# Patient Record
Sex: Male | Born: 1950 | Race: White | Hispanic: No | Marital: Married | Smoking: Former smoker
Health system: Southern US, Community
[De-identification: ages and names within clinical notes are randomized; demographics above are authoritative.]

## PROBLEM LIST (undated history)

## (undated) DIAGNOSIS — I10 Essential (primary) hypertension: Secondary | ICD-10-CM

## (undated) DIAGNOSIS — G610 Guillain-Barre syndrome: Secondary | ICD-10-CM

## (undated) DIAGNOSIS — Z8719 Personal history of other diseases of the digestive system: Secondary | ICD-10-CM

## (undated) DIAGNOSIS — K219 Gastro-esophageal reflux disease without esophagitis: Secondary | ICD-10-CM

## (undated) DIAGNOSIS — I499 Cardiac arrhythmia, unspecified: Secondary | ICD-10-CM

## (undated) DIAGNOSIS — M1990 Unspecified osteoarthritis, unspecified site: Secondary | ICD-10-CM

## (undated) DIAGNOSIS — J302 Other seasonal allergic rhinitis: Secondary | ICD-10-CM

## (undated) HISTORY — PX: APPENDECTOMY: SHX54

## (undated) HISTORY — PX: CERVICAL FUSION: SHX112

---

## 2013-04-07 ENCOUNTER — Ambulatory Visit: Payer: 59 | Attending: *Deleted | Admitting: Physical Therapy

## 2013-04-07 DIAGNOSIS — R5381 Other malaise: Secondary | ICD-10-CM | POA: Insufficient documentation

## 2013-04-07 DIAGNOSIS — R293 Abnormal posture: Secondary | ICD-10-CM | POA: Insufficient documentation

## 2013-04-07 DIAGNOSIS — IMO0001 Reserved for inherently not codable concepts without codable children: Secondary | ICD-10-CM | POA: Insufficient documentation

## 2013-04-07 DIAGNOSIS — Z981 Arthrodesis status: Secondary | ICD-10-CM | POA: Insufficient documentation

## 2013-04-10 ENCOUNTER — Encounter: Payer: 59 | Admitting: Physical Therapy

## 2013-04-17 ENCOUNTER — Other Ambulatory Visit: Payer: Self-pay | Admitting: Neurosurgery

## 2013-04-17 ENCOUNTER — Encounter (HOSPITAL_COMMUNITY): Payer: Self-pay | Admitting: Pharmacy Technician

## 2013-04-21 ENCOUNTER — Encounter (HOSPITAL_COMMUNITY)
Admission: RE | Admit: 2013-04-21 | Discharge: 2013-04-21 | Disposition: A | Discharge status: Home / Self Care | Payer: 59 | Source: Ambulatory Visit | Attending: Neurosurgery | Admitting: Neurosurgery

## 2013-04-21 ENCOUNTER — Encounter (HOSPITAL_COMMUNITY): Payer: Self-pay

## 2013-04-21 HISTORY — DX: Other seasonal allergic rhinitis: J30.2

## 2013-04-21 HISTORY — DX: Guillain-Barre syndrome: G61.0

## 2013-04-21 HISTORY — DX: Gastro-esophageal reflux disease without esophagitis: K21.9

## 2013-04-21 HISTORY — DX: Unspecified osteoarthritis, unspecified site: M19.90

## 2013-04-21 LAB — SURGICAL PCR SCREEN: Staphylococcus aureus: POSITIVE — AB

## 2013-04-21 LAB — CBC
HCT: 43.2 % (ref 39.0–52.0)
Hemoglobin: 15.2 g/dL (ref 13.0–17.0)
MCHC: 35.2 g/dL (ref 30.0–36.0)
MCV: 81.2 fL (ref 78.0–100.0)

## 2013-04-21 LAB — BASIC METABOLIC PANEL
BUN: 16 mg/dL (ref 6–23)
Creatinine, Ser: 1.16 mg/dL (ref 0.50–1.35)
GFR calc non Af Amer: 67 mL/min — ABNORMAL LOW (ref >90)
Glucose, Bld: 113 mg/dL — ABNORMAL HIGH (ref 70–99)
Potassium: 3.7 mEq/L (ref 3.5–5.1)

## 2013-04-21 NOTE — Progress Notes (Signed)
04/21/13 1122  OBSTRUCTIVE SLEEP APNEA  Score 4 or greater  Results sent to PCP   This patient has screened at an elevated risk for obstructive sleep apnea using the STOP Bang tool during a pre-surgical visit. A score of 4 or greater is an elevated risk for sleep apnea.

## 2013-04-21 NOTE — Pre-Procedure Instructions (Signed)
Homer Miller  04/21/2013   Your procedure is scheduled on:  Wednesday Apr 23, 2013.  Report to [facility] 3rd Floor at 11:00 AM.  Call this number if you have problems the morning of surgery: [phone]   Remember:   Do not eat food or drink liquids after midnight.   Take these medicines the morning of surgery with A SIP OF WATER: Hydrocodone (Vicodin) if needed for pain, Omeprazole (Prilosec), Loratadine (Claritin)   Do not wear jewelry  Do not wear lotions or colognes.  Men may shave face and neck.  Do not bring valuables to the hospital.  Contacts, dentures or bridgework may not be worn into surgery.  Leave suitcase in the car. After surgery it may be brought to your room.  For patients admitted to the hospital, checkout time is 11:00 AM the day of discharge.   Patients discharged the day of surgery will not be allowed to drive home.  Name and phone number of your driver: Family/Friend  Special Instructions: Shower using CHG 2 nights before surgery and the night before surgery.  If you shower the day of surgery use CHG.  Use special wash - you have one bottle of CHG for all showers.  You should use approximately 1/3 of the bottle for each shower.   Please read over the following fact sheets that you were given: Pain Booklet, Coughing and Deep Breathing, MRSA Information and Surgical Site Infection Prevention

## 2013-04-21 NOTE — Progress Notes (Signed)
Nurse called [facility] and spoke with Erie Noe and informed her that orders were under signed and held, and that the patient had a penicillin allergy and had a rash the last time it was taken. Erie Noe said that antibiotic needed to be changed to 1g Vancomycin and she would give the message to Dr. Jeral Fruit.

## 2013-04-23 ENCOUNTER — Inpatient Hospital Stay (HOSPITAL_COMMUNITY)
Admission: RE | Admit: 2013-04-23 | Discharge: 2013-04-26 | DRG: 460 | Disposition: A | Discharge status: Home / Self Care | Payer: 59 | Source: Ambulatory Visit | Attending: Neurosurgery | Admitting: Neurosurgery

## 2013-04-23 ENCOUNTER — Encounter (HOSPITAL_COMMUNITY): Payer: Self-pay | Admitting: Certified Registered"

## 2013-04-23 ENCOUNTER — Encounter (HOSPITAL_COMMUNITY)
Admission: RE | Disposition: A | Discharge status: Home / Self Care | Payer: Self-pay | Source: Ambulatory Visit | Attending: Neurosurgery

## 2013-04-23 ENCOUNTER — Encounter (HOSPITAL_COMMUNITY): Payer: Self-pay | Admitting: *Deleted

## 2013-04-23 ENCOUNTER — Ambulatory Visit (HOSPITAL_COMMUNITY): Payer: 59

## 2013-04-23 ENCOUNTER — Ambulatory Visit (HOSPITAL_COMMUNITY): Payer: 59 | Admitting: Certified Registered"

## 2013-04-23 DIAGNOSIS — G610 Guillain-Barre syndrome: Secondary | ICD-10-CM | POA: Diagnosis present

## 2013-04-23 DIAGNOSIS — M47812 Spondylosis without myelopathy or radiculopathy, cervical region: Principal | ICD-10-CM | POA: Diagnosis present

## 2013-04-23 DIAGNOSIS — K219 Gastro-esophageal reflux disease without esophagitis: Secondary | ICD-10-CM | POA: Diagnosis present

## 2013-04-23 DIAGNOSIS — Z87891 Personal history of nicotine dependence: Secondary | ICD-10-CM

## 2013-04-23 DIAGNOSIS — G950 Syringomyelia and syringobulbia: Secondary | ICD-10-CM | POA: Diagnosis present

## 2013-04-23 HISTORY — PX: ANTERIOR CERVICAL DECOMP/DISCECTOMY FUSION: SHX1161

## 2013-04-23 SURGERY — ANTERIOR CERVICAL DECOMPRESSION/DISCECTOMY FUSION 3 LEVELS
Anesthesia: General | Site: Neck | Wound class: Clean

## 2013-04-23 MED ORDER — HYDROMORPHONE HCL PF 1 MG/ML IJ SOLN
0.2500 mg | INTRAMUSCULAR | Status: DC | PRN
  Administered 2013-04-23: 0.5 mg via INTRAVENOUS

## 2013-04-23 MED ORDER — ACETAMINOPHEN 650 MG RE SUPP: 650.0000 mg | RECTAL | Status: DC | PRN

## 2013-04-23 MED ORDER — MIDAZOLAM HCL 5 MG/5ML IJ SOLN
INTRAMUSCULAR | Status: DC | PRN
  Administered 2013-04-23: 2 mg via INTRAVENOUS

## 2013-04-23 MED ORDER — VANCOMYCIN HCL IN DEXTROSE 1-5 GM/200ML-% IV SOLN
INTRAVENOUS | Status: AC
  Administered 2013-04-23: 1000 mg via INTRAVENOUS
  Filled 2013-04-23: qty 200

## 2013-04-23 MED ORDER — HYDROMORPHONE HCL PF 1 MG/ML IJ SOLN
INTRAMUSCULAR | Status: AC
  Administered 2013-04-23: 0.5 mg via INTRAVENOUS
  Filled 2013-04-23: qty 1

## 2013-04-23 MED ORDER — ROCURONIUM BROMIDE 100 MG/10ML IV SOLN
INTRAVENOUS | Status: DC | PRN
  Administered 2013-04-23: 50 mg via INTRAVENOUS

## 2013-04-23 MED ORDER — THROMBIN 5000 UNITS EX SOLR
OROMUCOSAL | Status: DC | PRN
  Administered 2013-04-23 (×2): via TOPICAL

## 2013-04-23 MED ORDER — PHENYLEPHRINE HCL 10 MG/ML IJ SOLN
INTRAMUSCULAR | Status: DC | PRN
  Administered 2013-04-23: 40 ug via INTRAVENOUS
  Administered 2013-04-23: 80 ug via INTRAVENOUS

## 2013-04-23 MED ORDER — SUFENTANIL CITRATE 50 MCG/ML IV SOLN
INTRAVENOUS | Status: DC | PRN
  Administered 2013-04-23: 10 ug via INTRAVENOUS
  Administered 2013-04-23: 20 ug via INTRAVENOUS
  Administered 2013-04-23: 10 ug via INTRAVENOUS

## 2013-04-23 MED ORDER — PHENOL 1.4 % MT LIQD: 1.0000 | OROMUCOSAL | Status: DC | PRN

## 2013-04-23 MED ORDER — DEXAMETHASONE 4 MG PO TABS
4.0000 mg | ORAL_TABLET | Freq: Four times a day (QID) | ORAL | Status: DC
  Administered 2013-04-24 – 2013-04-26 (×7): 4 mg via ORAL
  Filled 2013-04-23 (×15): qty 1

## 2013-04-23 MED ORDER — ZOLPIDEM TARTRATE 5 MG PO TABS: 5.0000 mg | ORAL_TABLET | Freq: Every evening | ORAL | Status: DC | PRN

## 2013-04-23 MED ORDER — OXYCODONE HCL 5 MG/5ML PO SOLN: 5.0000 mg | Freq: Once | ORAL | Status: DC | PRN

## 2013-04-23 MED ORDER — SODIUM CHLORIDE 0.9 % IJ SOLN
3.0000 mL | Freq: Two times a day (BID) | INTRAMUSCULAR | Status: DC
  Administered 2013-04-23 – 2013-04-26 (×4): 3 mL via INTRAVENOUS

## 2013-04-23 MED ORDER — LACTATED RINGERS IV SOLN
INTRAVENOUS | Status: DC | PRN
  Administered 2013-04-23 (×2): via INTRAVENOUS

## 2013-04-23 MED ORDER — NAPHAZOLINE-PHENIRAMINE 0.025-0.3 % OP SOLN
1.0000 [drp] | Freq: Every day | OPHTHALMIC | Status: DC | PRN
  Filled 2013-04-23: qty 15

## 2013-04-23 MED ORDER — OXYCODONE-ACETAMINOPHEN 5-325 MG PO TABS
1.0000 | ORAL_TABLET | ORAL | Status: DC | PRN
  Administered 2013-04-24 – 2013-04-26 (×11): 2 via ORAL
  Filled 2013-04-23 (×12): qty 2

## 2013-04-23 MED ORDER — VANCOMYCIN HCL IN DEXTROSE 1-5 GM/200ML-% IV SOLN
1000.0000 mg | Freq: Two times a day (BID) | INTRAVENOUS | Status: DC
  Administered 2013-04-24 – 2013-04-26 (×5): 1000 mg via INTRAVENOUS
  Filled 2013-04-23 (×7): qty 200

## 2013-04-23 MED ORDER — SODIUM CHLORIDE 0.9 % IV SOLN
INTRAVENOUS | Status: DC
  Administered 2013-04-23: 22:00:00 via INTRAVENOUS

## 2013-04-23 MED ORDER — ALUM & MAG HYDROXIDE-SIMETH 200-200-20 MG/5ML PO SUSP: 30.0000 mL | Freq: Four times a day (QID) | ORAL | Status: DC | PRN

## 2013-04-23 MED ORDER — SODIUM CHLORIDE 0.9 % IJ SOLN: 3.0000 mL | INTRAMUSCULAR | Status: DC | PRN

## 2013-04-23 MED ORDER — VECURONIUM BROMIDE 10 MG IV SOLR
INTRAVENOUS | Status: DC | PRN
  Administered 2013-04-23 (×2): 1 mg via INTRAVENOUS
  Administered 2013-04-23: 2 mg via INTRAVENOUS

## 2013-04-23 MED ORDER — ONDANSETRON HCL 4 MG/2ML IJ SOLN
INTRAMUSCULAR | Status: DC | PRN
  Administered 2013-04-23: 4 mg via INTRAVENOUS

## 2013-04-23 MED ORDER — MENTHOL 3 MG MT LOZG
1.0000 | LOZENGE | OROMUCOSAL | Status: DC | PRN
  Administered 2013-04-24: 3 mg via ORAL
  Filled 2013-04-23: qty 9

## 2013-04-23 MED ORDER — LIDOCAINE HCL (CARDIAC) 20 MG/ML IV SOLN
INTRAVENOUS | Status: DC | PRN
  Administered 2013-04-23: 50 mg via INTRAVENOUS

## 2013-04-23 MED ORDER — LORATADINE 10 MG PO TABS
10.0000 mg | ORAL_TABLET | Freq: Every day | ORAL | Status: DC
  Administered 2013-04-26: 10 mg via ORAL
  Filled 2013-04-23 (×4): qty 1

## 2013-04-23 MED ORDER — SODIUM CHLORIDE 0.9 % IV SOLN: 250.0000 mL | INTRAVENOUS | Status: DC

## 2013-04-23 MED ORDER — THROMBIN 20000 UNITS EX SOLR
CUTANEOUS | Status: DC | PRN
  Administered 2013-04-23: 14:00:00 via TOPICAL

## 2013-04-23 MED ORDER — CEFAZOLIN SODIUM 1-5 GM-% IV SOLN
1.0000 g | Freq: Three times a day (TID) | INTRAVENOUS | Status: AC
  Administered 2013-04-23 – 2013-04-24 (×2): 1 g via INTRAVENOUS
  Filled 2013-04-23 (×2): qty 50

## 2013-04-23 MED ORDER — DIAZEPAM 5 MG PO TABS: 5.0000 mg | ORAL_TABLET | Freq: Four times a day (QID) | ORAL | Status: DC | PRN

## 2013-04-23 MED ORDER — ONDANSETRON HCL 4 MG/2ML IJ SOLN: 4.0000 mg | INTRAMUSCULAR | Status: DC | PRN

## 2013-04-23 MED ORDER — DEXAMETHASONE SODIUM PHOSPHATE 4 MG/ML IJ SOLN
4.0000 mg | Freq: Four times a day (QID) | INTRAMUSCULAR | Status: DC
  Administered 2013-04-23 – 2013-04-25 (×5): 4 mg via INTRAVENOUS
  Filled 2013-04-23 (×12): qty 1

## 2013-04-23 MED ORDER — MORPHINE SULFATE 2 MG/ML IJ SOLN
1.0000 mg | INTRAMUSCULAR | Status: DC | PRN
  Administered 2013-04-23: 4 mg via INTRAVENOUS
  Administered 2013-04-24: 2 mg via INTRAVENOUS
  Administered 2013-04-24: 4 mg via INTRAVENOUS
  Filled 2013-04-23: qty 2
  Filled 2013-04-23: qty 1
  Filled 2013-04-23: qty 2

## 2013-04-23 MED ORDER — 0.9 % SODIUM CHLORIDE (POUR BTL) OPTIME
TOPICAL | Status: DC | PRN
  Administered 2013-04-23: 1000 mL

## 2013-04-23 MED ORDER — NEOSTIGMINE METHYLSULFATE 1 MG/ML IJ SOLN
INTRAMUSCULAR | Status: DC | PRN
  Administered 2013-04-23: 3 mg via INTRAVENOUS

## 2013-04-23 MED ORDER — PROPOFOL 10 MG/ML IV BOLUS
INTRAVENOUS | Status: DC | PRN
  Administered 2013-04-23: 60 mg via INTRAVENOUS
  Administered 2013-04-23: 140 mg via INTRAVENOUS

## 2013-04-23 MED ORDER — ACETAMINOPHEN 325 MG PO TABS: 650.0000 mg | ORAL_TABLET | ORAL | Status: DC | PRN

## 2013-04-23 MED ORDER — GLYCOPYRROLATE 0.2 MG/ML IJ SOLN
INTRAMUSCULAR | Status: DC | PRN
  Administered 2013-04-23: 0.4 mg via INTRAVENOUS

## 2013-04-23 MED ORDER — OXYCODONE HCL 5 MG PO TABS: 5.0000 mg | ORAL_TABLET | Freq: Once | ORAL | Status: DC | PRN

## 2013-04-23 SURGICAL SUPPLY — 70 items
BANDAGE GAUZE ELAST BULKY 4 IN (GAUZE/BANDAGES/DRESSINGS) ×4 IMPLANT
BENZOIN TINCTURE PRP APPL 2/3 (GAUZE/BANDAGES/DRESSINGS) ×2 IMPLANT
BLADE ULTRA TIP 2M (BLADE) ×2 IMPLANT
BUR BARREL STRAIGHT FLUTE 4.0 (BURR) ×2 IMPLANT
BUR MATCHSTICK NEURO 3.0 LAGG (BURR) ×2 IMPLANT
CANISTER SUCTION 2500CC (MISCELLANEOUS) ×2 IMPLANT
CLOTH BEACON ORANGE TIMEOUT ST (SAFETY) ×2 IMPLANT
CONT SPEC 4OZ CLIKSEAL STRL BL (MISCELLANEOUS) ×2 IMPLANT
COVER MAYO STAND STRL (DRAPES) ×2 IMPLANT
DRAPE C-ARM 42X72 X-RAY (DRAPES) ×4 IMPLANT
DRAPE LAPAROTOMY 100X72 PEDS (DRAPES) ×2 IMPLANT
DRAPE MICROSCOPE LEICA (MISCELLANEOUS) ×2 IMPLANT
DRAPE POUCH INSTRU U-SHP 10X18 (DRAPES) ×2 IMPLANT
DRAPE PROXIMA HALF (DRAPES) ×2 IMPLANT
DURAPREP 6ML APPLICATOR 50/CS (WOUND CARE) ×2 IMPLANT
ELECT REM PT RETURN 9FT ADLT (ELECTROSURGICAL) ×2
ELECTRODE REM PT RTRN 9FT ADLT (ELECTROSURGICAL) ×1 IMPLANT
EVACUATOR 3/16  PVC DRAIN (DRAIN) ×1
EVACUATOR 3/16 PVC DRAIN (DRAIN) ×1 IMPLANT
GAUZE SPONGE 4X4 16PLY XRAY LF (GAUZE/BANDAGES/DRESSINGS) IMPLANT
GLOVE BIO SURGEON STRL SZ 6.5 (GLOVE) IMPLANT
GLOVE BIO SURGEON STRL SZ7 (GLOVE) IMPLANT
GLOVE BIO SURGEON STRL SZ7.5 (GLOVE) IMPLANT
GLOVE BIO SURGEON STRL SZ8 (GLOVE) IMPLANT
GLOVE BIO SURGEON STRL SZ8.5 (GLOVE) IMPLANT
GLOVE BIOGEL M 8.0 STRL (GLOVE) ×4 IMPLANT
GLOVE BIOGEL PI IND STRL 7.0 (GLOVE) ×1 IMPLANT
GLOVE BIOGEL PI INDICATOR 7.0 (GLOVE) ×1
GLOVE ECLIPSE 6.5 STRL STRAW (GLOVE) IMPLANT
GLOVE ECLIPSE 7.0 STRL STRAW (GLOVE) IMPLANT
GLOVE ECLIPSE 7.5 STRL STRAW (GLOVE) IMPLANT
GLOVE ECLIPSE 8.0 STRL XLNG CF (GLOVE) IMPLANT
GLOVE ECLIPSE 8.5 STRL (GLOVE) IMPLANT
GLOVE EXAM NITRILE LRG STRL (GLOVE) IMPLANT
GLOVE EXAM NITRILE MD LF STRL (GLOVE) IMPLANT
GLOVE EXAM NITRILE XL STR (GLOVE) IMPLANT
GLOVE EXAM NITRILE XS STR PU (GLOVE) IMPLANT
GLOVE INDICATOR 6.5 STRL GRN (GLOVE) IMPLANT
GLOVE INDICATOR 7.0 STRL GRN (GLOVE) IMPLANT
GLOVE INDICATOR 7.5 STRL GRN (GLOVE) IMPLANT
GLOVE INDICATOR 8.0 STRL GRN (GLOVE) IMPLANT
GLOVE INDICATOR 8.5 STRL (GLOVE) IMPLANT
GLOVE OPTIFIT SS 8.0 STRL (GLOVE) IMPLANT
GLOVE SURG SS PI 6.5 STRL IVOR (GLOVE) IMPLANT
GLOVE SURG SS PI 7.0 STRL IVOR (GLOVE) ×6 IMPLANT
GOWN BRE IMP SLV AUR LG STRL (GOWN DISPOSABLE) IMPLANT
GOWN BRE IMP SLV AUR XL STRL (GOWN DISPOSABLE) ×6 IMPLANT
GOWN STRL REIN 2XL LVL4 (GOWN DISPOSABLE) IMPLANT
HEAD HALTER (SOFTGOODS) ×2 IMPLANT
HEMOSTAT POWDER KIT SURGIFOAM (HEMOSTASIS) ×4 IMPLANT
KIT BASIN OR (CUSTOM PROCEDURE TRAY) ×2 IMPLANT
KIT ROOM TURNOVER OR (KITS) ×2 IMPLANT
NEEDLE SPNL 22GX3.5 QUINCKE BK (NEEDLE) ×4 IMPLANT
NS IRRIG 1000ML POUR BTL (IV SOLUTION) ×2 IMPLANT
PACK LAMINECTOMY NEURO (CUSTOM PROCEDURE TRAY) ×2 IMPLANT
PATTIES SURGICAL .5 X1 (DISPOSABLE) ×2 IMPLANT
PLATE ANT CERV XTEND 3 LV 54 (Plate) ×2 IMPLANT
RUBBERBAND STERILE (MISCELLANEOUS) ×4 IMPLANT
SCREW XTD VAR 4.2 SELF TAP 12 (Screw) ×12 IMPLANT
SPACER ACF 7MM (Bone Implant) ×6 IMPLANT
SPONGE GAUZE 4X4 12PLY (GAUZE/BANDAGES/DRESSINGS) ×2 IMPLANT
SPONGE INTESTINAL PEANUT (DISPOSABLE) ×4 IMPLANT
SPONGE SURGIFOAM ABS GEL 100 (HEMOSTASIS) ×2 IMPLANT
STRIP CLOSURE SKIN 1/2X4 (GAUZE/BANDAGES/DRESSINGS) ×2 IMPLANT
SUT VIC AB 3-0 SH 8-18 (SUTURE) ×4 IMPLANT
SYR 20ML ECCENTRIC (SYRINGE) ×2 IMPLANT
TAPE CLOTH SURG 4X10 WHT LF (GAUZE/BANDAGES/DRESSINGS) ×2 IMPLANT
TOWEL OR 17X24 6PK STRL BLUE (TOWEL DISPOSABLE) ×2 IMPLANT
TOWEL OR 17X26 10 PK STRL BLUE (TOWEL DISPOSABLE) ×2 IMPLANT
WATER STERILE IRR 1000ML POUR (IV SOLUTION) ×2 IMPLANT

## 2013-04-23 NOTE — Anesthesia Postprocedure Evaluation (Signed)
  Anesthesia Post-op Note  Patient: Kennan Detter  Procedure(s) Performed: Procedure(s) with comments: ANTERIOR CERVICAL DECOMPRESSION/DISCECTOMY FUSION 3 LEVELS (N/A) - Cervical three-four, cervical four-five, cervical five-six Anterior cervical decompression/diskectomy/fusion  Patient Location: PACU  Anesthesia Type:General  Level of Consciousness: awake, alert , oriented and patient cooperative  Airway and Oxygen Therapy: Patient Spontanous Breathing and Patient connected to nasal cannula oxygen  Post-op Pain: mild  Post-op Assessment: Post-op Vital signs reviewed, Patient's Cardiovascular Status Stable, Respiratory Function Stable, Patent Airway, No signs of Nausea or vomiting and Pain level controlled  Post-op Vital Signs: Reviewed and stable  Complications: No apparent anesthesia complications

## 2013-04-23 NOTE — H&P (Signed)
Bradley Trujillo is an 62 y.o. male.   Chief Complaint: tingling sensation in both arms while at work HPI: patient with a history of sudden onset of burning sensation in both upper extremities while he was working while looking up. Now he notices than everytime he hyperextends his neck he develops tingling feeligs which gets better with flexion the cervical spine.   Past Medical History  Diagnosis Date  . Arthritis   . Seasonal allergies   . GERD (gastroesophageal reflux disease)   . Guillain-Barre syndrome     Past Surgical History  Procedure Laterality Date  . Cervical fusion      C7  . Appendectomy      History reviewed. No pertinent family history. Social History:  reports that he has quit smoking. He does not have any smokeless tobacco history on file. He reports that he drinks about 0.6 ounces of alcohol per week. He reports that he does not use illicit drugs.  Allergies:  Allergies  Allergen Reactions  . Penicillins Rash    Medications Prior to Admission  Medication Sig Dispense Refill  . celecoxib (CELEBREX) 200 MG capsule Take 200 mg by mouth daily.      . Cholecalciferol (VITAMIN D3) 2000 UNITS capsule Take 2,000 Units by mouth daily.      Marland Kitchen HYDROcodone-acetaminophen (VICODIN) 5-500 MG per tablet Take 0.5 tablets by mouth 2 (two) times daily as needed for pain. For pain      . loratadine (CLARITIN) 10 MG tablet Take 10 mg by mouth daily.      . Multiple Vitamin (MULTIVITAMIN WITH MINERALS) TABS Take 1 tablet by mouth daily.      . naphazoline-pheniramine (NAPHCON-A) 0.025-0.3 % ophthalmic solution Place 1 drop into both eyes daily as needed. For irritated eyes      . omeprazole (PRILOSEC) 20 MG capsule Take 20 mg by mouth daily.      Marland Kitchen oxymetazoline (DRISTAN SPRAY) 0.05 % nasal spray Place 1 spray into the nose 2 (two) times daily as needed for congestion. For nasal congestion        No results found for this or any previous visit (from the past 48 hour(s)). No results  found.  Review of Systems  Constitutional: Negative.   HENT: Negative.   Eyes: Negative.   Respiratory: Negative.   Cardiovascular: Negative.   Gastrointestinal: Negative.   Genitourinary: Negative.   Musculoskeletal: Negative.   Skin: Negative.   Neurological: Positive for focal weakness.  Endo/Heme/Allergies: Negative.   Psychiatric/Behavioral: Negative.     Blood pressure 139/97, pulse 89, temperature 97.9 F (36.6 C), temperature source Oral, resp. rate 20, SpO2 97.00%. Physical Exam hent, nl. Neck, he has an anterior scar from previous fusin at c6-7. Cv, nl. Lungs, clear. Abdomen, soft. Extremities, atrophy of both lower extremities secondary to Guilliam-Brre syndrome. Braces in both.NEURO  Weakness of both biceps and deltoids, atrophy of hypotenar muscle on the left. Dtr, nl. Mri shows gliosis at c4-5, stenosis from c3 to c5, fusion at 67  Assessment/Plan Decompression and fusion at c3 to 6. He is aware of therisks such [place] no improvement, worsening of the weakness, csf leak, hematoma, damage to the cord and esophagus. Also aware of the possibility of posterior decompression at a later day Jalal Rauch M 04/23/2013, 1:40 PM

## 2013-04-23 NOTE — Progress Notes (Signed)
Op note [phone]

## 2013-04-23 NOTE — Preoperative (Signed)
Beta Blockers   Reason not to administer Beta Blockers:Not Applicable 

## 2013-04-23 NOTE — Progress Notes (Signed)
Patient states he needs to have shoes with braces built in before he can ambulate due to foot drop. Shoes and braces are at bedside.

## 2013-04-23 NOTE — Progress Notes (Signed)
ANTIBIOTIC CONSULT NOTE - INITIAL  Pharmacy Consult for Vancomycin Indication: Surgical prophylaxis  Allergies  Allergen Reactions  . Penicillins Rash    Patient Measurements: Height: 5' 6.93" (170 cm) Weight: 178 lb 5.6 oz (80.9 kg) IBW/kg (Calculated) : 65.94   Vital Signs: Temp: 98.5 F (36.9 C) (05/07 2034) Temp src: Oral (05/07 2034) BP: 132/81 mmHg (05/07 2034) Pulse Rate: 104 (05/07 2034)   Recent Labs  04/21/13 1136  WBC 5.5  HGB 15.2  PLT 201  CREATININE 1.16   Estimated Creatinine Clearance: 68.9 ml/min (by C-G formula based on Cr of 1.16).   Medical History: Past Medical History  Diagnosis Date  . Arthritis   . Seasonal allergies   . GERD (gastroesophageal reflux disease)   . Guillain-Barre syndrome      Assessment: 62yo male being continued on IV vancomycin for post-op surgical prophylaxis s/p anterior cervical decompression/ discectomy fusion 3 levels earlier today. Received Vancomycin 1000mg  x 1 this afternoon. Wt 80.9 kg, CrCl ~60ml/min, WBC 5.5, Tmax 100'F.    Plan:  1) Vancomycin 1000mg  IV Q12 hours 2) F/u renal function, clinical status, LOT, Vancomycin trough at steady state as appropriate   Benjaman Pott, PharmD, BCPS 04/23/2013   8:57 PM

## 2013-04-23 NOTE — Anesthesia Procedure Notes (Addendum)
Procedure Name: Intubation Date/Time: 04/23/2013 2:44 PM Performed by: Charm Barges, Tationna Fullard R Pre-anesthesia Checklist: Patient identified, Emergency Drugs available, Suction available, Patient being monitored and Timeout performed Patient Re-evaluated:Patient Re-evaluated prior to inductionOxygen Delivery Method: Circle system utilized Preoxygenation: Pre-oxygenation with 100% oxygen Intubation Type: IV induction Ventilation: Mask ventilation without difficulty Grade View: Grade I Tube type: Oral Number of attempts: 1 Airway Equipment and Method: Rigid stylet and Video-laryngoscopy (Neck remained neutral thoughout masking and intubation.) Placement Confirmation: ETT inserted through vocal cords under direct vision,  positive ETCO2 and breath sounds checked- equal and bilateral Secured at: 22 cm Tube secured with: Tape Dental Injury: Teeth and Oropharynx as per pre-operative assessment

## 2013-04-23 NOTE — Transfer of Care (Signed)
Immediate Anesthesia Transfer of Care Note  Patient: Bradley Trujillo  Procedure(s) Performed: Procedure(s) with comments: ANTERIOR CERVICAL DECOMPRESSION/DISCECTOMY FUSION 3 LEVELS (N/A) - Cervical three-four, cervical four-five, cervical five-six Anterior cervical decompression/diskectomy/fusion  Patient Location: PACU  Anesthesia Type:General  Level of Consciousness: awake, alert  and oriented  Airway & Oxygen Therapy: Patient Spontanous Breathing and Patient connected to nasal cannula oxygen  Post-op Assessment: Report given to PACU RN, Post -op Vital signs reviewed and stable and Patient moving all extremities  Post vital signs: Reviewed and stable  Complications: No apparent anesthesia complications

## 2013-04-23 NOTE — Anesthesia Preprocedure Evaluation (Signed)
Anesthesia Evaluation  Patient identified by MRN, date of birth, ID band Patient awake    Reviewed: Allergy & Precautions, H&P , NPO status , Patient's Chart, lab work & pertinent test results  Airway Mallampati: II TM Distance: >3 FB Neck ROM: Full    Dental no notable dental hx. (+) Edentulous Upper, Edentulous Lower and Dental Advisory Given   Pulmonary neg pulmonary ROS,  breath sounds clear to auscultation  Pulmonary exam normal       Cardiovascular negative cardio ROS  Rhythm:Regular Rate:Normal     Neuro/Psych  Neuromuscular disease negative psych ROS   GI/Hepatic Neg liver ROS, GERD-  Medicated and Controlled,  Endo/Other  negative endocrine ROS  Renal/GU negative Renal ROS  negative genitourinary   Musculoskeletal   Abdominal   Peds  Hematology negative hematology ROS (+)   Anesthesia Other Findings   Reproductive/Obstetrics negative OB ROS                           Anesthesia Physical Anesthesia Plan  ASA: II  Anesthesia Plan: General   Post-op Pain Management:    Induction: Intravenous  Airway Management Planned: Oral ETT  Additional Equipment:   Intra-op Plan:   Post-operative Plan: Extubation in OR  Informed Consent: I have reviewed the patients History and Physical, chart, labs and discussed the procedure including the risks, benefits and alternatives for the proposed anesthesia with the patient or authorized representative who has indicated his/her understanding and acceptance.   Dental advisory given  Plan Discussed with: CRNA and Surgeon  Anesthesia Plan Comments:         Anesthesia Quick Evaluation

## 2013-04-24 DIAGNOSIS — M4712 Other spondylosis with myelopathy, cervical region: Secondary | ICD-10-CM

## 2013-04-24 NOTE — Progress Notes (Signed)
Nutrition Brief Note  Patient identified on the Malnutrition Screening Tool (MST) Report  Pt denies any recent weight changes. Reports good appetite PTA.   Body mass index is 27.99 kg/(m^2). Patient meets criteria for overweight based on current BMI.   Current diet order is Regular. Labs and medications reviewed.   No nutrition interventions warranted at this time. If nutrition issues arise, please consult RD.   Kendell Bane RD, LDN, CNSC [phone] Pager [phone] After Hours Pager

## 2013-04-24 NOTE — Progress Notes (Signed)
UR COMPLETED  

## 2013-04-24 NOTE — Clinical Social Work Note (Signed)
Clinical Social Work   CSW received consult for SNF. CSW reviewed chart and discussed pt with RN during progression. Awaiting PT/OT evals for discharge recommendations. PT/OT evals are needed for prior auth for SNF placement. CSW will assess for SNF, if appropriate. CSW will continue to follow.   Dede Query, MSW, LCSW [phone]

## 2013-04-24 NOTE — Progress Notes (Signed)
Physical Therapy Evaluation Patient Details Name: Bradley Trujillo MRN: [id number] DOB: 1951/05/09 Today's Date: 04/24/2013 Time:  -     PT Assessment / Plan / Recommendation Clinical Impression  Educated pt and spouse on proper use of C collar.  No orders in chart for frequency of  C collar usage.  Pt's spouse had several queistion about bed mobility in a standard flat bed.  Provide handouts and explaniation of technique to pt and spouse. Review cervical precautions with pt.  No further acute or follow-up PT needs.  Acute PT signing off.       PT Assessment  Patent does not need any further PT services    Follow Up Recommendations  No PT follow up    Does the patient have the potential to tolerate intense rehabilitation      Barriers to Discharge        Equipment Recommendations  None recommended by PT    Recommendations for Other Services     Frequency      Precautions / Restrictions Precautions Precautions: Cervical Precaution Comments: Educated pt and wife on cervical precautions.   Pertinent Vitals/Pain Pt reporting 8/10 pain in neck.  RN notified./        Mobility  Bed Mobility Bed Mobility: Supine to Sit;Sitting - Scoot to Edge of Bed Supine to Sit: Other (comment) Sitting - Scoot to Edge of Bed: 6: Modified independent (Device/Increase time) Details for Bed Mobility Assistance: instructed pt and spouse on proper techniques for bed mobility to maintain cervical precautions. Pt Declined practice at this time stating that he was planning to sleep in his recliner for a few weeks at home.  Transfers Transfers: Not assessed Sit to Stand: 6: Modified independent (Device/Increase time);From bed;With upper extremity assist Stand to Sit: 6: Modified independent (Device/Increase time);With upper extremity assist;To bed Ambulation/Gait Ambulation/Gait Assistance: Not tested (comment) Stairs: No Wheelchair Mobility Wheelchair Mobility: No    Exercises     PT Diagnosis:     PT Problem List:   PT Treatment Interventions:     PT Goals    Visit Information  Last PT Received On: 04/24/13 Assistance Needed: +1    Subjective Data      Prior Functioning  Home Living Lives With: Spouse Available Help at Discharge: Family;Available 24 hours/day Type of Home: House Home Access: Stairs to enter [employer] of Steps: 1 Home Layout: One level Bathroom Shower/Tub: Engineer, manufacturing systems: Standard Home Adaptive Equipment: Environmental consultant - rolling;Straight cane Additional Comments: can borrow shower chair if needed Prior Function Level of Independence: Independent Communication Communication: No difficulties Dominant Hand: Right    Cognition  Cognition Arousal/Alertness: Awake/alert Behavior During Therapy: WFL for tasks assessed/performed Overall Cognitive Status: Within Functional Limits for tasks assessed    Extremity/Trunk Assessment Right Upper Extremity Assessment RUE ROM/Strength/Tone: [facility] for tasks assessed;Unable to fully assess;Due to precautions (cervical precautions) RUE Sensation: Deficits RUE Sensation Deficits: tingling in finger tips RUE Coordination: WFL - gross/fine motor Left Upper Extremity Assessment LUE ROM/Strength/Tone: WFL for tasks assessed;Unable to fully assess;Due to precautions (cervical precautions) LUE Sensation: Deficits LUE Sensation Deficits: tingling in finger tips but states it has improved since sx LUE Coordination: WFL - gross/fine motor   Balance Balance Balance Assessed: No  End of Session    GP     Oluwaferanmi Wain 04/24/2013, 5:07 PM Homer Miller L. Courtenay Creger DPT [phone]

## 2013-04-24 NOTE — Evaluation (Signed)
Occupational Therapy Evaluation Patient Details Name: Bradley Trujillo MRN: [id number] DOB: September 01, 1951 Today's Date: 04/24/2013 Time: 1350-1430 OT Time Calculation (min): 40 min  OT Assessment / Plan / Recommendation Clinical Impression  Pt s/p ACDF.  Hx of Guillain-Barre syndrome.  Pt requiring min assist with LB ADLs but wife present and states she will be home to assist with ADLs.  Pt performing functional mobility at overall supervision/mod I level.  Education completed. No further acute OT needs.    OT Assessment  Patient does not need any further OT services    Follow Up Recommendations  No OT follow up;Supervision/Assistance - 24 hour    Barriers to Discharge      Equipment Recommendations  None recommended by OT    Recommendations for Other Services    Frequency       Precautions / Restrictions Precautions Precautions: Cervical Precaution Comments: Educated pt and wife on cervical precautions.   Pertinent Vitals/Pain See vitals    ADL  Eating/Feeding: Performed;Modified independent Where Assessed - Eating/Feeding: Edge of bed;Chair Grooming: Performed;Wash/dry hands;Modified independent Where Assessed - Grooming: Unsupported standing Upper Body Bathing: Simulated;Set up Where Assessed - Upper Body Bathing: Unsupported sitting Lower Body Bathing: Simulated;Minimal assistance Where Assessed - Lower Body Bathing: Unsupported sit to stand Upper Body Dressing: Performed;Set up Where Assessed - Upper Body Dressing: Unsupported sitting Lower Body Dressing: Simulated;Minimal assistance Where Assessed - Lower Body Dressing: Unsupported sit to stand Toilet Transfer: Simulated;Supervision/safety Toilet Transfer Method: Sit to Barista:  (bed to ambulate in hall, then return to chair) Equipment Used: Rolling walker (cervical collar) Transfers/Ambulation Related to ADLs: supervision with RW ambulating in hallway.   ADL Comments: Pt states he wears high  top boots due to his foot drop when he is outside and in community but does not wear them to ambulate around house.  Pt did not wear boots during ambualtion in room/hall with RW and demo'd safe mobility. Educated pt and wife on cervical precautions.  Pt able to cross ankles over knees with some difficutly.  Wife demonstrating independence in assist pt with ADLs. Educated pt and wife on collar donning/doffing, care, and repositioning.  Pt able to manipilate plastic packaging to unwrap throat lozenge (fine motor coordination intact). Pt able to stand at bedside and use urinal at mod I level and no LOB.     OT Diagnosis:    OT Problem List:   OT Treatment Interventions:     OT Goals    Visit Information  Last OT Received On: 04/24/13 Assistance Needed: +1    Subjective Data      Prior Functioning     Home Living Lives With: Spouse Available Help at Discharge: Family;Available 24 hours/day Type of Home: House Home Access: Stairs to enter [employer] of Steps: 1 Home Layout: One level Bathroom Shower/Tub: Engineer, manufacturing systems: Standard Home Adaptive Equipment: Environmental consultant - rolling;Straight cane Additional Comments: can borrow shower chair if needed Prior Function Level of Independence: Independent Communication Communication: No difficulties Dominant Hand: Right         Vision/Perception Vision - History Baseline Vision: Wears glasses all the time   Cognition  Cognition Arousal/Alertness: Awake/alert Behavior During Therapy: WFL for tasks assessed/performed Overall Cognitive Status: Within Functional Limits for tasks assessed    Extremity/Trunk Assessment Right Upper Extremity Assessment RUE ROM/Strength/Tone: [facility] for tasks assessed;Unable to fully assess;Due to precautions (cervical precautions) RUE Sensation: Deficits RUE Sensation Deficits: tingling in finger tips RUE Coordination: WFL - gross/fine motor Left Upper  Extremity Assessment LUE  ROM/Strength/Tone: [facility] for tasks assessed;Unable to fully assess;Due to precautions (cervical precautions) LUE Sensation: Deficits LUE Sensation Deficits: tingling in finger tips but states it has improved since sx LUE Coordination: WFL - gross/fine motor     Mobility Bed Mobility Bed Mobility: Supine to Sit;Sitting - Scoot to Edge of Bed Supine to Sit: 6: Modified independent (Device/Increase time);HOB elevated (HOB ~70 degrees with pillows) Sitting - Scoot to Edge of Bed: 6: Modified independent (Device/Increase time) Details for Bed Mobility Assistance: Pt in bed with HOB elevated and pillows positioned behind back.  Pt rotating hips to come to EOB rather than rolling. Educated pt on log roll technique but pt declining to practice.  States he plans to sleep in chair because he is unable to tolerate lying flat. Transfers Transfers: Sit to Stand;Stand to Sit Sit to Stand: 6: Modified independent (Device/Increase time);From bed;With upper extremity assist Stand to Sit: 6: Modified independent (Device/Increase time);With upper extremity assist;To bed     Exercise     Balance     End of Session OT - End of Session Equipment Utilized During Treatment: Cervical collar (RW) Activity Tolerance: Patient tolerated treatment well Patient left: in chair;with call bell/phone within reach;with family/visitor present Nurse Communication: Mobility status;Patient requests pain meds  GO    04/24/2013 Bradley Trujillo OTR/L Pager [phone] Office [phone]  Bradley Trujillo 04/24/2013, 2:56 PM

## 2013-04-24 NOTE — Progress Notes (Signed)
Patient ID: Bradley Trujillo, male   DOB: 08-17-1951, 63 y.o.   MRN: [id number] Doing well, more grip in hands. Decrease of the burning sensation. Drain working well. Rehab to see him

## 2013-04-24 NOTE — Progress Notes (Signed)
I await therapy evaluations to assist in detereming rehab needs and insurance approvals. [phone]

## 2013-04-24 NOTE — Consult Note (Signed)
Physical Medicine and Rehabilitation Consult Reason for Consult: ACDF/myelopathy Referring Physician: Dr. Jeral Fruit   HPI: Bradley Trujillo is a 62 y.o. 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 Admitted 04/23/2013 with progressive tingling sensation in upper extremities that was exacerbated when he hyperextends his neck. X-ray and imaging revealed cervical myelopathy C3-4, C4-5 and C5-6. Underwent anterior cervical decompression discectomy fusion at 3 levels 04/23/2013 per Dr. Jeral Fruit. Fitted with cervical collar. Postoperative pain management. Maintained on Decadron protocol. Physical and occupational therapy evaluations pending. M.D. is requested physical medicine rehabilitation consult to consider inpatient rehabilitation services.   Review of Systems  HENT: Positive for neck pain.   Gastrointestinal:       Reflux  Musculoskeletal: Positive for myalgias and joint pain.  Neurological: Positive for tingling and weakness.  All other systems reviewed and are negative.   Past Medical History  Diagnosis Date  . Arthritis   . Seasonal allergies   . GERD (gastroesophageal reflux disease)   . Guillain-Barre syndrome    Past Surgical History  Procedure Laterality Date  . Cervical fusion      C7  . Appendectomy     History reviewed. No pertinent family history. Social History:  reports that he has quit smoking. He does not have any smokeless tobacco history on file. He reports that he drinks about 0.6 ounces of alcohol per week. He reports that he does not use illicit drugs. Allergies:  Allergies  Allergen Reactions  . Penicillins Rash   Medications Prior to Admission  Medication Sig Dispense Refill  . celecoxib (CELEBREX) 200 MG capsule Take 200 mg by mouth daily.      . Cholecalciferol (VITAMIN D3) 2000 UNITS capsule Take 2,000 Units by mouth daily.      Marland Kitchen HYDROcodone-acetaminophen (VICODIN) 5-500 MG per tablet Take 0.5 tablets by mouth 2 (two)  times daily as needed for pain. For pain      . loratadine (CLARITIN) 10 MG tablet Take 10 mg by mouth daily.      . Multiple Vitamin (MULTIVITAMIN WITH MINERALS) TABS Take 1 tablet by mouth daily.      . naphazoline-pheniramine (NAPHCON-A) 0.025-0.3 % ophthalmic solution Place 1 drop into both eyes daily as needed. For irritated eyes      . omeprazole (PRILOSEC) 20 MG capsule Take 20 mg by mouth daily.      Marland Kitchen oxymetazoline (DRISTAN SPRAY) 0.05 % nasal spray Place 1 spray into the nose 2 (two) times daily as needed for congestion. For nasal congestion        Home:    Functional History:   Functional Status:  Mobility:          ADL:    Cognition: Cognition Orientation Level: Oriented X4    Blood pressure 124/88, pulse 105, temperature 98.1 F (36.7 C), temperature source Oral, resp. rate 20, height 5' 6.93" (1.7 m), weight 80.9 kg (178 lb 5.6 oz), SpO2 97.00%. Physical Exam  Vitals reviewed. Constitutional: He is oriented to person, place, and time.  Eyes: EOM are normal.  Neck:  Cervical collar in place  Cardiovascular: Normal rate and regular rhythm.   Pulmonary/Chest: Effort normal and breath sounds normal. No respiratory distress.  Abdominal: Soft. Bowel sounds are normal. He exhibits no distension.  Neurological: He is alert and oriented to person, place, and time.  Followed full commands. Some difficulties with swallow. Mild sensory loss in the RUE especially along C5-6. Strength 4/5 right deltoid and bicep. Elsewhere in  the UE's he's grossly 5/5. LE is 4/5 prox. Right ADF trace, otherwise ankles are 0/5. Atrophy of lower legs noted.   Skin:  Cervical neck incision is dressed  Psychiatric: He has a normal mood and affect. His behavior is normal. Judgment and thought content normal.    No results found for this or any previous visit (from the past 24 hour(s)). Dg Cervical Spine 1 View  04/23/2013  *RADIOLOGY REPORT*  Clinical Data: Neck pain  DG C-ARM 1-60 MIN,DG  CERVICAL SPINE - 1 VIEW  Technique:  C-arm fluoroscopic images were obtained intraoperatively and submitted for postoperative interpretation. Please see the performing provider's procedural report for the fluoroscopy time utilized.  Comparison: Plain film earlier in the day.  Findings: C-arm films document plate and screw fixation from C3-C6. Screws are identified at C3, C4-5, and possibly at C6.  The lower aspect of the construct is poorly visualized due to shoulder artifact.  Postoperative plain films recommended.  IMPRESSION: Cervical fusion as described.   Original Report Authenticated By: Davonna Belling, M.D.    Dg Cervical Spine 1 View  04/23/2013  *RADIOLOGY REPORT*  Clinical Data: Cervical disc disease.  DG CERVICAL SPINE - 1 VIEW  Comparison: None.  Findings: Single lateral portable view of the cervical spine demonstrates instruments at the C3-4 level.  5 mm spondylolisthesis of C3 on C4. There is also a needle at the anterior aspect of the C5 vertebral body.  IMPRESSION: Instrument at C3-4 and a needle at C5.   Original Report Authenticated By: Francene Boyers, M.D.    Dg C-arm 1-60 Min  04/23/2013  *RADIOLOGY REPORT*  Clinical Data: Neck pain  DG C-ARM 1-60 MIN,DG CERVICAL SPINE - 1 VIEW  Technique:  C-arm fluoroscopic images were obtained intraoperatively and submitted for postoperative interpretation. Please see the performing provider's procedural report for the fluoroscopy time utilized.  Comparison: Plain film earlier in the day.  Findings: C-arm films document plate and screw fixation from C3-C6. Screws are identified at C3, C4-5, and possibly at C6.  The lower aspect of the construct is poorly visualized due to shoulder artifact.  Postoperative plain films recommended.  IMPRESSION: Cervical fusion as described.   Original Report Authenticated By: Davonna Belling, M.D.     Assessment/Plan: Diagnosis: cervical stenosis with myelopathy and radiculopathy. Hx of GBS with chronic foot drop.  1. Does the  need for close, 24 hr/day medical supervision in concert with the patient's rehab needs make it unreasonable for this patient to be served in a less intensive setting? Potentially 2. Co-Morbidities requiring supervision/potential complications: wound care, dysphagia 3. Due to bladder management, bowel management, safety, skin/wound care, disease management, medication administration, pain management and patient education, does the patient require 24 hr/day rehab nursing? Potentially 4. Does the patient require coordinated care of a physician, rehab nurse, PT (1-2 hrs/day, 5 days/week), OT (1-2 hrs/day, 5 days/week) and SLP (1 hrs/day, 5 days/week)?  to address physical and functional deficits in the context of the above medical diagnosis(es)? Yes Addressing deficits in the following areas: balance, endurance, locomotion, strength, transferring, bowel/bladder control, bathing, dressing, feeding, grooming, toileting, swallowing and psychosocial support 5. Can the patient actively participate in an intensive therapy program of at least 3 hrs of therapy per day at least 5 days per week? Yes 6. The potential for patient to make measurable gains while on inpatient rehab is excellent 7. Anticipated functional outcomes upon discharge from inpatient rehab are mod I with PT, mod I to supervision with OT, mod  I with SLP. 8. Estimated rehab length of stay to reach the above functional goals is: 7 days? 9. Does the patient have adequate social supports to accommodate these discharge functional goals? Yes 10. Anticipated D/C setting: Home 11. Anticipated post D/C treatments: Outpt therapy 12. Overall Rehab/Functional Prognosis: excellent  RECOMMENDATIONS: This patient's condition is appropriate for continued rehabilitative care in the following setting: CIR vs HH therapies Patient has agreed to participate in recommended program. Potentially Note that insurance prior authorization may be required for  reimbursement for recommended care.  Comment: Pt with premorbid gait deficits related to his GBS. Was wearing ASO's and high top boots to stabilize his legs. We'll watch to see how he does with therapies as he uses his arms quite a bit to stabilize himself in standing and with gait. Also, he's having dysphagia---probably needs a D3 diet. SLP eval may be appropriate to assess swallow as well. Rehab RN to follow up.   Ranelle Oyster, MD, [place] [facility]     04/24/2013

## 2013-04-24 NOTE — Op Note (Signed)
NAMEMITSUO, BUDNICK               ACCOUNT NO.:  [id number]  MEDICAL RECORD NO.:  [id number]  LOCATION:  4N23C                        FACILITY:  [facility]  PHYSICIAN:  Hilda Lias, M.D.   DATE OF BIRTH:  17-Nov-1951  DATE OF PROCEDURE:  04/23/2013 DATE OF DISCHARGE:                              OPERATIVE REPORT   PREOPERATIVE DIAGNOSIS:  Cervical stenosis, 3-4, 4-5, 5-6 with gliosis. Status post fusion of C6-7.  POSTOPERATIVE DIAGNOSIS:  Cervical stenosis, 3-4, 4-5, 5-6 with gliosis. Status post fusion of C6-7.  PROCEDURE:  Anterior 3-4, 4-5, 5-6 diskectomy, decompression of spinal cord, foraminotomy, interbody fusion with Allograft, plate from Z6-X0. Microscope.  SURGEON:  Hilda Lias, MD  ASSISTANT:  Linde Gillis.  CLINICAL HISTORY:  Mr. Hollerbach is a gentleman who 25 years ago underwent fusion at the level of C6-7.  He came to my office complaining of burning sensation in both hands, associated with weakness of the deltoid and biceps after he was looking upward.  MRI showed that indeed he has a severe stenosis at the level of cervical 3-4, 4-5, 5-6 with changes in the spinal cord at the level of 4-5.  Surgery was advised.  The risks were fully explained to him including the possibility that the surgery was to stop him from getting worse and probably, he will improve.  He also has no subtle hematoma, CSF leak, need for surgery __including a________ posterior approach.  PROCEDURE:  The patient was taken to the OR and we went ahead in the right side.  He had previous surgery on the right side, so we cleaned the right side of the neck with DuraPrep.  Drapes were applied. Transverse incision was made through the skin and subcutaneous tissue straight down to the cervical area.  There was some adhesion mostly at the level of the lower part of the cervical area and lysis was accomplished.  The patient has a large osteophyte.  The first x-ray showed that indeed we were right at the  level of 3-4.  Then, we removed the anterior osteophyte at the level of 3-4, 4-5, 5-6 with working our way at the level of 3-4.  The patient had quite a bit of narrow space. We had to drill all the way posterior to the posterior ligament.  Then, using a micro hook, we opened the posterior ligament.  The patient had quite a bit of adhesion including the posterior ligament which was calcified and the dura mater.  Decompression was made and we were able to decompress the both C4 nerve root, as well as the spinal cord.  At the level of 5-6, 6-7, the area was more difficult.  The patient has found large osteophyte and we had to go ahead and remove part of the body of C5 and C6 to be able to get into the disk space.  This was done using the drill, we were able to decompress the C5 nerve roots bilaterally as well as the C6.  Then, because of the kyphosis, I was not sure if I would be able to use a plate.  We then made a decision to use allograft of 7 mm height with autograft in the middle.  The dural sac at the L2-3 disk space and we repeated the cervical x-rays showed that the patient corrected somewhat with a little bit of a lordosis.  Because of that, I was able to use a plate using 2 screws at the level of 3, 2 screws at the level of 4, and 2 screws at the level of C7.  Lateral cervical spine x-rays showed good position of the plate.  Then, the area was irrigated.  Hemovac was left in the upper cervical area and the wound was closed with Vicryl and Steri-Strips.          ______________________________ Hilda Lias, M.D.     EB/MEDQ  D:  04/23/2013  T:  04/24/2013  Job:  [id number]

## 2013-04-25 ENCOUNTER — Encounter (HOSPITAL_COMMUNITY): Payer: Self-pay | Admitting: Neurosurgery

## 2013-04-25 MED ORDER — PANTOPRAZOLE SODIUM 40 MG PO TBEC
40.0000 mg | DELAYED_RELEASE_TABLET | Freq: Every day | ORAL | Status: DC
  Administered 2013-04-26: 40 mg via ORAL
  Filled 2013-04-25: qty 1

## 2013-04-25 NOTE — Progress Notes (Signed)
I met with patient at bedside. No follow up therapy is recommended at this time. [phone]

## 2013-04-25 NOTE — Clinical Social Work Note (Signed)
Clinical Social Work   CSW reviewed chart and discussed pt with RN during progression. PT/OT are not recommending any follow. CSW is signing off as no further needs identified. Please reconsult if a need arises prior to discharge.   Dede Query, MSW, LCSW [phone]

## 2013-04-25 NOTE — Progress Notes (Signed)
Patient ID: Bradley Trujillo, male   DOB: 1951-01-21, 62 y.o.   MRN: [id number] hemovac out. Wound healing. Ambulating, stronger. Sensory better. Continue pt. Probably can be discharge in am

## 2013-04-26 MED ORDER — OXYCODONE-ACETAMINOPHEN 5-325 MG PO TABS: 1.0000 | ORAL_TABLET | ORAL | Status: DC | PRN

## 2013-04-26 MED ORDER — DIAZEPAM 5 MG PO TABS: 5.0000 mg | ORAL_TABLET | Freq: Four times a day (QID) | ORAL | Status: DC | PRN

## 2013-04-26 NOTE — Discharge Summary (Signed)
Physician Discharge Summary  Patient ID: Bradley Trujillo MRN: [id number] DOB/AGE: [birth year] 62 y.o.  Admit date: 04/23/2013 Discharge date: 04/26/2013  Admission Diagnoses:Cervical stenosis, 3-4, 4-5, 5-6 with gliosis. Status post fusion of C6-7.   Discharge Diagnoses: Cervical stenosis, 3-4, 4-5, 5-6 with gliosis. Status post fusion of C6-7.  Active Problems:   * No active hospital problems. *   Discharged Condition: good  Hospital Course: Mr. Blankenship was admitted to the hospital and taken to the operating room for cervical decompression via an anterior approach at C3-5. Post op he is voiding, moving all extremities well, and ambulating. His wound is clean dry and without signs of infection. He will be discharged today.   Consults: None  Significant Diagnostic Studies: none  Treatments: surgery: Anterior 3-4, 4-5, 5-6 diskectomy, decompression of spinal cord, foraminotomy, interbody fusion with Allograft, plate from W0-J8. Microscope.   Discharge Exam: Blood pressure 120/78, pulse 70, temperature 98.3 F (36.8 C), temperature source Oral, resp. rate 18, height 5' 6.93" (1.7 m), weight 80.9 kg (178 lb 5.6 oz), SpO2 96.00%. General appearance: alert, cooperative, appears stated age and no distress Neurologic: Mental status: Alert, oriented, thought content appropriate Cranial nerves: normal Motor: moving all extremities well Coordination: normal  Disposition: Final discharge disposition not confirmed     Medication List    ASK your doctor about these medications       celecoxib 200 MG capsule  Commonly known as:  CELEBREX  Take 200 mg by mouth daily.     DRISTAN SPRAY 0.05 % nasal spray  Generic drug:  oxymetazoline  Place 1 spray into the nose 2 (two) times daily as needed for congestion. For nasal congestion     HYDROcodone-acetaminophen 5-500 MG per tablet  Commonly known as:  VICODIN  Take 0.5 tablets by mouth 2 (two) times daily as needed for pain. For pain      loratadine 10 MG tablet  Commonly known as:  CLARITIN  Take 10 mg by mouth daily.     multivitamin with minerals Tabs  Take 1 tablet by mouth daily.     naphazoline-pheniramine 0.025-0.3 % ophthalmic solution  Commonly known as:  NAPHCON-A  Place 1 drop into both eyes daily as needed. For irritated eyes     omeprazole 20 MG capsule  Commonly known as:  PRILOSEC  Take 20 mg by mouth daily.     Vitamin D3 2000 UNITS capsule  Take 2,000 Units by mouth daily.         Signed: Johny Pitstick L 04/26/2013, 9:34 AM

## 2013-07-17 ENCOUNTER — Ambulatory Visit: Payer: 59 | Attending: Neurosurgery | Admitting: Physical Therapy

## 2013-07-17 DIAGNOSIS — R293 Abnormal posture: Secondary | ICD-10-CM | POA: Insufficient documentation

## 2013-07-17 DIAGNOSIS — R5381 Other malaise: Secondary | ICD-10-CM | POA: Insufficient documentation

## 2013-07-17 DIAGNOSIS — IMO0001 Reserved for inherently not codable concepts without codable children: Secondary | ICD-10-CM | POA: Insufficient documentation

## 2013-07-22 ENCOUNTER — Ambulatory Visit: Payer: 59 | Attending: Neurosurgery | Admitting: Physical Therapy

## 2013-07-22 DIAGNOSIS — R293 Abnormal posture: Secondary | ICD-10-CM | POA: Insufficient documentation

## 2013-07-22 DIAGNOSIS — IMO0001 Reserved for inherently not codable concepts without codable children: Secondary | ICD-10-CM | POA: Insufficient documentation

## 2013-07-22 DIAGNOSIS — R5381 Other malaise: Secondary | ICD-10-CM | POA: Insufficient documentation

## 2013-07-24 ENCOUNTER — Ambulatory Visit: Payer: 59 | Admitting: Physical Therapy

## 2013-07-29 ENCOUNTER — Ambulatory Visit: Payer: 59 | Admitting: Physical Therapy

## 2013-07-31 ENCOUNTER — Ambulatory Visit: Payer: 59 | Admitting: Physical Therapy

## 2013-08-05 ENCOUNTER — Ambulatory Visit: Payer: 59 | Admitting: *Deleted

## 2013-08-08 ENCOUNTER — Ambulatory Visit: Payer: 59 | Admitting: Physical Therapy

## 2013-08-12 ENCOUNTER — Ambulatory Visit: Payer: 59

## 2013-08-14 ENCOUNTER — Ambulatory Visit: Payer: 59

## 2013-08-20 ENCOUNTER — Ambulatory Visit: Payer: 59 | Attending: Neurosurgery | Admitting: Physical Therapy

## 2013-08-20 DIAGNOSIS — R293 Abnormal posture: Secondary | ICD-10-CM | POA: Insufficient documentation

## 2013-08-20 DIAGNOSIS — IMO0001 Reserved for inherently not codable concepts without codable children: Secondary | ICD-10-CM | POA: Insufficient documentation

## 2013-08-20 DIAGNOSIS — R5381 Other malaise: Secondary | ICD-10-CM | POA: Insufficient documentation

## 2013-08-26 ENCOUNTER — Ambulatory Visit: Payer: 59 | Admitting: Physical Therapy

## 2013-08-28 ENCOUNTER — Ambulatory Visit: Payer: 59 | Admitting: Physical Therapy

## 2013-09-02 ENCOUNTER — Ambulatory Visit: Payer: 59 | Admitting: *Deleted

## 2013-09-04 ENCOUNTER — Ambulatory Visit: Payer: 59 | Admitting: *Deleted

## 2013-09-09 ENCOUNTER — Ambulatory Visit: Payer: 59 | Admitting: *Deleted

## 2013-09-11 ENCOUNTER — Ambulatory Visit: Payer: 59 | Admitting: *Deleted

## 2013-09-16 ENCOUNTER — Encounter: Payer: 59 | Admitting: *Deleted

## 2013-09-18 ENCOUNTER — Ambulatory Visit: Payer: 59 | Attending: Neurosurgery | Admitting: *Deleted

## 2013-09-18 DIAGNOSIS — R293 Abnormal posture: Secondary | ICD-10-CM | POA: Insufficient documentation

## 2013-09-18 DIAGNOSIS — R5381 Other malaise: Secondary | ICD-10-CM | POA: Insufficient documentation

## 2013-09-18 DIAGNOSIS — IMO0001 Reserved for inherently not codable concepts without codable children: Secondary | ICD-10-CM | POA: Insufficient documentation

## 2013-09-23 ENCOUNTER — Ambulatory Visit: Payer: 59 | Admitting: *Deleted

## 2013-09-25 ENCOUNTER — Ambulatory Visit: Payer: 59 | Admitting: *Deleted

## 2013-09-30 ENCOUNTER — Ambulatory Visit: Payer: 59

## 2013-10-02 ENCOUNTER — Ambulatory Visit: Payer: 59 | Admitting: *Deleted

## 2013-10-09 ENCOUNTER — Ambulatory Visit: Payer: 59 | Admitting: *Deleted

## 2013-10-14 ENCOUNTER — Ambulatory Visit: Payer: 59 | Admitting: *Deleted

## 2013-10-16 ENCOUNTER — Ambulatory Visit: Payer: 59 | Admitting: *Deleted

## 2013-10-21 ENCOUNTER — Ambulatory Visit: Payer: 59 | Attending: Neurosurgery | Admitting: *Deleted

## 2013-10-21 DIAGNOSIS — IMO0001 Reserved for inherently not codable concepts without codable children: Secondary | ICD-10-CM | POA: Insufficient documentation

## 2013-10-21 DIAGNOSIS — R5381 Other malaise: Secondary | ICD-10-CM | POA: Insufficient documentation

## 2013-10-21 DIAGNOSIS — R293 Abnormal posture: Secondary | ICD-10-CM | POA: Insufficient documentation

## 2013-10-23 ENCOUNTER — Encounter: Payer: 59 | Admitting: *Deleted

## 2013-11-04 ENCOUNTER — Ambulatory Visit: Payer: 59 | Admitting: *Deleted

## 2013-11-06 ENCOUNTER — Ambulatory Visit: Payer: 59 | Admitting: *Deleted

## 2013-11-18 ENCOUNTER — Ambulatory Visit: Payer: 59 | Attending: Neurosurgery | Admitting: *Deleted

## 2013-11-18 DIAGNOSIS — IMO0001 Reserved for inherently not codable concepts without codable children: Secondary | ICD-10-CM | POA: Insufficient documentation

## 2013-11-18 DIAGNOSIS — R5381 Other malaise: Secondary | ICD-10-CM | POA: Insufficient documentation

## 2013-11-18 DIAGNOSIS — R293 Abnormal posture: Secondary | ICD-10-CM | POA: Insufficient documentation

## 2013-11-20 ENCOUNTER — Ambulatory Visit: Payer: 59 | Admitting: *Deleted

## 2013-11-25 ENCOUNTER — Ambulatory Visit: Payer: 59 | Admitting: Physical Therapy

## 2013-12-02 ENCOUNTER — Ambulatory Visit: Payer: 59 | Admitting: *Deleted

## 2013-12-04 ENCOUNTER — Encounter: Payer: 59 | Admitting: *Deleted

## 2013-12-09 ENCOUNTER — Ambulatory Visit: Payer: 59 | Admitting: *Deleted

## 2013-12-16 ENCOUNTER — Ambulatory Visit: Payer: 59 | Admitting: *Deleted

## 2014-06-04 IMAGING — CR DG CERVICAL SPINE 1V
1 series · 1 of 1 positions shown · non-contrast
Comparison: None.

CLINICAL DATA: Cervical disc disease.

DG CERVICAL SPINE - 1 VIEW

[view not recorded]
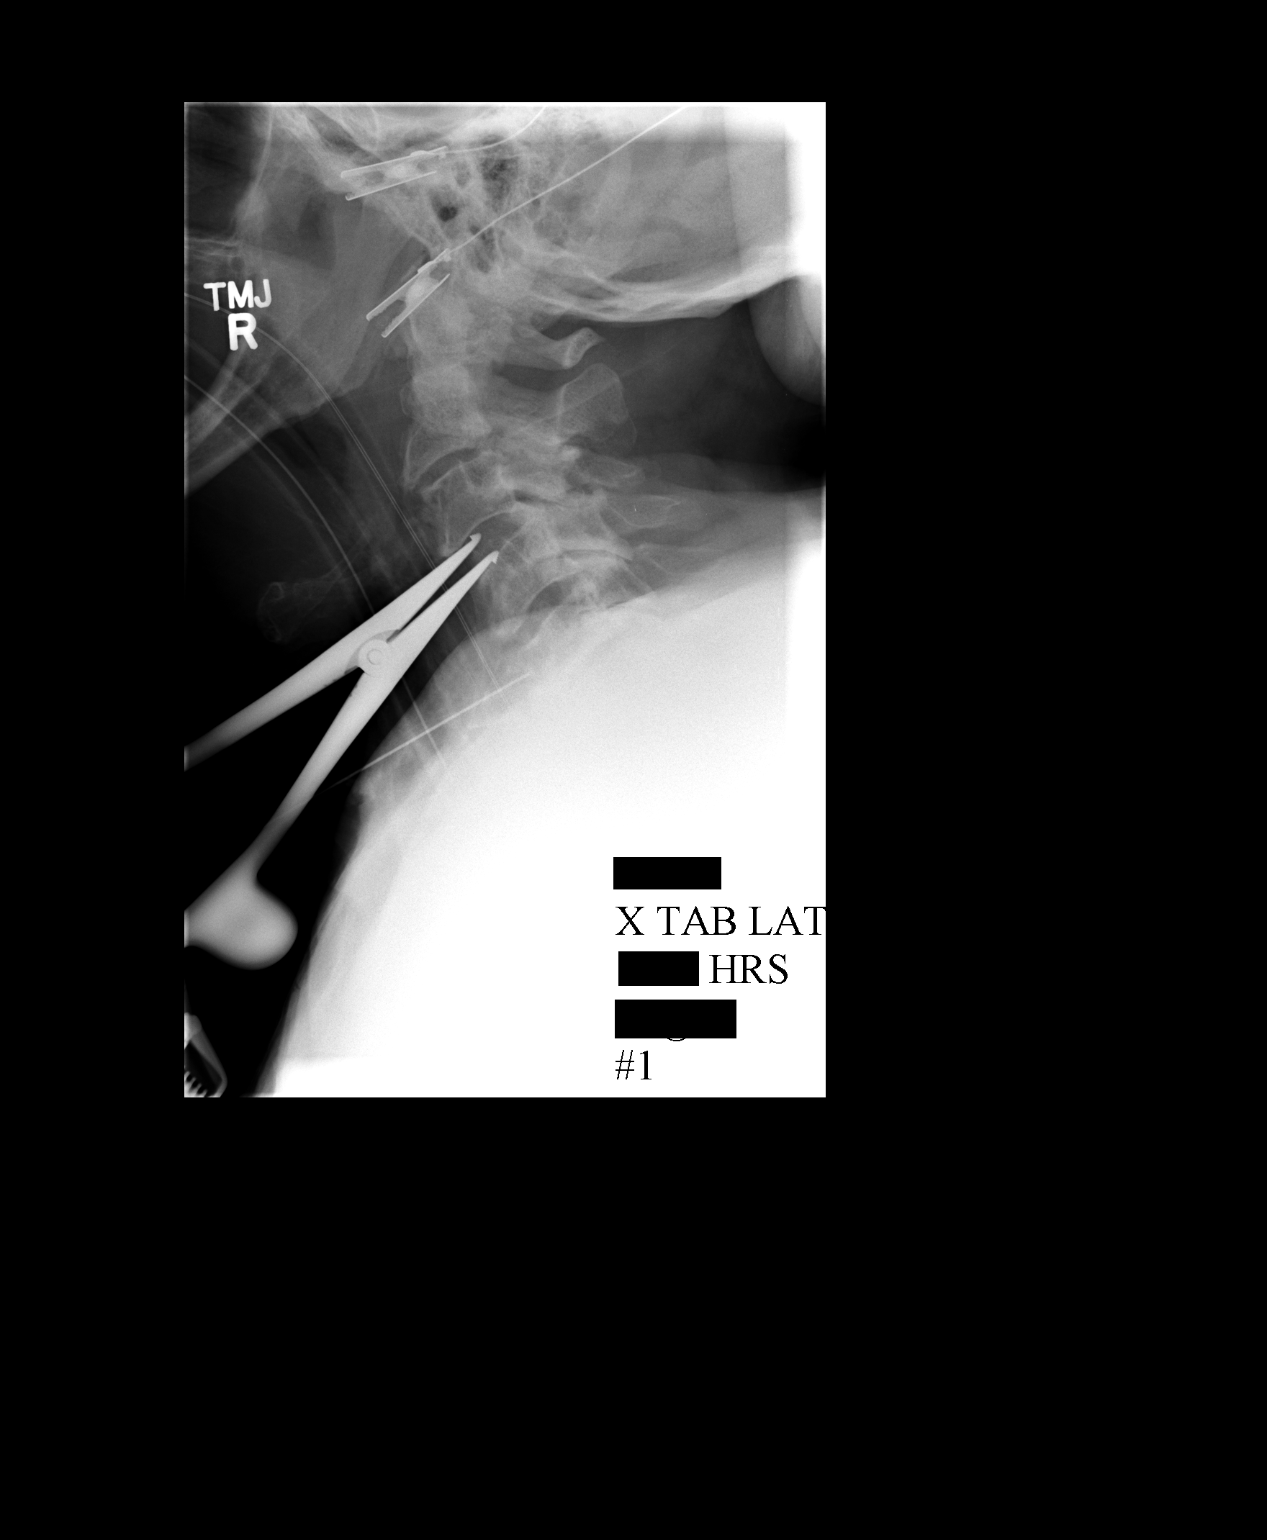

[1 of 1 positions shown; findings below may reference images not displayed]

FINDINGS: Single lateral portable view of the cervical spine
demonstrates instruments at the C3-4 level.  5 mm spondylolisthesis
of C3 on C4. There is also a needle at the anterior aspect of the
C5 vertebral body.
IMPRESSION: Instrument at C3-4 and a needle at C5.

## 2014-06-04 IMAGING — RF DG C-ARM 61-120 MIN
1 series · 2 of 2 positions shown · non-contrast
Comparison: Plain film earlier in the day.

CLINICAL DATA: Neck pain

DG C-ARM 1-60 MIN,DG CERVICAL SPINE - 1 VIEW
TECHNIQUE: C-arm fluoroscopic images were obtained
intraoperatively and submitted for postoperative interpretation.
Please see the performing provider's procedural report for the
fluoroscopy time utilized.

[Series 1: run · 2 of 2 slices shown]
[im 1/2]
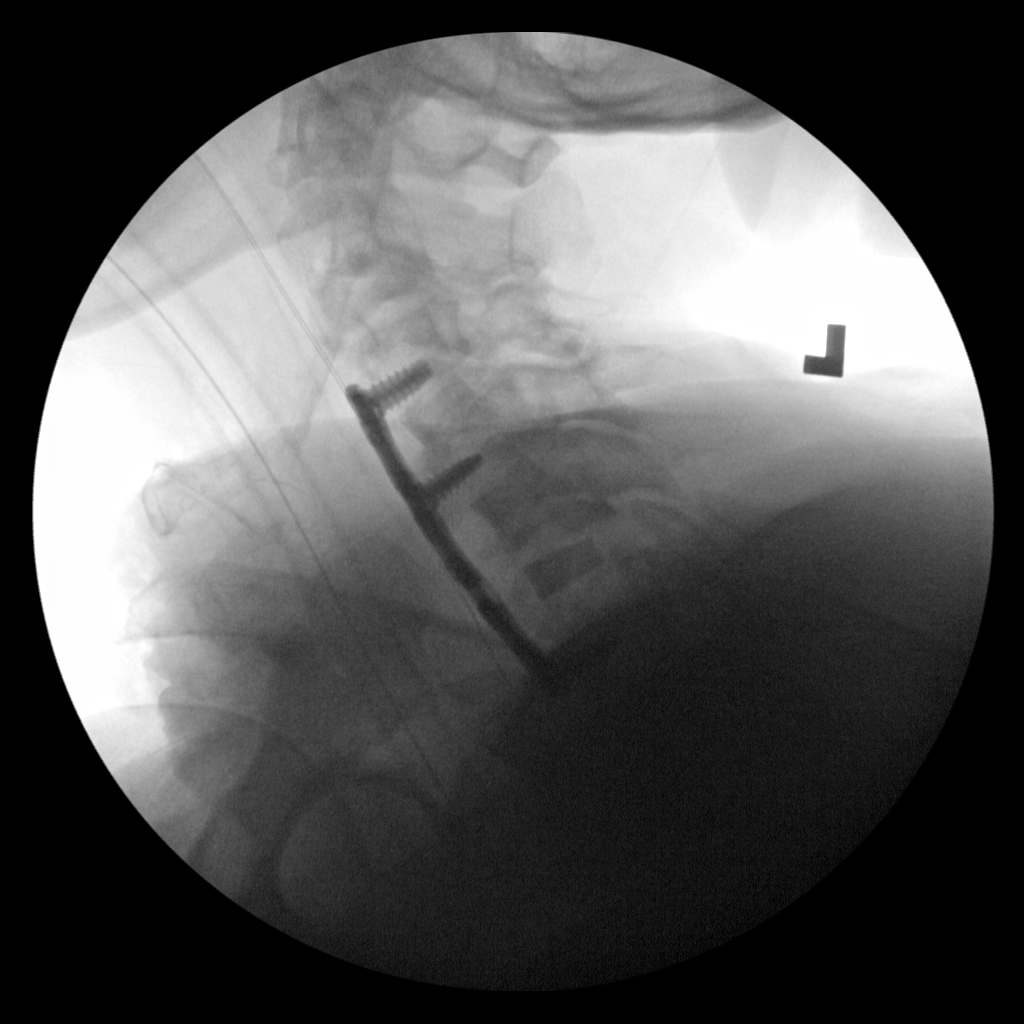
[im 2/2]
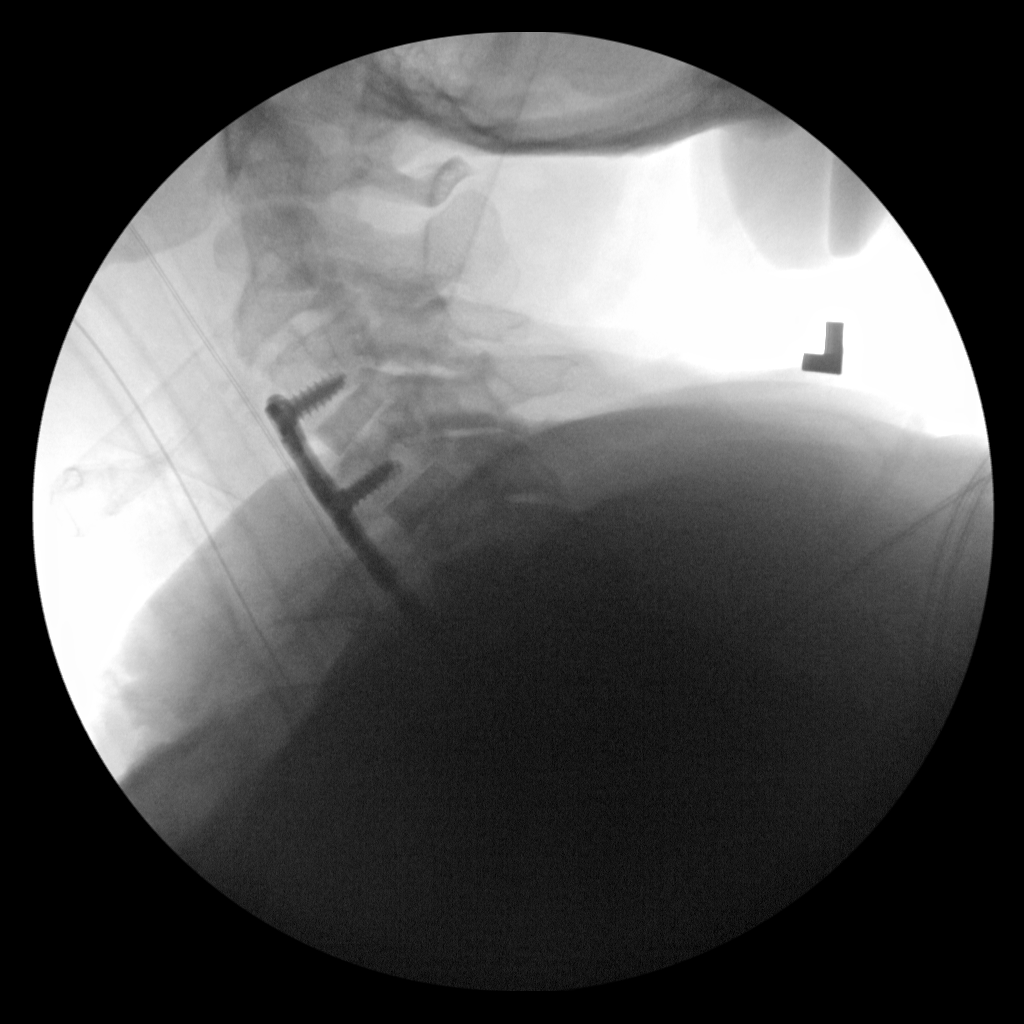

[2 of 2 positions shown; findings below may reference images not displayed]

FINDINGS: C-arm films document plate and screw fixation from C3-C6.
Screws are identified at C3, C4-5, and possibly at C6.  The lower
aspect of the construct is poorly visualized due to shoulder
artifact.  Postoperative plain films recommended.
IMPRESSION: Cervical fusion as described.

## 2014-11-03 ENCOUNTER — Ambulatory Visit: Payer: 59 | Attending: Neurosurgery | Admitting: Physical Therapy

## 2014-11-03 DIAGNOSIS — M545 Low back pain: Secondary | ICD-10-CM | POA: Diagnosis not present

## 2014-11-03 DIAGNOSIS — Z981 Arthrodesis status: Secondary | ICD-10-CM | POA: Diagnosis not present

## 2014-11-03 DIAGNOSIS — M4316 Spondylolisthesis, lumbar region: Secondary | ICD-10-CM | POA: Diagnosis not present

## 2014-11-03 DIAGNOSIS — Z5189 Encounter for other specified aftercare: Secondary | ICD-10-CM | POA: Insufficient documentation

## 2014-11-05 ENCOUNTER — Ambulatory Visit: Payer: 59 | Admitting: *Deleted

## 2014-11-05 DIAGNOSIS — Z5189 Encounter for other specified aftercare: Secondary | ICD-10-CM | POA: Diagnosis not present

## 2014-11-17 ENCOUNTER — Ambulatory Visit: Payer: 59 | Attending: Neurosurgery | Admitting: *Deleted

## 2014-11-17 DIAGNOSIS — Z981 Arthrodesis status: Secondary | ICD-10-CM | POA: Insufficient documentation

## 2014-11-17 DIAGNOSIS — M4316 Spondylolisthesis, lumbar region: Secondary | ICD-10-CM | POA: Diagnosis not present

## 2014-11-17 DIAGNOSIS — M545 Low back pain: Secondary | ICD-10-CM | POA: Diagnosis not present

## 2014-11-17 DIAGNOSIS — Z5189 Encounter for other specified aftercare: Secondary | ICD-10-CM | POA: Insufficient documentation

## 2014-11-19 ENCOUNTER — Encounter: Payer: 59 | Admitting: *Deleted

## 2014-11-26 ENCOUNTER — Encounter: Payer: 59 | Admitting: *Deleted

## 2014-12-03 ENCOUNTER — Ambulatory Visit: Payer: 59 | Admitting: *Deleted

## 2020-07-19 ENCOUNTER — Other Ambulatory Visit: Payer: Self-pay | Admitting: Neurosurgery

## 2020-07-19 DIAGNOSIS — M48062 Spinal stenosis, lumbar region with neurogenic claudication: Secondary | ICD-10-CM

## 2020-07-20 ENCOUNTER — Telehealth: Payer: Self-pay | Admitting: Nurse Practitioner

## 2020-07-20 NOTE — Telephone Encounter (Signed)
Phone call to patient to verify medication list and allergies for myelogram procedure. Pt aware he will not need to hold any medications for this procedure. Pre and post procedure instructions reviewed with pt. Pt verbalized understanding.  

## 2020-07-26 ENCOUNTER — Ambulatory Visit
Admission: RE | Admit: 2020-07-26 | Discharge: 2020-07-26 | Disposition: A | Discharge status: Home / Self Care | Payer: Medicare Other | Source: Ambulatory Visit | Attending: Neurosurgery | Admitting: Neurosurgery

## 2020-07-26 ENCOUNTER — Other Ambulatory Visit: Payer: Self-pay

## 2020-07-26 DIAGNOSIS — M48062 Spinal stenosis, lumbar region with neurogenic claudication: Secondary | ICD-10-CM

## 2020-07-26 MED ORDER — HYDROXYZINE HCL 50 MG/ML IM SOLN
25.0000 mg | Freq: Once | INTRAMUSCULAR | Status: AC
  Administered 2020-07-26: 25 mg via INTRAMUSCULAR

## 2020-07-26 MED ORDER — DIAZEPAM 5 MG PO TABS
10.0000 mg | ORAL_TABLET | Freq: Once | ORAL | Status: AC
  Administered 2020-07-26: 10 mg via ORAL

## 2020-07-26 MED ORDER — IOPAMIDOL (ISOVUE-M 200) INJECTION 41%: 15.0000 mL | Freq: Once | INTRAMUSCULAR | Status: DC

## 2020-07-26 MED ORDER — MEPERIDINE HCL 100 MG/ML IJ SOLN
75.0000 mg | Freq: Once | INTRAMUSCULAR | Status: AC
  Administered 2020-07-26: 75 mg via INTRAMUSCULAR

## 2020-07-26 NOTE — Discharge Instructions (Signed)

## 2020-08-06 ENCOUNTER — Other Ambulatory Visit: Payer: Self-pay | Admitting: Neurosurgery

## 2020-08-20 NOTE — Pre-Procedure Instructions (Signed)
[id number] [place], [place] [address] [place] [address] Phone: [phone] Fax: [phone]      Your procedure is scheduled on Thursday September 9th.  Report to [facility] Main Entrance "A" at 5:30 A.M., and check in at the Admitting office.  Call this number if you have problems the morning of surgery:  [phone]  Call [phone] if you have any questions prior to your surgery date Monday-Friday 8am-4pm    Remember:  Do not eat or drink anything after midnight the night before your surgery   Take these medicines the morning of surgery with A SIP OF WATER   omeprazole (PRILOSEC) 20 MG capsule     IF NEEDED:  Glycerin-Hypromellose-PEG 400 (VISINE DRY EYE) 0.2-0.2-1 % SOLN  HYDROcodone-acetaminophen (NORCO/VICODIN) 5-325 MG tablet    Oxymetazoline HCl (SINEX LONG-ACTING NA)As of today, STOP taking any Aspirin   (unless otherwise instructed by your surgeon) Aleve, Naproxen, Ibuprofen, Motrin, Advil, Goody's, BC's, all herbal medications, fish oil, and all vitamins.                      Do not wear jewelry            Do not wear lotions, powders, colognes, or deodorant.            Do not shave 48 hours prior to surgery.  Men may shave face and neck.            Do not bring valuables to the hospital.            [facility] is not responsible for any belongings or valuables.  Do NOT Smoke (Tobacco/Vaping) or drink Alcohol 24 hours prior to your procedure If you use a CPAP at night, you may bring all equipment for your overnight stay.   Contacts, glasses, dentures or bridgework may not be worn into surgery.      For patients admitted to the hospital, discharge time will be determined by your treatment team.   Patients discharged the day of surgery will not be allowed to drive home, and someone needs to stay with them for 24 hours.    Special instructions:   [place]- Preparing For  Surgery  Before surgery, you can play an important role. Because skin is not sterile, your skin needs to be as free of germs as possible. You can reduce the number of germs on your skin by washing with CHG (chlorahexidine gluconate) Soap before surgery.  CHG is an antiseptic cleaner which kills germs and bonds with the skin to continue killing germs even after washing.    Oral Hygiene is also important to reduce your risk of infection.  Remember - BRUSH YOUR TEETH THE MORNING OF SURGERY WITH YOUR REGULAR TOOTHPASTE  Please do not use if you have an allergy to CHG or antibacterial soaps. If your skin becomes reddened/irritated stop using the CHG.  Do not shave (including legs and underarms) for at least 48 hours prior to first CHG shower. It is OK to shave your face.  Please follow these instructions carefully.   1. Shower the NIGHT BEFORE SURGERY and the MORNING OF SURGERY with CHG Soap.   2. If you chose to wash your hair, wash your hair first as usual with your normal shampoo.  3. After you shampoo, rinse your hair and body thoroughly to remove the shampoo.  4. Use CHG as you  would any other liquid soap. You can apply CHG directly to the skin and wash gently with a scrungie or a clean washcloth.   5. Apply the CHG Soap to your body ONLY FROM THE NECK DOWN.  Do not use on open wounds or open sores. Avoid contact with your eyes, ears, mouth and genitals (private parts). Wash Face and genitals (private parts)  with your normal soap.   6. Wash thoroughly, paying special attention to the area where your surgery will be performed.  7. Thoroughly rinse your body with warm water from the neck down.  8. DO NOT shower/wash with your normal soap after using and rinsing off the CHG Soap.  9. Pat yourself dry with a CLEAN TOWEL.  10. Wear CLEAN PAJAMAS to bed the night before surgery  11. Place CLEAN SHEETS on your bed the night of your first shower and DO NOT SLEEP WITH PETS.   Day of  Surgery: Wear Clean/Comfortable clothing the morning of surgery Do not apply any deodorants/lotions.   Remember to brush your teeth WITH YOUR REGULAR TOOTHPASTE.   Please read over the following fact sheets that you were given.

## 2020-08-24 ENCOUNTER — Encounter (HOSPITAL_COMMUNITY)
Admission: RE | Admit: 2020-08-24 | Discharge: 2020-08-24 | Disposition: A | Discharge status: Home / Self Care | Payer: 59 | Source: Ambulatory Visit | Attending: Neurosurgery | Admitting: Neurosurgery

## 2020-08-24 ENCOUNTER — Other Ambulatory Visit: Payer: Self-pay

## 2020-08-24 ENCOUNTER — Other Ambulatory Visit (HOSPITAL_COMMUNITY)
Admission: RE | Admit: 2020-08-24 | Discharge: 2020-08-24 | Disposition: A | Discharge status: Home / Self Care | Payer: 59 | Source: Ambulatory Visit | Attending: Neurosurgery | Admitting: Neurosurgery

## 2020-08-24 ENCOUNTER — Other Ambulatory Visit: Payer: Self-pay | Admitting: Neurosurgery

## 2020-08-24 ENCOUNTER — Encounter (HOSPITAL_COMMUNITY): Payer: Self-pay

## 2020-08-24 DIAGNOSIS — Z20822 Contact with and (suspected) exposure to covid-19: Secondary | ICD-10-CM | POA: Insufficient documentation

## 2020-08-24 DIAGNOSIS — Z01818 Encounter for other preprocedural examination: Secondary | ICD-10-CM | POA: Diagnosis present

## 2020-08-24 DIAGNOSIS — Z01812 Encounter for preprocedural laboratory examination: Secondary | ICD-10-CM | POA: Insufficient documentation

## 2020-08-24 HISTORY — DX: Essential (primary) hypertension: I10

## 2020-08-24 HISTORY — DX: Personal history of other diseases of the digestive system: Z87.19

## 2020-08-24 LAB — CBC
HCT: 42.3 % (ref 39.0–52.0)
Hemoglobin: 15.1 g/dL (ref 13.0–17.0)
MCH: 29.8 pg (ref 26.0–34.0)
MCHC: 35.7 g/dL (ref 30.0–36.0)
MCV: 83.6 fL (ref 80.0–100.0)
Platelets: 328 10*3/uL (ref 150–400)
RBC: 5.06 MIL/uL (ref 4.22–5.81)
RDW: 12.7 % (ref 11.5–15.5)
WBC: 9.8 10*3/uL (ref 4.0–10.5)
nRBC: 0 % (ref 0.0–0.2)

## 2020-08-24 LAB — COMPREHENSIVE METABOLIC PANEL
ALT: 36 U/L (ref 0–44)
AST: 55 U/L — ABNORMAL HIGH (ref 15–41)
Albumin: 4.5 g/dL (ref 3.5–5.0)
Alkaline Phosphatase: 52 U/L (ref 38–126)
Anion gap: 12 (ref 5–15)
BUN: 8 mg/dL (ref 8–23)
CO2: 26 mmol/L (ref 22–32)
Calcium: 9.9 mg/dL (ref 8.9–10.3)
Chloride: 86 mmol/L — ABNORMAL LOW (ref 98–111)
Creatinine, Ser: 1.15 mg/dL (ref 0.61–1.24)
GFR calc Af Amer: >60 mL/min (ref >60)
GFR calc non Af Amer: >60 mL/min (ref >60)
Glucose, Bld: 122 mg/dL — ABNORMAL HIGH (ref 70–99)
Potassium: 2.9 mmol/L — ABNORMAL LOW (ref 3.5–5.1)
Sodium: 124 mmol/L — ABNORMAL LOW (ref 135–145)
Total Bilirubin: 1.3 mg/dL — ABNORMAL HIGH (ref 0.3–1.2)
Total Protein: 7.4 g/dL (ref 6.5–8.1)

## 2020-08-24 LAB — TYPE AND SCREEN
ABO/RH(D): O POS
Antibody Screen: NEGATIVE

## 2020-08-24 LAB — SURGICAL PCR SCREEN
MRSA, PCR: NEGATIVE
Staphylococcus aureus: NEGATIVE

## 2020-08-24 NOTE — Pre-Procedure Instructions (Signed)
Your procedure is scheduled on Thursday September 9, from 07:30 AM- 11:19 AM.  Report to [facility] Main Entrance "A" at 5:30 A.M., and check in at the Admitting office.  Call this number if you have problems the morning of surgery:  [phone]  Call [phone] if you have any questions prior to your surgery date Monday-Friday 8am-4pm.    Remember:  Do not eat or drink anything after midnight the night before your surgery.   Take these medicines the morning of surgery with A SIP OF WATER:  omeprazole (PRILOSEC)     IF NEEDED:  Glycerin-Hypromellose-PEG 400 (VISINE DRY EYE) eye drops  HYDROcodone-acetaminophen (NORCO/VICODIN)    Oxymetazoline HCl (SINEX LONG-ACTING NA)  As of today, STOP taking any Aspirin (unless otherwise instructed by your surgeon) Aleve, Naproxen, Ibuprofen, Motrin, Advil, Goody's, BC's, all herbal medications, fish oil, and all vitamins.      The Morning of Surgery:                Do not wear jewelry.            Do not wear lotions, powders, colognes, or deodorant.            Men may shave face and neck.            Do not bring valuables to the hospital.            [facility] is not responsible for any belongings or valuables.  Do NOT Smoke (Tobacco/Vaping) or drink Alcohol 24 hours prior to your procedure.  If you use a CPAP at night, you may bring all equipment for your overnight stay.   Contacts, glasses, dentures or bridgework may not be worn into surgery.      For patients admitted to the hospital, discharge time will be determined by your treatment team.   Patients discharged the day of surgery will not be allowed to drive home, and someone needs to stay with them for 24 hours.    Special instructions:   [place]- Preparing For Surgery  Before surgery, you can play an important role. Because skin is not sterile, your skin needs to be as free of germs as possible. You can reduce the number of germs on your skin by washing with CHG  (chlorahexidine gluconate) Soap before surgery.  CHG is an antiseptic cleaner which kills germs and bonds with the skin to continue killing germs even after washing.    Oral Hygiene is also important to reduce your risk of infection.  Remember - BRUSH YOUR TEETH THE MORNING OF SURGERY WITH YOUR REGULAR TOOTHPASTE  Please do not use if you have an allergy to CHG or antibacterial soaps. If your skin becomes reddened/irritated stop using the CHG.  Do not shave (including legs and underarms) for at least 48 hours prior to first CHG shower. It is OK to shave your face.  Please follow these instructions carefully.   1. Shower the NIGHT BEFORE SURGERY and the MORNING OF SURGERY with CHG Soap.   2. If you chose to wash your hair, wash your hair first as usual with your normal shampoo.  3. After you shampoo, rinse your hair and body thoroughly to remove the shampoo.  4. Use CHG as you would any other liquid soap. You can apply CHG directly to the skin and wash gently with a scrungie or a clean washcloth.   5. Apply the CHG Soap to your body ONLY FROM THE NECK DOWN.  Do  not use on open wounds or open sores. Avoid contact with your eyes, ears, mouth and genitals (private parts). Wash Face and genitals (private parts)  with your normal soap.   6. Wash thoroughly, paying special attention to the area where your surgery will be performed.  7. Thoroughly rinse your body with warm water from the neck down.  8. DO NOT shower/wash with your normal soap after using and rinsing off the CHG Soap.  9. Pat yourself dry with a CLEAN TOWEL.  10. Wear CLEAN PAJAMAS to bed the night before surgery  11. Place CLEAN SHEETS on your bed the night of your first shower and DO NOT SLEEP WITH PETS.   Day of Surgery: Shower Wear Clean/Comfortable clothing the morning of surgery. Do not apply any deodorants/lotions.   Remember to brush your teeth WITH YOUR REGULAR TOOTHPASTE.   Please read over the following fact  sheets that you were given.

## 2020-08-24 NOTE — Progress Notes (Signed)
°   08/24/20 1311  OBSTRUCTIVE SLEEP APNEA  Have you ever been diagnosed with sleep apnea through a sleep study? No  Do you snore loudly (loud enough to be heard through closed doors)?  1  Do you often feel tired, fatigued, or sleepy during the daytime (such as falling asleep during driving or talking to someone)? 0  Has anyone observed you stop breathing during your sleep? 1  Do you have, or are you being treated for high blood pressure? 1  BMI more than 35 kg/m2? 0  Age > 50 (1-yes) 1  Neck circumference greater than:Male 16 inches or larger, Male 17inches or larger? 0  Male Gender (Yes=1) 1  Obstructive Sleep Apnea Score 5

## 2020-08-24 NOTE — Progress Notes (Signed)
Called and left VM for [facility], scheduler with Dr. Arnoldo Morale to report abnormal CMP.

## 2020-08-24 NOTE — Progress Notes (Addendum)
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 ECHO - Denies Cardiac Cath - Denies  Sleep Study - Denies Patient denies being diabetic.  Blood Thinner Instructions: N/A Aspirin Instructions: N/A  ERAS Protcol - N/A PRE-SURGERY Ensure or G2- N/A  COVID TEST- 08/24/20   Anesthesia review: Yes, Abnormal CMP.  Patient denies shortness of breath, fever, cough and chest pain at PAT appointment   All instructions explained to the patient, with a verbal understanding of the material. Patient agrees to go over the instructions while at home for a better understanding. Patient also instructed to self quarantine after being tested for COVID-19. The opportunity to ask questions was provided.

## 2020-08-25 LAB — SARS CORONAVIRUS 2 (TAT 6-24 HRS): SARS Coronavirus 2: NEGATIVE

## 2020-09-07 ENCOUNTER — Other Ambulatory Visit: Payer: Self-pay | Admitting: Neurosurgery

## 2020-09-13 NOTE — Pre-Procedure Instructions (Signed)
Your procedure is scheduled on Thursday, September 30th  Report to [facility] Main Entrance "A" at 11:00 A.M., and check in at the Admitting office.  Call this number if you have problems the morning of surgery:  [phone]  Call [phone] if you have any questions prior to your surgery date Monday-Friday 8am-4pm.   Remember:  Do not eat or drink anything after midnight the night before your surgery.   Take these medicines the morning of surgery with A SIP OF WATER:  omeprazole (PRILOSEC)     IF NEEDED:  Glycerin-Hypromellose-PEG 400 (VISINE DRY EYE) eye drops  HYDROcodone-acetaminophen (NORCO/VICODIN)    Oxymetazoline HCl (SINEX LONG-ACTING NA)  As of today, STOP taking Celebrex, any Aspirin (unless otherwise instructed by your surgeon) Aleve, Naproxen, Ibuprofen, Motrin, Advil, Goody's, BC's, all herbal medications, fish oil, and all vitamins.      The Morning of Surgery:                Do not wear jewelry.            Do not wear lotions, powders, colognes, or deodorant.            Men may shave face and neck.            Do not bring valuables to the hospital.            [facility] is not responsible for any belongings or valuables.  Do NOT Smoke (Tobacco/Vaping) or drink Alcohol 24 hours prior to your procedure.  If you use a CPAP at night, you may bring all equipment for your overnight stay.   Contacts, glasses, dentures or bridgework may not be worn into surgery.      For patients admitted to the hospital, discharge time will be determined by your treatment team.   Patients discharged the day of surgery will not be allowed to drive home, and someone needs to stay with them for 24 hours.  Special instructions:   [place]- Preparing For Surgery  Before surgery, you can play an important role. Because skin is not sterile, your skin needs to be as free of germs as possible. You can reduce the number of germs on your skin by washing with CHG (chlorahexidine  gluconate) Soap before surgery.  CHG is an antiseptic cleaner which kills germs and bonds with the skin to continue killing germs even after washing.    Oral Hygiene is also important to reduce your risk of infection.  Remember - BRUSH YOUR TEETH THE MORNING OF SURGERY WITH YOUR REGULAR TOOTHPASTE  Please do not use if you have an allergy to CHG or antibacterial soaps. If your skin becomes reddened/irritated stop using the CHG.  Do not shave (including legs and underarms) for at least 48 hours prior to first CHG shower. It is OK to shave your face.  Please follow these instructions carefully.   1. Shower the NIGHT BEFORE SURGERY and the MORNING OF SURGERY with CHG Soap.   2. If you chose to wash your hair, wash your hair first as usual with your normal shampoo.  3. After you shampoo, rinse your hair and body thoroughly to remove the shampoo.  4. Use CHG as you would any other liquid soap. You can apply CHG directly to the skin and wash gently with a scrungie or a clean washcloth.   5. Apply the CHG Soap to your body ONLY FROM THE NECK DOWN.  Do not use on open wounds or open sores.  Avoid contact with your eyes, ears, mouth and genitals (private parts). Wash Face and genitals (private parts)  with your normal soap.   6. Wash thoroughly, paying special attention to the area where your surgery will be performed.  7. Thoroughly rinse your body with warm water from the neck down.  8. DO NOT shower/wash with your normal soap after using and rinsing off the CHG Soap.  9. Pat yourself dry with a CLEAN TOWEL.  10. Wear CLEAN PAJAMAS to bed the night before surgery  11. Place CLEAN SHEETS on your bed the night of your first shower and DO NOT SLEEP WITH PETS.  Day of Surgery: Shower Wear Clean/Comfortable clothing the morning of surgery. Do not apply any deodorants/lotions.   Remember to brush your teeth WITH YOUR REGULAR TOOTHPASTE.   Please read over the following fact sheets that you  were given.

## 2020-09-14 ENCOUNTER — Encounter (HOSPITAL_COMMUNITY)
Admission: RE | Admit: 2020-09-14 | Discharge: 2020-09-14 | Disposition: A | Discharge status: Home / Self Care | Payer: 59 | Source: Ambulatory Visit | Attending: Neurosurgery | Admitting: Neurosurgery

## 2020-09-14 ENCOUNTER — Other Ambulatory Visit: Payer: Self-pay

## 2020-09-14 ENCOUNTER — Other Ambulatory Visit (HOSPITAL_COMMUNITY)
Admission: RE | Admit: 2020-09-14 | Discharge: 2020-09-14 | Disposition: A | Discharge status: Home / Self Care | Payer: 59 | Source: Ambulatory Visit | Attending: Neurosurgery | Admitting: Neurosurgery

## 2020-09-14 DIAGNOSIS — Z01812 Encounter for preprocedural laboratory examination: Secondary | ICD-10-CM | POA: Insufficient documentation

## 2020-09-14 DIAGNOSIS — Z20822 Contact with and (suspected) exposure to covid-19: Secondary | ICD-10-CM | POA: Insufficient documentation

## 2020-09-14 LAB — BASIC METABOLIC PANEL
Anion gap: 7 (ref 5–15)
BUN: 7 mg/dL — ABNORMAL LOW (ref 8–23)
CO2: 24 mmol/L (ref 22–32)
Calcium: 9.3 mg/dL (ref 8.9–10.3)
Chloride: 102 mmol/L (ref 98–111)
Creatinine, Ser: 1.02 mg/dL (ref 0.61–1.24)
GFR calc Af Amer: >60 mL/min (ref >60)
GFR calc non Af Amer: >60 mL/min (ref >60)
Glucose, Bld: 108 mg/dL — ABNORMAL HIGH (ref 70–99)
Potassium: 4.1 mmol/L (ref 3.5–5.1)
Sodium: 133 mmol/L — ABNORMAL LOW (ref 135–145)

## 2020-09-14 LAB — CBC
HCT: 39.6 % (ref 39.0–52.0)
Hemoglobin: 12.9 g/dL — ABNORMAL LOW (ref 13.0–17.0)
MCH: 29.1 pg (ref 26.0–34.0)
MCHC: 32.6 g/dL (ref 30.0–36.0)
MCV: 89.4 fL (ref 80.0–100.0)
Platelets: 243 10*3/uL (ref 150–400)
RBC: 4.43 MIL/uL (ref 4.22–5.81)
RDW: 13.3 % (ref 11.5–15.5)
WBC: 7.3 10*3/uL (ref 4.0–10.5)
nRBC: 0 % (ref 0.0–0.2)

## 2020-09-14 LAB — TYPE AND SCREEN
ABO/RH(D): O POS
Antibody Screen: NEGATIVE

## 2020-09-14 LAB — SARS CORONAVIRUS 2 (TAT 6-24 HRS): SARS Coronavirus 2: NEGATIVE

## 2020-09-14 NOTE — Progress Notes (Signed)
Lab appointment only.  Instructions given with new date of surgery and arrival time provided to patient.   COVID test: Scheduled for today 09/14/2020. Patient verbalized understanding of self-quarantine instructions, appointment time and place.  Patient denies shortness of breath, fever, cough and chest pain at PAT appointment  Anesthesia review: YES, pre-op clearance, previous abnormal labs

## 2020-09-15 NOTE — Anesthesia Preprocedure Evaluation (Addendum)
Anesthesia Evaluation  Patient identified by MRN, date of birth, ID band Patient awake    Reviewed: Allergy & Precautions, NPO status , Patient's Chart, lab work & pertinent test results  Airway Mallampati: I  TM Distance: >3 FB Neck ROM: Limited    Dental  (+) Edentulous Upper, Edentulous Lower, Dental Advisory Given   Pulmonary former smoker,    breath sounds clear to auscultation       Cardiovascular hypertension,  Rhythm:Regular Rate:Normal     Neuro/Psych    GI/Hepatic   Endo/Other    Renal/GU      Musculoskeletal   Abdominal Normal abdominal exam  (+)   Peds  Hematology   Anesthesia Other Findings   Reproductive/Obstetrics                            Anesthesia Physical Anesthesia Plan  ASA: II  Anesthesia Plan: General   Post-op Pain Management:    Induction: Intravenous  PONV Risk Score and Plan: 3 and Ondansetron, Dexamethasone and Midazolam  Airway Management Planned: Oral ETT and Video Laryngoscope Planned  Additional Equipment: None  Intra-op Plan:   Post-operative Plan: Extubation in OR  Informed Consent: I have reviewed the patients History and Physical, chart, labs and discussed the procedure including the risks, benefits and alternatives for the proposed anesthesia with the patient or authorized representative who has indicated his/her understanding and acceptance.     Dental advisory given  Plan Discussed with: CRNA  Anesthesia Plan Comments: (Patient surgery was previously postponed due to abnormal labs at preadmission testing appointment on 08/24/2020.  He subsequently followed up with PCP and had repeat CMP on 08/31/2020 which was WNL.  Patient was subsequently cleared by PCP.  Copy of labs and clearance on chart.  Lab Results      Component                Value               Date                      CREATININE               1.02                09/14/2020                 BUN                      7 (L)               09/14/2020                NA                       133 (L)             09/14/2020                K                        4.1                 09/14/2020                CL  102                 09/14/2020                CO2                      24                  09/14/2020           )       Anesthesia Quick Evaluation

## 2020-09-16 ENCOUNTER — Inpatient Hospital Stay (HOSPITAL_COMMUNITY): Payer: 59 | Admitting: Vascular Surgery

## 2020-09-16 ENCOUNTER — Inpatient Hospital Stay (HOSPITAL_COMMUNITY): Payer: 59

## 2020-09-16 ENCOUNTER — Encounter (HOSPITAL_COMMUNITY): Payer: Self-pay | Admitting: Neurosurgery

## 2020-09-16 ENCOUNTER — Inpatient Hospital Stay (HOSPITAL_COMMUNITY)
Admission: RE | Disposition: A | Discharge status: Home Health | Payer: Self-pay | Source: Home / Self Care | Attending: Neurosurgery

## 2020-09-16 ENCOUNTER — Inpatient Hospital Stay (HOSPITAL_COMMUNITY): Payer: 59 | Admitting: Anesthesiology

## 2020-09-16 ENCOUNTER — Other Ambulatory Visit: Payer: Self-pay

## 2020-09-16 ENCOUNTER — Inpatient Hospital Stay (HOSPITAL_COMMUNITY)
Admission: RE | Admit: 2020-09-16 | Discharge: 2020-09-18 | DRG: 455 | Disposition: A | Discharge status: Home Health | Payer: 59 | Attending: Neurosurgery | Admitting: Neurosurgery

## 2020-09-16 DIAGNOSIS — M48062 Spinal stenosis, lumbar region with neurogenic claudication: Principal | ICD-10-CM | POA: Diagnosis present

## 2020-09-16 DIAGNOSIS — K219 Gastro-esophageal reflux disease without esophagitis: Secondary | ICD-10-CM | POA: Diagnosis present

## 2020-09-16 DIAGNOSIS — Z419 Encounter for procedure for purposes other than remedying health state, unspecified: Secondary | ICD-10-CM

## 2020-09-16 DIAGNOSIS — Z87891 Personal history of nicotine dependence: Secondary | ICD-10-CM

## 2020-09-16 DIAGNOSIS — I10 Essential (primary) hypertension: Secondary | ICD-10-CM | POA: Diagnosis present

## 2020-09-16 DIAGNOSIS — Z981 Arthrodesis status: Secondary | ICD-10-CM

## 2020-09-16 DIAGNOSIS — Z79899 Other long term (current) drug therapy: Secondary | ICD-10-CM | POA: Diagnosis not present

## 2020-09-16 DIAGNOSIS — M5116 Intervertebral disc disorders with radiculopathy, lumbar region: Secondary | ICD-10-CM | POA: Diagnosis present

## 2020-09-16 DIAGNOSIS — J302 Other seasonal allergic rhinitis: Secondary | ICD-10-CM | POA: Diagnosis present

## 2020-09-16 DIAGNOSIS — M4316 Spondylolisthesis, lumbar region: Secondary | ICD-10-CM | POA: Diagnosis present

## 2020-09-16 DIAGNOSIS — Z79891 Long term (current) use of opiate analgesic: Secondary | ICD-10-CM | POA: Diagnosis not present

## 2020-09-16 DIAGNOSIS — Z880 Allergy status to penicillin: Secondary | ICD-10-CM | POA: Diagnosis not present

## 2020-09-16 DIAGNOSIS — Z8669 Personal history of other diseases of the nervous system and sense organs: Secondary | ICD-10-CM | POA: Diagnosis not present

## 2020-09-16 SURGERY — POSTERIOR LUMBAR FUSION 1 LEVEL
Anesthesia: General | Site: Spine Lumbar

## 2020-09-16 MED ORDER — HYDROXYZINE HCL 25 MG PO TABS
50.0000 mg | ORAL_TABLET | Freq: Four times a day (QID) | ORAL | Status: DC | PRN
  Administered 2020-09-16: 50 mg via ORAL
  Administered 2020-09-17: 25 mg via ORAL
  Filled 2020-09-16 (×2): qty 2

## 2020-09-16 MED ORDER — PHENYLEPHRINE 40 MCG/ML (10ML) SYRINGE FOR IV PUSH (FOR BLOOD PRESSURE SUPPORT)
PREFILLED_SYRINGE | INTRAVENOUS | Status: DC | PRN
  Administered 2020-09-16: 80 ug via INTRAVENOUS

## 2020-09-16 MED ORDER — OXYCODONE HCL 5 MG PO TABS
5.0000 mg | ORAL_TABLET | ORAL | Status: DC | PRN
  Administered 2020-09-17 – 2020-09-18 (×3): 5 mg via ORAL
  Filled 2020-09-16 (×2): qty 1

## 2020-09-16 MED ORDER — CYCLOBENZAPRINE HCL 10 MG PO TABS
ORAL_TABLET | ORAL | Status: AC
  Filled 2020-09-16: qty 1

## 2020-09-16 MED ORDER — ACETAMINOPHEN 10 MG/ML IV SOLN: 1000.0000 mg | Freq: Once | INTRAVENOUS | Status: DC | PRN

## 2020-09-16 MED ORDER — BUPIVACAINE LIPOSOME 1.3 % IJ SUSP
20.0000 mL | Freq: Once | INTRAMUSCULAR | Status: DC
  Filled 2020-09-16: qty 20

## 2020-09-16 MED ORDER — BACITRACIN ZINC 500 UNIT/GM EX OINT
TOPICAL_OINTMENT | CUTANEOUS | Status: AC
  Filled 2020-09-16: qty 28.35

## 2020-09-16 MED ORDER — HYDROMORPHONE HCL 1 MG/ML IJ SOLN
INTRAMUSCULAR | Status: AC
  Filled 2020-09-16: qty 1

## 2020-09-16 MED ORDER — HYDROCODONE-ACETAMINOPHEN 5-325 MG PO TABS: 1.0000 | ORAL_TABLET | ORAL | Status: DC | PRN

## 2020-09-16 MED ORDER — LACTATED RINGERS IV SOLN: INTRAVENOUS | Status: DC

## 2020-09-16 MED ORDER — THROMBIN 5000 UNITS EX SOLR
CUTANEOUS | Status: AC
  Filled 2020-09-16: qty 5000

## 2020-09-16 MED ORDER — ACETAMINOPHEN 650 MG RE SUPP: 650.0000 mg | RECTAL | Status: DC | PRN

## 2020-09-16 MED ORDER — MIDAZOLAM HCL 5 MG/5ML IJ SOLN
INTRAMUSCULAR | Status: DC | PRN
  Administered 2020-09-16: 2 mg via INTRAVENOUS

## 2020-09-16 MED ORDER — DOCUSATE SODIUM 100 MG PO CAPS
100.0000 mg | ORAL_CAPSULE | Freq: Two times a day (BID) | ORAL | Status: DC
  Administered 2020-09-17: 100 mg via ORAL
  Filled 2020-09-16: qty 1

## 2020-09-16 MED ORDER — MORPHINE SULFATE (PF) 4 MG/ML IV SOLN: 4.0000 mg | INTRAVENOUS | Status: DC | PRN

## 2020-09-16 MED ORDER — ACETAMINOPHEN 10 MG/ML IV SOLN
INTRAVENOUS | Status: AC
  Filled 2020-09-16: qty 100

## 2020-09-16 MED ORDER — ONDANSETRON HCL 4 MG PO TABS: 4.0000 mg | ORAL_TABLET | Freq: Four times a day (QID) | ORAL | Status: DC | PRN

## 2020-09-16 MED ORDER — SODIUM CHLORIDE 0.9% FLUSH: 3.0000 mL | INTRAVENOUS | Status: DC | PRN

## 2020-09-16 MED ORDER — HYDROMORPHONE HCL 1 MG/ML IJ SOLN
0.2500 mg | INTRAMUSCULAR | Status: DC | PRN
  Administered 2020-09-16 (×5): 0.5 mg via INTRAVENOUS

## 2020-09-16 MED ORDER — FENTANYL CITRATE (PF) 250 MCG/5ML IJ SOLN
INTRAMUSCULAR | Status: AC
  Filled 2020-09-16: qty 5

## 2020-09-16 MED ORDER — AMISULPRIDE (ANTIEMETIC) 5 MG/2ML IV SOLN: 10.0000 mg | Freq: Once | INTRAVENOUS | Status: DC | PRN

## 2020-09-16 MED ORDER — BUPIVACAINE-EPINEPHRINE (PF) 0.5% -1:200000 IJ SOLN
INTRAMUSCULAR | Status: DC | PRN
  Administered 2020-09-16: 10 mL

## 2020-09-16 MED ORDER — ACETAMINOPHEN 160 MG/5ML PO SOLN: 325.0000 mg | Freq: Once | ORAL | Status: DC | PRN

## 2020-09-16 MED ORDER — 0.9 % SODIUM CHLORIDE (POUR BTL) OPTIME
TOPICAL | Status: DC | PRN
  Administered 2020-09-16: 1000 mL

## 2020-09-16 MED ORDER — SUGAMMADEX SODIUM 200 MG/2ML IV SOLN
INTRAVENOUS | Status: DC | PRN
  Administered 2020-09-16: 200 mg via INTRAVENOUS

## 2020-09-16 MED ORDER — CHLORHEXIDINE GLUCONATE 0.12 % MT SOLN
OROMUCOSAL | Status: AC
  Administered 2020-09-16: 15 mL via OROMUCOSAL
  Filled 2020-09-16: qty 15

## 2020-09-16 MED ORDER — MIDAZOLAM HCL 2 MG/2ML IJ SOLN
INTRAMUSCULAR | Status: AC
  Filled 2020-09-16: qty 2

## 2020-09-16 MED ORDER — SODIUM CHLORIDE 0.9% FLUSH
3.0000 mL | Freq: Two times a day (BID) | INTRAVENOUS | Status: DC
  Administered 2020-09-17: 3 mL via INTRAVENOUS

## 2020-09-16 MED ORDER — OXYCODONE-ACETAMINOPHEN 5-325 MG PO TABS: 1.0000 | ORAL_TABLET | ORAL | Status: DC | PRN

## 2020-09-16 MED ORDER — SODIUM CHLORIDE 0.9 % IV SOLN: INTRAVENOUS | Status: DC | PRN

## 2020-09-16 MED ORDER — LIDOCAINE 2% (20 MG/ML) 5 ML SYRINGE
INTRAMUSCULAR | Status: DC | PRN
  Administered 2020-09-16: 40 mg via INTRAVENOUS

## 2020-09-16 MED ORDER — LORATADINE 10 MG PO TABS
10.0000 mg | ORAL_TABLET | Freq: Every evening | ORAL | Status: DC
  Filled 2020-09-16: qty 1

## 2020-09-16 MED ORDER — VANCOMYCIN HCL IN DEXTROSE 1-5 GM/200ML-% IV SOLN: 1000.0000 mg | INTRAVENOUS | Status: AC

## 2020-09-16 MED ORDER — BISACODYL 10 MG RE SUPP: 10.0000 mg | Freq: Every day | RECTAL | Status: DC | PRN

## 2020-09-16 MED ORDER — CHLORHEXIDINE GLUCONATE 0.12 % MT SOLN: 15.0000 mL | Freq: Once | OROMUCOSAL | Status: AC

## 2020-09-16 MED ORDER — PROPOFOL 10 MG/ML IV BOLUS
INTRAVENOUS | Status: DC | PRN
  Administered 2020-09-16: 130 mg via INTRAVENOUS

## 2020-09-16 MED ORDER — GLYCERIN-HYPROMELLOSE-PEG 400 0.2-0.2-1 % OP SOLN: 1.0000 [drp] | Freq: Three times a day (TID) | OPHTHALMIC | Status: DC | PRN

## 2020-09-16 MED ORDER — FENTANYL CITRATE (PF) 250 MCG/5ML IJ SOLN
INTRAMUSCULAR | Status: DC | PRN
  Administered 2020-09-16 (×7): 50 ug via INTRAVENOUS

## 2020-09-16 MED ORDER — VANCOMYCIN HCL IN DEXTROSE 750-5 MG/150ML-% IV SOLN
750.0000 mg | Freq: Two times a day (BID) | INTRAVENOUS | Status: DC
  Administered 2020-09-16 – 2020-09-17 (×2): 750 mg via INTRAVENOUS
  Filled 2020-09-16 (×2): qty 150

## 2020-09-16 MED ORDER — BACITRACIN ZINC 500 UNIT/GM EX OINT
TOPICAL_OINTMENT | CUTANEOUS | Status: DC | PRN
  Administered 2020-09-16: 1 via TOPICAL

## 2020-09-16 MED ORDER — DEXAMETHASONE SODIUM PHOSPHATE 10 MG/ML IJ SOLN
INTRAMUSCULAR | Status: AC
  Filled 2020-09-16: qty 1

## 2020-09-16 MED ORDER — POLYETHYLENE GLYCOL 3350 17 G PO PACK: 17.0000 g | PACK | Freq: Every day | ORAL | Status: DC

## 2020-09-16 MED ORDER — THROMBIN 5000 UNITS EX SOLR
OROMUCOSAL | Status: DC | PRN
  Administered 2020-09-16 (×3): 5 mL via TOPICAL

## 2020-09-16 MED ORDER — SODIUM CHLORIDE 0.9 % IV SOLN: 250.0000 mL | INTRAVENOUS | Status: DC

## 2020-09-16 MED ORDER — CHLORTHALIDONE 25 MG PO TABS: 25.0000 mg | ORAL_TABLET | Freq: Every day | ORAL | Status: DC

## 2020-09-16 MED ORDER — PHENOL 1.4 % MT LIQD
1.0000 | OROMUCOSAL | Status: DC | PRN
  Filled 2020-09-16: qty 177

## 2020-09-16 MED ORDER — CHLORHEXIDINE GLUCONATE CLOTH 2 % EX PADS: 6.0000 | MEDICATED_PAD | Freq: Once | CUTANEOUS | Status: DC

## 2020-09-16 MED ORDER — ONDANSETRON HCL 4 MG/2ML IJ SOLN
INTRAMUSCULAR | Status: DC | PRN
  Administered 2020-09-16: 4 mg via INTRAVENOUS

## 2020-09-16 MED ORDER — MENTHOL 3 MG MT LOZG
1.0000 | LOZENGE | OROMUCOSAL | Status: DC | PRN
  Filled 2020-09-16: qty 9

## 2020-09-16 MED ORDER — ACETAMINOPHEN 325 MG PO TABS
650.0000 mg | ORAL_TABLET | ORAL | Status: DC | PRN
  Administered 2020-09-16 – 2020-09-18 (×4): 650 mg via ORAL
  Filled 2020-09-16 (×4): qty 2

## 2020-09-16 MED ORDER — PANTOPRAZOLE SODIUM 40 MG PO TBEC
40.0000 mg | DELAYED_RELEASE_TABLET | Freq: Every day | ORAL | Status: DC
  Administered 2020-09-17: 40 mg via ORAL
  Filled 2020-09-16: qty 1

## 2020-09-16 MED ORDER — ORAL CARE MOUTH RINSE: 15.0000 mL | Freq: Once | OROMUCOSAL | Status: AC

## 2020-09-16 MED ORDER — ONDANSETRON HCL 4 MG/2ML IJ SOLN: 4.0000 mg | Freq: Four times a day (QID) | INTRAMUSCULAR | Status: DC | PRN

## 2020-09-16 MED ORDER — MIDAZOLAM HCL 2 MG/2ML IJ SOLN
1.0000 mg | Freq: Two times a day (BID) | INTRAMUSCULAR | Status: DC | PRN
  Administered 2020-09-16: 1 mg via INTRAVENOUS

## 2020-09-16 MED ORDER — ACETAMINOPHEN 500 MG PO TABS
1000.0000 mg | ORAL_TABLET | Freq: Four times a day (QID) | ORAL | Status: AC
  Administered 2020-09-17 (×2): 1000 mg via ORAL
  Filled 2020-09-16 (×2): qty 2

## 2020-09-16 MED ORDER — ALBUMIN HUMAN 5 % IV SOLN: INTRAVENOUS | Status: DC | PRN

## 2020-09-16 MED ORDER — HYDROMORPHONE HCL 1 MG/ML IJ SOLN: 0.5000 mg | INTRAMUSCULAR | Status: DC | PRN

## 2020-09-16 MED ORDER — CYCLOBENZAPRINE HCL 10 MG PO TABS
10.0000 mg | ORAL_TABLET | Freq: Three times a day (TID) | ORAL | Status: DC | PRN
  Administered 2020-09-16: 10 mg via ORAL
  Filled 2020-09-16 (×2): qty 1

## 2020-09-16 MED ORDER — ONDANSETRON HCL 4 MG/2ML IJ SOLN
INTRAMUSCULAR | Status: AC
  Filled 2020-09-16: qty 2

## 2020-09-16 MED ORDER — BUPIVACAINE LIPOSOME 1.3 % IJ SUSP
INTRAMUSCULAR | Status: DC | PRN
  Administered 2020-09-16: 20 mL

## 2020-09-16 MED ORDER — MEPERIDINE HCL 25 MG/ML IJ SOLN: 6.2500 mg | INTRAMUSCULAR | Status: DC | PRN

## 2020-09-16 MED ORDER — ROCURONIUM BROMIDE 10 MG/ML (PF) SYRINGE
PREFILLED_SYRINGE | INTRAVENOUS | Status: DC | PRN
  Administered 2020-09-16 (×3): 20 mg via INTRAVENOUS
  Administered 2020-09-16: 60 mg via INTRAVENOUS
  Administered 2020-09-16: 30 mg via INTRAVENOUS

## 2020-09-16 MED ORDER — EPHEDRINE SULFATE-NACL 50-0.9 MG/10ML-% IV SOSY
PREFILLED_SYRINGE | INTRAVENOUS | Status: DC | PRN
  Administered 2020-09-16: 5 mg via INTRAVENOUS

## 2020-09-16 MED ORDER — ACETAMINOPHEN 325 MG PO TABS: 325.0000 mg | ORAL_TABLET | Freq: Once | ORAL | Status: DC | PRN

## 2020-09-16 MED ORDER — ACETAMINOPHEN 10 MG/ML IV SOLN
INTRAVENOUS | Status: DC | PRN
  Administered 2020-09-16: 1000 mg via INTRAVENOUS

## 2020-09-16 MED ORDER — OXYCODONE HCL 5 MG PO TABS
10.0000 mg | ORAL_TABLET | ORAL | Status: DC | PRN
  Administered 2020-09-16 – 2020-09-17 (×4): 10 mg via ORAL
  Filled 2020-09-16 (×5): qty 2

## 2020-09-16 MED ORDER — DEXAMETHASONE SODIUM PHOSPHATE 10 MG/ML IJ SOLN
INTRAMUSCULAR | Status: DC | PRN
  Administered 2020-09-16: 5 mg via INTRAVENOUS

## 2020-09-16 MED ORDER — VANCOMYCIN HCL IN DEXTROSE 1-5 GM/200ML-% IV SOLN
INTRAVENOUS | Status: AC
  Administered 2020-09-16: 1000 mg via INTRAVENOUS
  Filled 2020-09-16: qty 200

## 2020-09-16 MED ORDER — BUPIVACAINE-EPINEPHRINE 0.5% -1:200000 IJ SOLN
INTRAMUSCULAR | Status: AC
  Filled 2020-09-16: qty 1

## 2020-09-16 SURGICAL SUPPLY — 75 items
BAG DECANTER FOR FLEXI CONT (MISCELLANEOUS) ×3 IMPLANT
BASKET BONE COLLECTION (BASKET) ×3 IMPLANT
BENZOIN TINCTURE PRP APPL 2/3 (GAUZE/BANDAGES/DRESSINGS) ×3 IMPLANT
BLADE CLIPPER SURG (BLADE) IMPLANT
BUR MATCHSTICK NEURO 3.0 LAGG (BURR) ×3 IMPLANT
BUR PRECISION FLUTE 6.0 (BURR) ×3 IMPLANT
CANISTER SUCT 3000ML PPV (MISCELLANEOUS) ×3 IMPLANT
CAP LOCK DLX THRD (Cap) ×18 IMPLANT
CARTRIDGE OIL MAESTRO DRILL (MISCELLANEOUS) ×1 IMPLANT
CLOSURE STERI-STRIP 1/2X4 (GAUZE/BANDAGES/DRESSINGS) ×1
CLOSURE WOUND 1/2 X4 (GAUZE/BANDAGES/DRESSINGS) ×1
CLSR STERI-STRIP ANTIMIC 1/2X4 (GAUZE/BANDAGES/DRESSINGS) ×2 IMPLANT
CNTNR URN SCR LID CUP LEK RST (MISCELLANEOUS) ×1 IMPLANT
CONNECTOR CROSS 6.0-6.35X48-60 (Connector) ×3 IMPLANT
CONT SPEC 4OZ STRL OR WHT (MISCELLANEOUS) ×2
COVER BACK TABLE 60X90IN (DRAPES) ×3 IMPLANT
COVER WAND RF STERILE (DRAPES) ×3 IMPLANT
DECANTER SPIKE VIAL GLASS SM (MISCELLANEOUS) ×3 IMPLANT
DIFFUSER DRILL AIR PNEUMATIC (MISCELLANEOUS) ×3 IMPLANT
DRAPE C-ARM 42X72 X-RAY (DRAPES) ×9 IMPLANT
DRAPE HALF SHEET 40X57 (DRAPES) ×3 IMPLANT
DRAPE LAPAROTOMY 100X72X124 (DRAPES) ×3 IMPLANT
DRAPE SURG 17X23 STRL (DRAPES) ×12 IMPLANT
DRSG OPSITE POSTOP 4X6 (GAUZE/BANDAGES/DRESSINGS) ×3 IMPLANT
DRSG OPSITE POSTOP 4X8 (GAUZE/BANDAGES/DRESSINGS) ×3 IMPLANT
ELECT BLADE 4.0 EZ CLEAN MEGAD (MISCELLANEOUS) ×3
ELECT REM PT RETURN 9FT ADLT (ELECTROSURGICAL) ×3
ELECTRODE BLDE 4.0 EZ CLN MEGD (MISCELLANEOUS) ×1 IMPLANT
ELECTRODE REM PT RTRN 9FT ADLT (ELECTROSURGICAL) ×1 IMPLANT
EVACUATOR 1/8 PVC DRAIN (DRAIN) ×3 IMPLANT
GAUZE 4X4 16PLY RFD (DISPOSABLE) ×3 IMPLANT
GAUZE SPONGE 4X4 16PLY XRAY LF (GAUZE/BANDAGES/DRESSINGS) ×3 IMPLANT
GLOVE BIO SURGEON STRL SZ8 (GLOVE) ×6 IMPLANT
GLOVE BIO SURGEON STRL SZ8.5 (GLOVE) ×6 IMPLANT
GLOVE BIOGEL PI IND STRL 7.0 (GLOVE) ×3 IMPLANT
GLOVE BIOGEL PI IND STRL 7.5 (GLOVE) ×3 IMPLANT
GLOVE BIOGEL PI IND STRL 8 (GLOVE) ×2 IMPLANT
GLOVE BIOGEL PI INDICATOR 7.0 (GLOVE) ×6
GLOVE BIOGEL PI INDICATOR 7.5 (GLOVE) ×6
GLOVE BIOGEL PI INDICATOR 8 (GLOVE) ×4
GLOVE EXAM NITRILE XL STR (GLOVE) IMPLANT
GOWN STRL REUS W/ TWL LRG LVL3 (GOWN DISPOSABLE) IMPLANT
GOWN STRL REUS W/ TWL XL LVL3 (GOWN DISPOSABLE) ×2 IMPLANT
GOWN STRL REUS W/TWL 2XL LVL3 (GOWN DISPOSABLE) ×6 IMPLANT
GOWN STRL REUS W/TWL LRG LVL3 (GOWN DISPOSABLE)
GOWN STRL REUS W/TWL XL LVL3 (GOWN DISPOSABLE) ×4
HEMOSTAT POWDER KIT SURGIFOAM (HEMOSTASIS) ×9 IMPLANT
KIT BASIN OR (CUSTOM PROCEDURE TRAY) ×3 IMPLANT
KIT TURNOVER KIT B (KITS) ×3 IMPLANT
MILL MEDIUM DISP (BLADE) IMPLANT
NEEDLE HYPO 21X1.5 SAFETY (NEEDLE) ×3 IMPLANT
NEEDLE HYPO 22GX1.5 SAFETY (NEEDLE) ×3 IMPLANT
NS IRRIG 1000ML POUR BTL (IV SOLUTION) ×3 IMPLANT
OIL CARTRIDGE MAESTRO DRILL (MISCELLANEOUS) ×3
PACK LAMINECTOMY NEURO (CUSTOM PROCEDURE TRAY) ×3 IMPLANT
PAD ARMBOARD 7.5X6 YLW CONV (MISCELLANEOUS) ×9 IMPLANT
PATTIES SURGICAL .5 X1 (DISPOSABLE) IMPLANT
PATTIES SURGICAL 1X1 (DISPOSABLE) ×3 IMPLANT
PUTTY DBM 10CC CALC GRAN (Putty) ×6 IMPLANT
ROD CURVED TI 6.35X65 (Rod) ×6 IMPLANT
SCREW PA DLX CREO 7.5X45 (Screw) ×6 IMPLANT
SCREW PA DLX CREO 7.5X50 (Screw) ×12 IMPLANT
SPACER ALTERA 10X31 8-12MM-8 (Spacer) ×6 IMPLANT
SPONGE LAP 4X18 RFD (DISPOSABLE) IMPLANT
SPONGE NEURO XRAY DETECT 1X3 (DISPOSABLE) IMPLANT
SPONGE SURGIFOAM ABS GEL 100 (HEMOSTASIS) IMPLANT
STRIP CLOSURE SKIN 1/2X4 (GAUZE/BANDAGES/DRESSINGS) ×2 IMPLANT
SUT VIC AB 1 CT1 18XBRD ANBCTR (SUTURE) ×2 IMPLANT
SUT VIC AB 1 CT1 8-18 (SUTURE) ×4
SUT VIC AB 2-0 CP2 18 (SUTURE) ×6 IMPLANT
SYR 20ML LL LF (SYRINGE) IMPLANT
TOWEL GREEN STERILE (TOWEL DISPOSABLE) ×3 IMPLANT
TOWEL GREEN STERILE FF (TOWEL DISPOSABLE) ×3 IMPLANT
TRAY FOLEY MTR SLVR 16FR STAT (SET/KITS/TRAYS/PACK) ×3 IMPLANT
WATER STERILE IRR 1000ML POUR (IV SOLUTION) ×3 IMPLANT

## 2020-09-16 NOTE — Op Note (Signed)
Brief history: The patient is a 69 year old white male who has complained of back and bilateral leg pain consistent with neurogenic claudication.  He failed medical management and was worked up with a lumbar MRI which demonstrated multilevel degenerative changes, facet arthropathy, etc.  He has severe spinal stenosis and spondylolisthesis at L4-5.  I discussed the various treatment options with him.  He has decided to proceed with surgery after weighing the risks, benefits and alternatives.  Preoperative diagnosis: L4-5 spondylolisthesis, facet arthropathy, degenerative disc disease, spinal stenosis compressing both the L4 and the L5 nerve roots; lumbago; lumbar radiculopathy; neurogenic claudication  Postoperative diagnosis: The same  Procedure: Bilateral L4-5 and L5-S1 laminotomy/foraminotomies/medial facetectomy to decompress the bilateral L4, L5 and S1 nerve roots(the work required to do this was in addition to the work required to do the posterior lumbar interbody fusion because of the patient's spinal stenosis, facet arthropathy. Etc. requiring a wide decompression of the nerve roots.);  L4-5 and L5-S1 transforaminal lumbar interbody fusion with local morselized autograft bone and Zimmer DBM; insertion of interbody prosthesis at L4-5 and L5-S1 (globus peek expandable interbody prosthesis); posterior segmental instrumentation from L4 to S1 with globus titanium pedicle screws and rods; posterior lateral arthrodesis at L4-5 and L5-S1 with local morselized autograft bone and Zimmer DBM.  Surgeon: Dr. Earle Gell  Asst.: Arnetha Massy, NP  Anesthesia: Gen. endotracheal  Estimated blood loss: 400 cc  Drains: 1 medium Hemovac in the epidural space  Complications: None  Description of procedure: The patient was brought to the operating room by the anesthesia team. General endotracheal anesthesia was induced. The patient was turned to the prone position on the Wilson frame. The patient's  lumbosacral region was then prepared with Betadine scrub and Betadine solution. Sterile drapes were applied.  I then injected the area to be incised with Marcaine with epinephrine solution. I then used the scalpel to make a linear midline incision over the L4-5 and L5-S1 interspace. I then used electrocautery to perform a bilateral subperiosteal dissection exposing the spinous process and lamina of L4, L5 and S1.. We then obtained two intraoperative radiographs to confirm our location. We then inserted the Verstrac retractor to provide exposure.  The patient had severe facet arthropathy and quite overlapping laminas.  I began the decompression by using the high speed drill to perform laminotomies at bilaterally at what I thought was L4-5. We then used the Kerrison punches to widen the laminotomy and removed the ligamentum flavum at bilaterally. We used the Kerrison punches to remove the medial facets. We performed wide foraminotomies about the bilateral nerve roots completing the decompression.  We brought the intraoperative fluoroscopy into the field and at this point I realized I was at L5-S1.  Because I had already performed bilateral wide facetectomies, at this point I was committed to do a fusion at this level as well.  I then used a high-speed drill to perform bilateral L4-5 laminotomies.  I used the Kerrison punches to remove the ligamentum flavum and decompress the thecal sac.  I remove the bilateral L4-5 medial facets.  I performed the right L4-5 facetectomy with a Kerrison punches.  I performed foraminotomies about the bilateral L4 and L5 nerve roots.  We now turned our attention to the posterior lumbar interbody fusion. I used a scalpel to incise the intervertebral disc at L4-5 and L5-S1 bilaterally. I then performed a partial intervertebral discectomy at L4-5 and L5-S1 bilaterally using the pituitary forceps. We prepared the vertebral endplates at Z6-1 and W9-U0 bilaterally  for the fusion by  removing the soft tissues with the curettes. We then used the trial spacers to pick the appropriate sized interbody prosthesis. We prefilled his prosthesis with a combination of local morselized autograft bone that we obtained during the decompression as well as Zimmer DBM. We inserted the prefilled prosthesis into the interspace at L4-5 and L5-S1, we then turned and expanded the prosthesis. There was a good snug fit of the prosthesis in the interspace. We then filled and the remainder of the intervertebral disc space with local morselized autograft bone and Zimmer DBM. This completed the posterior lumbar interbody arthrodesis.  During the decompression and insertion of the prosthesis the assistant protected the thecal sac and nerve roots with the D'Errico retractor.  We now turned attention to the instrumentation. Under fluoroscopic guidance we cannulated the bilateral L4, L5 and S1 pedicles with the bone probe. We then removed the bone probe. We then tapped the pedicle with a 6.5 millimeter tap. We then removed the tap. We probed inside the tapped pedicle with a ball probe to rule out cortical breaches. We then inserted a 7.5 x 50 and 45 millimeter pedicle screw into the L4, L5 and S1 pedicles bilaterally under fluoroscopic guidance. We then palpated along the medial aspect of the pedicles to rule out cortical breaches. There were none. The nerve roots were not injured. We then connected the unilateral pedicle screws with a lordotic rod. We compressed the construct and secured the rod in place with the caps. We then tightened the caps appropriately.  We placed a cross connector between the rods.  This completed the instrumentation from L4-S1 bilaterally.  We now turned our attention to the posterior lateral arthrodesis at L4-5 and L5-S1 bilaterally. We used the high-speed drill to decorticate the remainder of the facets, pars, transverse process at L4-5 and L5-S1 bilaterally. We then applied a combination of  local morselized autograft bone and Zimmer DBM over these decorticated posterior lateral structures. This completed the posterior lateral arthrodesis.  We then obtained hemostasis using bipolar electrocautery. We irrigated the wound out with bacitracin solution. We inspected the thecal sac and nerve roots and noted they were well decompressed. We then removed the retractor.  I placed a medium Hemovac drain in the epidural space and tunneled it out through a separate stab wound.  We injected Exparel . We reapproximated patient's thoracolumbar fascia with interrupted #1 Vicryl suture. We reapproximated patient's subcutaneous tissue with interrupted 2-0 Vicryl suture. The reapproximated patient's skin with Steri-Strips and benzoin. The wound was then coated with bacitracin ointment. A sterile dressing was applied. The drapes were removed. The patient was subsequently returned to the supine position where they were extubated by the anesthesia team. He was then transported to the post anesthesia care unit in stable condition. All sponge instrument and needle counts were reportedly correct at the end of this case.

## 2020-09-16 NOTE — Transfer of Care (Signed)
Immediate Anesthesia Transfer of Care Note  Patient: Bradley Trujillo  Procedure(s) Performed: POSTERIOR LUMBAR INTERBODY  FUSION, INTERBODY PROSTHESIS, POSTERIOR INSTRUMENTATION LUMBAR FOUR- LUMBAR FIVE, LUMBAR FIVE-SACRAL ONE (N/A Spine Lumbar)  Patient Location: PACU  Anesthesia Type:General  Level of Consciousness: drowsy  Airway & Oxygen Therapy: Patient Spontanous Breathing and Patient connected to face mask oxygen  Post-op Assessment: Report given to RN and Post -op Vital signs reviewed and stable  Post vital signs: Reviewed and stable  Last Vitals:  Vitals Value Taken Time  BP    Temp    Pulse 130 09/16/20 1925  Resp 20 09/16/20 1925  SpO2 99 % 09/16/20 1925  Vitals shown include unvalidated device data.  Last Pain:  Vitals:   09/16/20 1104  TempSrc:   PainSc: 7       Patients Stated Pain Goal: 3 (11/03/34 6701)  Complications: No complications documented.

## 2020-09-16 NOTE — Progress Notes (Signed)
Pharmacy Antibiotic Note  Bradley Trujillo is a 69 y.o. male admitted on 09/16/2020 with spinal decompression. Pharmacy has been consulted for vancomycin dosing for surgical ppx while drain in place. Preop vanco given ~1100, Cr 1 mg/dl on 9/28.  Plan: Vancomycin 750mg  IV q12h Check BMET tomorrow   Height: 5' 6.5" (168.9 cm) Weight: 77.1 kg (169 lb 15.6 oz) IBW/kg (Calculated) : 64.94  Temp (24hrs), Avg:98.3 F (36.8 C), Min:98.1 F (36.7 C), Max:98.5 F (36.9 C)  Recent Labs  Lab 09/14/20 1312  WBC 7.3  CREATININE 1.02    Estimated Creatinine Clearance: 64.5 mL/min (by C-G formula based on SCr of 1.02 mg/dL).    Allergies  Allergen Reactions  . Penicillins Rash    Arrie Senate, PharmD, BCPS Clinical Pharmacist [phone] Please check AMION for all [place] numbers 09/16/2020

## 2020-09-16 NOTE — Anesthesia Postprocedure Evaluation (Signed)
Anesthesia Post Note  Patient: Bradley Trujillo  Procedure(s) Performed: POSTERIOR LUMBAR INTERBODY  FUSION, INTERBODY PROSTHESIS, POSTERIOR INSTRUMENTATION LUMBAR FOUR- LUMBAR FIVE, LUMBAR FIVE-SACRAL ONE (N/A Spine Lumbar)     Patient location during evaluation: PACU Anesthesia Type: General Level of consciousness: awake and alert Pain management: pain level controlled Vital Signs Assessment: post-procedure vital signs reviewed and stable Respiratory status: spontaneous breathing, nonlabored ventilation, respiratory function stable and patient connected to nasal cannula oxygen Cardiovascular status: blood pressure returned to baseline and stable Postop Assessment: no apparent nausea or vomiting Anesthetic complications: no   No complications documented.  Last Vitals:  Vitals:   09/16/20 2015 09/16/20 2030  BP: (!) 130/95 94/61  Pulse: (!) 129 (!) 107  Resp: (!) 23 11  Temp:  36.8 C  SpO2: 98% 91%    Last Pain:  Vitals:   09/16/20 2030  TempSrc:   PainSc: Asleep                 Tyianna Menefee,W. EDMOND

## 2020-09-16 NOTE — Progress Notes (Signed)
Orthopedic Tech Progress Note Patient Details:  Bradley Trujillo February 22, 1951 [id number] Called in brace Patient ID: Bradley Trujillo, male   DOB: [birth year], 69 y.o.   MRN: [id number]   [name] 09/16/2020, 7:25 PM

## 2020-09-16 NOTE — Progress Notes (Signed)
Subjective: The patient is somnolent and complains of back pain.    Objective: Vital signs in last 24 hours: Temp:  [98.1 F (36.7 C)] 98.1 F (36.7 C) (09/30 1028) Pulse Rate:  [93] 93 (09/30 1028) Resp:  [18] 18 (09/30 1028) BP: (173)/(87) 173/87 (09/30 1028) SpO2:  [98 %] 98 % (09/30 1028) Weight:  [77.1 kg] 77.1 kg (09/30 1104) Estimated body mass index is 27.03 kg/m as calculated from the following:   Height as of this encounter: 5' 6.5" (1.689 m).   Weight as of this encounter: 77.1 kg.   Intake/Output from previous day: No intake/output data recorded. Intake/Output this shift: Total I/O In: 500 [I.V.:500] Out: -   Physical exam the patient is somnolent and complains of back pain.  He moves his lower extremities.  He has chronic weakness in his bilateral dorsiflexors and plantar flexors.  Lab Results: Recent Labs    09/14/20 1312  WBC 7.3  HGB 12.9*  HCT 39.6  PLT 243   BMET Recent Labs    09/14/20 1312  NA 133*  K 4.1  CL 102  CO2 24  GLUCOSE 108*  BUN 7*  CREATININE 1.02  CALCIUM 9.3    Studies/Results: DG Lumbar Spine 2-3 Views  Result Date: 09/16/2020 CLINICAL DATA:  Elective surgery. Additional history provided: Posterior lumbar interbody fusion, interbody prosthesis, posterior instrumentation lumbar 4-lumbar 5, lumbar 5-sacral 1. Provided fluoroscopy time: 53 seconds, 36.42 mGy. EXAM: LUMBAR SPINE - 2-3 VIEW; DG C-ARM 1-60 MIN COMPARISON:  Lumbar spine radiographs performed earlier the same day 09/16/2020. Lumbar spine MRI 08/02/2020. FINDINGS: PA and lateral view intraoperative fluoroscopic images of the lumbar spine are submitted, 2 images total. There are now bilateral pedicle screws at L4, L5 and S1. Vertical interconnecting rods were not present at the time the images were taken. L4-L5 and L5-S1 interbody spacers are also now present. Overlying soft tissue retractors. L4-L5 grade 1 anterolisthesis. IMPRESSION: Two intraoperative fluoroscopic  images of the lumbar spine as described. Electronically Signed   By: Kellie Simmering DO   On: 09/16/2020 19:02   DG Lumbar Spine 2-3 Views  Result Date: 09/16/2020 CLINICAL DATA:  Back surgery EXAM: LUMBAR SPINE - 2-3 VIEW COMPARISON:  09/13/2015.  Lumbar MRI 08/02/2020 FINDINGS: Two images  of the lumbar spine obtained in the operating room. Advanced multilevel degenerative change with disc space narrowing spurring and gas in the disc space at L2-3, L3-4, L4-5, L5-S1 Image number 1 demonstrates surgical instrument below the L3 spinous process. Posterior tissue spreaders and sponge. Image number 2 demonstrates instrument posterior to the L5 pedicle IMPRESSION: Lumbar localization in the operating room Electronically Signed   By: Franchot Gallo M.D.   On: 09/16/2020 14:26   DG C-Arm 1-60 Min  Result Date: 09/16/2020 CLINICAL DATA:  Elective surgery. Additional history provided: Posterior lumbar interbody fusion, interbody prosthesis, posterior instrumentation lumbar 4-lumbar 5, lumbar 5-sacral 1. Provided fluoroscopy time: 53 seconds, 36.42 mGy. EXAM: LUMBAR SPINE - 2-3 VIEW; DG C-ARM 1-60 MIN COMPARISON:  Lumbar spine radiographs performed earlier the same day 09/16/2020. Lumbar spine MRI 08/02/2020. FINDINGS: PA and lateral view intraoperative fluoroscopic images of the lumbar spine are submitted, 2 images total. There are now bilateral pedicle screws at L4, L5 and S1. Vertical interconnecting rods were not present at the time the images were taken. L4-L5 and L5-S1 interbody spacers are also now present. Overlying soft tissue retractors. L4-L5 grade 1 anterolisthesis. IMPRESSION: Two intraoperative fluoroscopic images of the lumbar spine as described. Electronically Signed  By: Kellie Simmering DO   On: 09/16/2020 19:02    Assessment/Plan: The patient is doing well.  I spoke with his wife and we discussed that I fused 2 levels.  I have answered all her questions.  LOS: 0 days     Ophelia Charter 09/16/2020, 7:30 PM

## 2020-09-16 NOTE — H&P (Signed)
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  He has developed progressive back and leg pain consistent with neurogenic claudication.  He has failed medical management and was worked up with a lumbar MRI lumbar x-rays which demonstrated an L4-5 spinal listhesis and severe spinal stenosis.  I discussed the various treatment options with him.  He has decided to proceed with surgery after weighing the risks, benefits and alternatives.  Past Medical History:  Diagnosis Date  . Arthritis   . GERD (gastroesophageal reflux disease)   . Guillain-Barre syndrome ([place])   . History of hiatal hernia   . Hypertension   . Seasonal allergies     Past Surgical History:  Procedure Laterality Date  . ANTERIOR CERVICAL DECOMP/DISCECTOMY FUSION N/A 04/23/2013   Procedure: ANTERIOR CERVICAL DECOMPRESSION/DISCECTOMY FUSION 3 LEVELS;  Surgeon: Floyce Stakes, MD;  Location: [facility];  Service: Neurosurgery;  Laterality: N/A;  Cervical three-four, cervical four-five, cervical five-six Anterior cervical decompression/diskectomy/fusion  . APPENDECTOMY    . CERVICAL FUSION     C7    Allergies  Allergen Reactions  . Penicillins Rash    Social History   Tobacco Use  . Smoking status: Former Research scientist (life sciences)  . Smokeless tobacco: Never Used  Substance Use Topics  . Alcohol use: Yes    Alcohol/week: 1.0 standard drink    Types: 1 Cans of beer per week    Comment: socially    History reviewed. No pertinent family history. Prior to Admission medications   Medication Sig Start Date End Date Taking? Authorizing Provider  calcium carbonate (TUMS EX) 750 MG chewable tablet Chew 1 tablet by mouth 3 (three) times daily as needed (indigestion/heartburn.).   Yes [provider]  celecoxib (CELEBREX) 200 MG capsule Take 200 mg by mouth daily. 07/02/20  Yes [provider]  Cholecalciferol (VITAMIN D3) 125 MCG (5000  UT) TABS Take 5,000 Units by mouth daily.   Yes [provider]  Glycerin-Hypromellose-PEG 400 (VISINE DRY EYE) 0.2-0.2-1 % SOLN Place 1 drop into both eyes 3 (three) times daily as needed (dry/irritated eyes.).   Yes [provider]  levocetirizine (XYZAL) 5 MG tablet Take 5 mg by mouth every evening.   Yes [provider]  Multiple Vitamin (MULTIVITAMIN WITH MINERALS) TABS Take 1 tablet by mouth daily. Centrum   Yes [provider]  Multiple Vitamins-Minerals (EMERGEN-C IMMUNE) PACK Take 1 Package by mouth at bedtime.   Yes [provider]  omeprazole (PRILOSEC) 20 MG capsule Take 20 mg by mouth daily.   Yes [provider]  oxyCODONE-acetaminophen (PERCOCET/ROXICET) 5-325 MG tablet Take 2 tablets by mouth daily at 10 pm.   Yes [provider]  Oxymetazoline HCl (SINEX LONG-ACTING NA) Place 1 spray into the nose 2 (two) times daily as needed (congestion).   Yes [provider]  polyethylene glycol (MIRALAX / GLYCOLAX) 17 g packet Take 17 g by mouth at bedtime.   Yes [provider]  Probiotic CAPS Take 1 capsule by mouth daily.   Yes [provider]  chlorthalidone (HYGROTON) 25 MG tablet Take 25 mg by mouth daily at 12 noon.    [provider]  HYDROcodone-acetaminophen (NORCO/VICODIN) 5-325 MG tablet Take 1 tablet by mouth every 6 (six) hours as needed (pain.).  07/27/20   [provider]     Review of Systems  Positive ROS: As above  All other systems have been reviewed and were  otherwise negative with the exception of those mentioned in the HPI and as above.  Objective: Vital signs in last 24 hours: Temp:  [98.1 F (36.7 C)] 98.1 F (36.7 C) (09/30 1028) Pulse Rate:  [93] 93 (09/30 1028) Resp:  [18] 18 (09/30 1028) BP: (173)/(87) 173/87 (09/30 1028) SpO2:  [98 %] 98 % (09/30 1028) Weight:  [77.1 kg] 77.1 kg (09/30 1104) Estimated body mass index is 27.03 kg/m as calculated  from the following:   Height as of this encounter: 5' 6.5" (1.689 m).   Weight as of this encounter: 77.1 kg.   General Appearance: Alert Head: Normocephalic, without obvious abnormality, atraumatic Eyes: PERRL, conjunctiva/corneas clear, EOM's intact,    Ears: Normal  Throat: Normal  Neck: Supple, Back: unremarkable Lungs: Clear to auscultation bilaterally, respirations unlabored Heart: Regular rate and rhythm, no murmur, rub or gallop Abdomen: Soft, non-tender Extremities: Extremities normal, atraumatic, no cyanosis or edema Skin: unremarkable  NEUROLOGIC:   Mental status: alert and oriented,Motor Exam -the patient has 0/5 bilateral dorsi and plantar flexor strength Sensory Exam - grossly normal Reflexes:  Coordination - grossly normal Gait - grossly normal Balance - grossly normal Cranial Nerves: I: smell Not tested  II: visual acuity  OS: Normal  OD: Normal   II: visual fields Full to confrontation  II: pupils Equal, round, reactive to light  III,VII: ptosis None  III,IV,VI: extraocular muscles  Full ROM  V: mastication Normal  V: facial light touch sensation  Normal  V,VII: corneal reflex  Present  VII: facial muscle function - upper  Normal  VII: facial muscle function - lower Normal  VIII: hearing Not tested  IX: soft palate elevation  Normal  IX,X: gag reflex Present  XI: trapezius strength  5/5  XI: sternocleidomastoid strength 5/5  XI: neck flexion strength  5/5  XII: tongue strength  Normal    Data Review Lab Results  Component Value Date   WBC 7.3 09/14/2020   HGB 12.9 (L) 09/14/2020   HCT 39.6 09/14/2020   MCV 89.4 09/14/2020   PLT 243 09/14/2020   Lab Results  Component Value Date   NA 133 (L) 09/14/2020   K 4.1 09/14/2020   CL 102 09/14/2020   CO2 24 09/14/2020   BUN 7 (L) 09/14/2020   CREATININE 1.02 09/14/2020   GLUCOSE 108 (H) 09/14/2020   No results found for: INR, PROTIME  Assessment/Plan: L4-5 spinal thesis, spinal stenosis,  lumbago, lumbar radiculopathy, neurogenic claudication: I have discussed the situation with the patient.  I have reviewed his imaging studies with him and pointed out the abnormalities.  We have discussed the various treatment options including surgery.  I have described the surgical treatment option of an L4-5 decompression, instrumentation and fusion.  I have shown him surgical models.  I have given him a surgical pamphlet.  We have discussed the risks, benefits, alternatives, expected postoperative course, and likelihood of achieving our goals with surgery.  I have answered all his questions.  He has decided to proceed with surgery.   Ophelia Charter 09/16/2020 12:47 PM

## 2020-09-16 NOTE — Anesthesia Procedure Notes (Signed)
Procedure Name: Intubation Date/Time: 09/16/2020 1:10 PM Performed by: Dorthea Cove, CRNA Pre-anesthesia Checklist: Patient identified, Emergency Drugs available, Suction available and Patient being monitored Patient Re-evaluated:Patient Re-evaluated prior to induction Oxygen Delivery Method: Circle system utilized Preoxygenation: Pre-oxygenation with 100% oxygen Induction Type: IV induction Ventilation: Mask ventilation without difficulty and Oral airway inserted - appropriate to patient size Laryngoscope Size: Glidescope and 3 Grade View: Grade I Tube type: Oral Tube size: 7.5 mm Number of attempts: 1 Airway Equipment and Method: Oral airway,  Video-laryngoscopy and Stylet Placement Confirmation: ETT inserted through vocal cords under direct vision,  positive ETCO2 and breath sounds checked- equal and bilateral Secured at: 22 cm Tube secured with: Tape Dental Injury: Teeth and Oropharynx as per pre-operative assessment

## 2020-09-16 NOTE — Progress Notes (Signed)
Patient has history of Guillian-Barre syndrome and is unable to ambulate without his leg braces.  Wife to bring to hospital on 09/17/20.  Dr. Arnoldo Morale aware and okay for patient to go to [facility] without ambulation tonight.

## 2020-09-17 LAB — CBC
HCT: 31.2 % — ABNORMAL LOW (ref 39.0–52.0)
Hemoglobin: 10.4 g/dL — ABNORMAL LOW (ref 13.0–17.0)
MCH: 30 pg (ref 26.0–34.0)
MCHC: 33.3 g/dL (ref 30.0–36.0)
MCV: 89.9 fL (ref 80.0–100.0)
Platelets: 218 10*3/uL (ref 150–400)
RBC: 3.47 MIL/uL — ABNORMAL LOW (ref 4.22–5.81)
RDW: 13.3 % (ref 11.5–15.5)
WBC: 8.9 10*3/uL (ref 4.0–10.5)
nRBC: 0 % (ref 0.0–0.2)

## 2020-09-17 LAB — BASIC METABOLIC PANEL
Anion gap: 10 (ref 5–15)
BUN: 7 mg/dL — ABNORMAL LOW (ref 8–23)
CO2: 23 mmol/L (ref 22–32)
Calcium: 8.7 mg/dL — ABNORMAL LOW (ref 8.9–10.3)
Chloride: 98 mmol/L (ref 98–111)
Creatinine, Ser: 0.94 mg/dL (ref 0.61–1.24)
GFR calc Af Amer: >60 mL/min (ref >60)
GFR calc non Af Amer: >60 mL/min (ref >60)
Glucose, Bld: 98 mg/dL (ref 70–99)
Potassium: 3.9 mmol/L (ref 3.5–5.1)
Sodium: 131 mmol/L — ABNORMAL LOW (ref 135–145)

## 2020-09-17 MED ORDER — MAGNESIUM CITRATE PO SOLN
1.0000 | Freq: Once | ORAL | Status: AC
  Administered 2020-09-17: 1 via ORAL
  Filled 2020-09-17: qty 296

## 2020-09-17 MED FILL — Thrombin For Soln 5000 Unit: CUTANEOUS | Qty: 5000 | Status: AC

## 2020-09-17 MED FILL — Sodium Chloride IV Soln 0.9%: INTRAVENOUS | Qty: 1000 | Status: AC

## 2020-09-17 MED FILL — Heparin Sodium (Porcine) Inj 1000 Unit/ML: INTRAMUSCULAR | Qty: 30 | Status: AC

## 2020-09-17 NOTE — Evaluation (Signed)
Occupational Therapy Evaluation Patient Details Name: Bradley Trujillo MRN: [id number] DOB: 08-27-1951 Today's Date: 09/17/2020    History of Present Illness The patient is a 69 year old white male with a history of Guillain-Barr and chronic dorsiflexor and plantar flexor weakness.  He has developed progressive back and leg pain consistent with neurogenic claudication.  He has failed medical management and was worked up with a lumbar MRI lumbar x-rays which demonstrated an L4-5 spinal listhesis and severe spinal stenosis. PostBilateral L4-5 and L5-S1 laminotomy/foraminotomies/medial facetectomy to decompress the bilateral L4, L5 and S1 nerve roots 09/16/20.   Clinical Impression   Patient admitted with the above diagnosis.  Presents with low back surgical discomfort, LB weakness, mild unsteadiness to dynamic stand balance, mild "loopy" feeling ? Medications per patient; all impacting independence with self care, toilet skills and functional mobility in the room.  At home the patient was able to complete his own self care and toilet skills with minimal modifications to his environment, but did rely on his spouse for meal prep and home management.  OT will follow the patient in the acute setting until discharge.  He plans on discharging home with assist as needed from his spouse.  He does not see a need for [facility] services.      Follow Up Recommendations  No OT follow up;Supervision - Intermittent    Equipment Recommendations    none   Recommendations for Other Services       Precautions / Restrictions Precautions Precautions: Fall;Back Required Braces or Orthoses: Spinal Brace Spinal Brace: Lumbar corset Restrictions Weight Bearing Restrictions: No      Mobility               General bed mobility comments: Patient received up in recliner.  Transfers Overall transfer level: Needs assistance   Transfers: Sit to/from Stand Sit to Stand: Mod assist         General transfer  comment: felt a little "loopy" and noted stiffnes to low back    Balance Overall balance assessment: Needs assistance Sitting-balance support: No upper extremity supported Sitting balance-Leahy Scale: Good     Standing balance support: Bilateral upper extremity supported Standing balance-Leahy Scale: Fair                             ADL either performed or assessed with clinical judgement   ADL Overall ADL's : Needs assistance/impaired Eating/Feeding: Independent;Sitting   Grooming: Wash/dry hands;Wash/dry face;Oral care;Sitting;Independent   Upper Body Bathing: Minimal assistance;Sitting   Lower Body Bathing: Moderate assistance;Sit to/from stand   Upper Body Dressing : Minimal assistance;Sitting   Lower Body Dressing: Moderate assistance;Sit to/from stand                       Vision Baseline Vision/History: No visual deficits Patient Visual Report: No change from baseline       Perception     Praxis      Pertinent Vitals/Pain Pain Assessment: Faces Faces Pain Scale: Hurts little more Pain Location: low back.  Patient cued to loosen brace while sitting. Pain Descriptors / Indicators: Squeezing;Pressure Pain Intervention(s): Monitored during session;Repositioned     Hand Dominance Right   Extremity/Trunk Assessment Upper Extremity Assessment Upper Extremity Assessment: RUE deficits/detail;LUE deficits/detail RUE Deficits / Details: ? RCT limits forward flexion to shoulder. RUE Sensation: WNL RUE Coordination: WNL LUE Deficits / Details: Chronic L bicep tear - limits supination. LUE Sensation: WNL LUE Coordination: WNL  Lower Extremity Assessment Lower Extremity Assessment: Defer to PT evaluation       Communication Communication Communication: No difficulties   Cognition Arousal/Alertness: Awake/alert Behavior During Therapy: WFL for tasks assessed/performed Overall Cognitive Status: Within Functional Limits for tasks  assessed                                 General Comments: felt a little "loopy" due to pain meds per patient.   General Comments                  Home Living Family/patient expects to be discharged to:: Private residence Living Arrangements: Spouse/significant other Available Help at Discharge: Family;Other (Comment) (As needed) Type of Home: House Home Access: Stairs to enter [employer] of Steps: Current home 1, new home 5 to 6 in the front.   Home Layout: One level     Bathroom Shower/Tub: Teacher, early years/pre: Standard Bathroom Accessibility: Yes How Accessible: Accessible via walker Home Equipment: Shower seat;Grab bars - tub/shower   Additional Comments: Has grab bar that attaches to front tub wall.      Prior Functioning/Environment Level of Independence: Needs assistance  Gait / Transfers Assistance Needed: Independent ADL's / Homemaking Assistance Needed: Spouse performs meal prep, home management and community mobility. Communication / Swallowing Assistance Needed: none noted          OT Problem List: Decreased strength;Decreased range of motion;Decreased activity tolerance;Impaired balance (sitting and/or standing);Pain;Increased edema      OT Treatment/Interventions: Self-care/ADL training;Therapeutic exercise;Neuromuscular education;Therapeutic activities;Balance training;Patient/family education    OT Goals(Current goals can be found in the care plan section) Acute Rehab OT Goals Patient Stated Goal: Return home and hopefully move a little better. OT Goal Formulation: With patient Time For Goal Achievement: 09/20/20 Potential to Achieve Goals: Good ADL Goals Pt Will Perform Lower Body Bathing: with modified independence;sit to/from stand Pt Will Perform Lower Body Dressing: with modified independence;sit to/from stand Pt Will Transfer to Toilet: with modified independence;ambulating Additional ADL Goal #1:  Mod I with bed mobility and no VC's for back precautions to increase independence with toileting.  OT Frequency: Min 2X/week   Barriers to D/C:    None noted       Co-evaluation              AM-PAC OT "6 Clicks" Daily Activity     Outcome Measure Help from another person eating meals?: None Help from another person taking care of personal grooming?: None Help from another person toileting, which includes using toliet, bedpan, or urinal?: A Little Help from another person bathing (including washing, rinsing, drying)?: A Lot Help from another person to put on and taking off regular upper body clothing?: A Little Help from another person to put on and taking off regular lower body clothing?: A Lot 6 Click Score: 18   End of Session Equipment Utilized During Treatment: Back brace;Rolling walker Nurse Communication: Other (comment) (Increasing discomfort to low back)  Activity Tolerance: Patient tolerated treatment well Patient left: in chair;with call bell/phone within reach  OT Visit Diagnosis: Unsteadiness on feet (R26.81);Muscle weakness (generalized) (M62.81);Pain Pain - part of body:  (back)                Time: 4696-2952 OT Time Calculation (min): 24 min Charges:  OT General Charges $OT Visit: 1 Visit OT Evaluation $OT Eval Moderate Complexity: 1 Mod OT Treatments $Self Care/Home Management :  8-22 mins  09/17/2020  Rich, OTR/L  Acute Rehabilitation Services  Office:  [place] 09/17/2020, 9:36 AM

## 2020-09-17 NOTE — Evaluation (Signed)
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  He has developed progressive back and leg pain consistent with neurogenic claudication.  He has failed medical management and was worked up with a lumbar MRI lumbar x-rays which demonstrated an L4-5 spinal listhesis and severe spinal stenosis. PostBilateral L4-5 and L5-S1 laminotomy/foraminotomies/medial facetectomy to decompress the bilateral L4, L5 and S1 nerve roots 09/16/20. Medical hx of the following: Guillain-Barr, arthritis, and HTN.  Clinical Impression  The pt presented decreased endurance and LE strength through only being able to tolerate ambulating a short distance with a FWW and min guard. He has chronic lack of strength in his B ankle dorsiflexors and plantarflexors, impacting his push off and feet clearance during gait, placing him at risk for falls. He does own braces and boots to assist him, but they are at home currently. He also displays lack of sensation in his LEs, but reports that some locations have improved since surgery. Pt has been educated on spinal precautions and proper positioning, car transfers, and regular mobility upon discharge home. Pt will benefit from further skilled PT services to address the below mentioned deficits to maximize his safety and independence to return home.    Follow Up Recommendations Home health PT    Equipment Recommendations  None recommended by PT    Recommendations for Other Services       Precautions / Restrictions Precautions Precautions: Fall;Back Precaution Booklet Issued: Yes (comment) (Provided spinal precaution handout and reviewed with pt) Required Braces or Orthoses: Spinal Brace Spinal Brace: Lumbar corset;Applied in sitting  position Restrictions Weight Bearing Restrictions: No      Mobility  Bed Mobility Overal bed mobility: Needs Assistance Bed Mobility: Rolling;Sidelying to Sit Rolling: Supervision Sidelying to sit: Min guard       General bed mobility comments: Upon arrival, pt supine in bed. Attempted to recline bed to flat but pt reported discomfort, thus HOB elevated during bed mobility. VC's to perform log roll to R and to sit up EOB with LEs coming off bed and trunk ascending simultaneously. Educated pt on proper return to supine.  Transfers Overall transfer level: Needs assistance Equipment used: Rolling walker (2 wheeled) Transfers: Sit to/from Stand Sit to Stand: Min guard         General transfer comment: First attempt, pt swiftly returned to sit. Second attempt was successful with VC's for hand placement on bed rather than FWW. VC's to reach posteriorly for target surface prior to returning to sit.  Ambulation/Gait Ambulation/Gait assistance: Min guard Gait Distance (Feet): 40 Feet Assistive device: Rolling walker (2 wheeled) Gait Pattern/deviations: Decreased stride length;Decreased dorsiflexion - right;Decreased dorsiflexion - left;Shuffle (Trunk flexion and B external rotation of LEs)   Gait velocity interpretation: <1.8 ft/sec, indicate of risk for recurrent falls General Gait Details: Pt unable to dorsiflex or plantarflex, thus poor foot clearance and push off. VC's to ensure safety through decreasing risk of foot inversion injury.  Stairs            Wheelchair Mobility    Modified Rankin (Stroke Patients Only)       Balance Overall balance assessment: Needs assistance Sitting-balance support: No upper extremity supported Sitting balance-Leahy Scale: Good     Standing balance support: Bilateral upper extremity supported (FWW) Standing balance-Leahy Scale: Fair (reports dizziness)  Pertinent Vitals/Pain Pain  Assessment: 0-10 Pain Score: 5  Faces Pain Scale: Hurts little more Pain Location: low back. Patient cued to loosen brace to tolerance and positioned comfortably in chair at end of session. Pain Descriptors / Indicators: Pressure;Throbbing;Operative site guarding Pain Intervention(s): Monitored during session;Repositioned;Limited activity within patient's tolerance    Home Living Family/patient expects to be discharged to:: Private residence Living Arrangements: Spouse/significant other;Other relatives (66 year old grandson; Wife works from home) Available Help at Discharge: Family;Other (Comment) (As needed) Type of Home: House Home Access: Stairs to enter Entrance Stairs-Rails: None (at current home) Entrance Stairs-Number of Steps: Current home 1, new home 5 to 6 in the front. (no rails on current home) Home Layout: One level Home Equipment: Shower seat;Grab bars - tub/shower;Walker - 2 wheels;Cane - single point Additional Comments: Has grab bar that attaches to front tub wall; Pt is retired and still drives.    Prior Function Level of Independence: Needs assistance   Gait / Transfers Assistance Needed: Independent  ADL's / Homemaking Assistance Needed: Spouse performs meal prep, home management and community mobility.        Hand Dominance   Dominant Hand: Right    Extremity/Trunk Assessment   Upper Extremity Assessment Upper Extremity Assessment: RUE deficits/detail;Overall [facility] for tasks assessed RUE Deficits / Details: ? RCT limits forward flexion to shoulder. (Reports pain in R shoulder that limits mobility.) RUE Sensation: WNL RUE Coordination: WNL LUE Deficits / Details: Chronic L bicep tear - limits supination. LUE Sensation: WNL LUE Coordination: WNL    Lower Extremity Assessment Lower Extremity Assessment: Generalized weakness;RLE deficits/detail;LLE deficits/detail RLE Deficits / Details: Chronic inability to dorisflex or plantarflex foot. (Utilizes braces  or boots at home.) RLE:  (0/5 PF and DF, 4+ to 5 in quads and hamstrings) RLE Sensation: decreased light touch (improved some compared to before surgery, per pt) LLE Deficits / Details: Chronic inability to dorisflex or plantarflex foot. (Utilizes braces or boots at home.) LLE:  (0/5 PF and DF, 4+ to 5 in quads and hamstrings) LLE Sensation: decreased light touch (improved some compared to before surgery, per pt)    Cervical / Trunk Assessment Cervical / Trunk Assessment: Other exceptions (surgery at L4-S1)  Communication   Communication: No difficulties  Cognition Arousal/Alertness: Awake/alert Behavior During Therapy: WFL for tasks assessed/performed Overall Cognitive Status: Within Functional Limits for tasks assessed                                 General Comments: Reported dizziness on occasion with mobility.      General Comments General comments (skin integrity, edema, etc.): 3+ edema B LEs    Exercises     Assessment/Plan    PT Assessment Patient needs continued PT services  PT Problem List Decreased strength;Decreased activity tolerance;Decreased balance;Decreased mobility;Decreased coordination;Decreased safety awareness;Decreased knowledge of precautions;Impaired sensation;Pain       PT Treatment Interventions Gait training;Stair training;Functional mobility training;Therapeutic activities;Therapeutic exercise;Balance training;Neuromuscular re-education;Patient/family education    PT Goals (Current goals can be found in the Care Plan section)  Acute Rehab PT Goals Patient Stated Goal: Return home and hopefully move a little better. PT Goal Formulation: With patient Time For Goal Achievement: 09/24/20 Potential to Achieve Goals: Good    Frequency Min 5X/week   Barriers to discharge        Co-evaluation               AM-PAC PT "6  Clicks" Mobility  Outcome Measure Help needed turning from your back to your side while in a flat bed  without using bedrails?: A Little Help needed moving from lying on your back to sitting on the side of a flat bed without using bedrails?: A Little Help needed moving to and from a bed to a chair (including a wheelchair)?: A Little Help needed standing up from a chair using your arms (e.g., wheelchair or bedside chair)?: A Little Help needed to walk in hospital room?: A Little Help needed climbing 3-5 steps with a railing? : A Little 6 Click Score: 18    End of Session Equipment Utilized During Treatment: Gait belt;Back brace Activity Tolerance: Patient limited by fatigue;Patient limited by pain Patient left: in chair;with call bell/phone within reach   PT Visit Diagnosis: Unsteadiness on feet (R26.81);Other abnormalities of gait and mobility (R26.89);Muscle weakness (generalized) (M62.81);Difficulty in walking, not elsewhere classified (R26.2);Pain Pain - part of body:  (incision site in back)    Time: 8375-4237 PT Time Calculation (min) (ACUTE ONLY): 34 min   Charges:   PT Evaluation $PT Eval Moderate Complexity: 1 Mod PT Treatments $Gait Training: 8-22 mins        Bradley Trujillo, PT, DPT Acute Rehabilitation Services  Pager: [phone] Office: [phone]   Orvan Falconer 09/17/2020, 10:48 AM

## 2020-09-17 NOTE — Progress Notes (Signed)
Subjective: The patient is alert and pleasant.  He looks well.  His back is appropriately sore.  He tells me that preoperatively he has had some issues with urinary retention and mentions some chronic perineal numbness.  Objective: Vital signs in last 24 hours: Temp:  [98.1 F (36.7 C)-98.9 F (37.2 C)] 98.3 F (36.8 C) (10/01 0341) Pulse Rate:  [93-131] 97 (10/01 0341) Resp:  [11-29] 18 (10/01 0341) BP: (94-173)/(61-103) 125/84 (10/01 0341) SpO2:  [91 %-100 %] 100 % (10/01 0341) Weight:  [77.1 kg] 77.1 kg (09/30 1104) Estimated body mass index is 27.03 kg/m as calculated from the following:   Height as of this encounter: 5' 6.5" (1.689 m).   Weight as of this encounter: 77.1 kg.   Intake/Output from previous day: 09/30 0701 - 10/01 0700 In: 3715 [P.O.:240; I.V.:2500; Blood:175; IV Piggyback:800] Out: 2220 [Urine:1500; Drains:320; Blood:400] Intake/Output this shift: No intake/output data recorded.  Physical exam the patient is alert and pleasant.  He continues to have 0/5 bilateral dorsiflexor and plantar flexor strength since he was a teenager from [employer].  Lab Results: Recent Labs    09/14/20 1312  WBC 7.3  HGB 12.9*  HCT 39.6  PLT 243   BMET Recent Labs    09/14/20 1312  NA 133*  K 4.1  CL 102  CO2 24  GLUCOSE 108*  BUN 7*  CREATININE 1.02  CALCIUM 9.3    Studies/Results: DG Lumbar Spine 2-3 Views  Result Date: 09/16/2020 CLINICAL DATA:  Elective surgery. Additional history provided: Posterior lumbar interbody fusion, interbody prosthesis, posterior instrumentation lumbar 4-lumbar 5, lumbar 5-sacral 1. Provided fluoroscopy time: 53 seconds, 36.42 mGy. EXAM: LUMBAR SPINE - 2-3 VIEW; DG C-ARM 1-60 MIN COMPARISON:  Lumbar spine radiographs performed earlier the same day 09/16/2020. Lumbar spine MRI 08/02/2020. FINDINGS: PA and lateral view intraoperative fluoroscopic images of the lumbar spine are submitted, 2 images total. There are now bilateral  pedicle screws at L4, L5 and S1. Vertical interconnecting rods were not present at the time the images were taken. L4-L5 and L5-S1 interbody spacers are also now present. Overlying soft tissue retractors. L4-L5 grade 1 anterolisthesis. IMPRESSION: Two intraoperative fluoroscopic images of the lumbar spine as described. Electronically Signed   By: Kellie Simmering DO   On: 09/16/2020 19:02   DG Lumbar Spine 2-3 Views  Result Date: 09/16/2020 CLINICAL DATA:  Back surgery EXAM: LUMBAR SPINE - 2-3 VIEW COMPARISON:  09/13/2015.  Lumbar MRI 08/02/2020 FINDINGS: Two images  of the lumbar spine obtained in the operating room. Advanced multilevel degenerative change with disc space narrowing spurring and gas in the disc space at L2-3, L3-4, L4-5, L5-S1 Image number 1 demonstrates surgical instrument below the L3 spinous process. Posterior tissue spreaders and sponge. Image number 2 demonstrates instrument posterior to the L5 pedicle IMPRESSION: Lumbar localization in the operating room Electronically Signed   By: Franchot Gallo M.D.   On: 09/16/2020 14:26   DG C-Arm 1-60 Min  Result Date: 09/16/2020 CLINICAL DATA:  Elective surgery. Additional history provided: Posterior lumbar interbody fusion, interbody prosthesis, posterior instrumentation lumbar 4-lumbar 5, lumbar 5-sacral 1. Provided fluoroscopy time: 53 seconds, 36.42 mGy. EXAM: LUMBAR SPINE - 2-3 VIEW; DG C-ARM 1-60 MIN COMPARISON:  Lumbar spine radiographs performed earlier the same day 09/16/2020. Lumbar spine MRI 08/02/2020. FINDINGS: PA and lateral view intraoperative fluoroscopic images of the lumbar spine are submitted, 2 images total. There are now bilateral pedicle screws at L4, L5 and S1. Vertical interconnecting rods were not present  at the time the images were taken. L4-L5 and L5-S1 interbody spacers are also now present. Overlying soft tissue retractors. L4-L5 grade 1 anterolisthesis. IMPRESSION: Two intraoperative fluoroscopic images of the lumbar  spine as described. Electronically Signed   By: Kellie Simmering DO   On: 09/16/2020 19:02    Assessment/Plan: Postop day #1: The patient is doing well.  I explained to him why I performed a two-level lumbar fusion.  We will mobilize him with PT and OT.  It looks like he will need another day in the hospital with his chronic bilateral foot weakness.  He would likely go home tomorrow with home PT.  I have answered all his questions.  LOS: 1 day     Ophelia Charter 09/17/2020, 7:32 AM     Patient ID: Bradley Trujillo, Bradley Trujillo   DOB: 1951-01-23, 69 y.o.   MRN: [id number]

## 2020-09-17 NOTE — TOC Progression Note (Signed)
Transition of Care [facility]) - Progression Note    Patient Details  Name: Bradley Trujillo MRN: [id number] Date of Birth: [birth year]  Transition of Care [facility]) CM/SW [place], RN Phone Number: [phone]  09/17/2020, 11:05 AM  Clinical Narrative:    CM consulted for [facility]. Choices offered to patient and patient asked CM to all his wife Bradley Trujillo to discuss with her. CM offered wife choice. Home health has been set up  With [facility]. The wife has been made aware that the agency will contact her for start of service dates. No further needs noted at this time.     Barriers to Discharge: Continued Medical Work up  Expected Discharge Plan and Services                           DME Arranged: N/A         HH Arranged: PT, OT [place]: [place] Date [place]: 09/17/20 Time [place]: 1104 Representative spoke with at [place]: [place] Determinants of Health ([place]) Interventions    Readmission Risk Interventions No flowsheet data found.

## 2020-09-18 MED ORDER — METHOCARBAMOL 750 MG PO TABS: 750.0000 mg | ORAL_TABLET | Freq: Three times a day (TID) | ORAL | 1 refills | Status: DC | PRN

## 2020-09-18 MED ORDER — OXYCODONE-ACETAMINOPHEN 5-325 MG PO TABS: 1.0000 | ORAL_TABLET | ORAL | 0 refills | Status: AC | PRN

## 2020-09-18 NOTE — Progress Notes (Signed)
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  He has developed progressive back and leg pain consistent with neurogenic claudication.  He has failed medical management and was worked up with a lumbar MRI lumbar x-rays which demonstrated an L4-5 spinal listhesis and severe spinal stenosis. PostBilateral L4-5 and L5-S1 laminotomy/foraminotomies/medial facetectomy to decompress the bilateral L4, L5 and S1 nerve roots 09/16/20. Medical hx of the following: Guillain-Barr, arthritis, and HTN.   OT comments  Pt seated in recliner upon arrival. Pt required minA for LB dressing and toilet transfer. Pt had 1x minor LOB toward end of session requiring physical assistance from therapist to correct. Pt demonstrated poor adherence to back precautions during functional mobility and required max cues to maintain no BLT. Pt able to verbalize back precautions but requires cues for maintaining during functional activities (grooming at sink, toilet transfer/hygiene, and LB dressing). Pt demonstrated ability to don/doff back brace. Continued to educate pt on proper wear schedule for back brace. Pt with anticipated d/c this date. Should he remain in acute care, will continue to follow.    Follow Up Recommendations  No OT follow up;Supervision - Intermittent    Equipment Recommendations  Tub/shower bench    Recommendations for Other Services      Precautions / Restrictions Precautions Precautions: Fall;Back Precaution Booklet Issued: Yes (comment) (Provided spinal precaution handout and reviewed with pt) Required Braces or Orthoses: Spinal Brace Spinal Brace: Lumbar corset;Applied in sitting position Restrictions Weight Bearing Restrictions: No       Mobility Bed Mobility                General bed mobility comments: pt sitting in recliner upon arrival  Transfers Overall transfer level: Needs assistance Equipment used: Rolling walker (2 wheeled) Transfers: Sit to/from Stand Sit to Stand: Min assist         General transfer comment: minor instability/minor LOB noted toward end of session requiring physical assistance from therapist to stabilize    Balance Overall balance assessment: Needs assistance Sitting-balance support: No upper extremity supported Sitting balance-Leahy Scale: Good     Standing balance support: Single extremity supported;During functional activity Standing balance-Leahy Scale: Fair                             ADL either performed or assessed with clinical judgement   ADL Overall ADL's : Needs assistance/impaired     Grooming: Standing;Min guard;Wash/dry hands Grooming Details (indicate cue type and reason): standing at sink to wash his hands, required max cues for adherence to BLT         Upper Body Dressing : Set up;Sitting Upper Body Dressing Details (indicate cue type and reason): pt dressed UB seated in recliner, able to don/doff back brace Lower Body Dressing: Minimal assistance;Sit to/from stand Lower Body Dressing Details (indicate cue type and reason): pt able to figure-4 minA for stability and cues to reduce forward flexion at trunk, pt dressed LB seated in recliner Toilet Transfer: Minimal assistance;RW Toilet Transfer Details (indicate cue type and reason): cues to maintain upright posture and proper use of RW (stand inside RW) Toileting- Water quality scientist and Hygiene: Min guard;Sit to/from stand Toileting - Clothing Manipulation Details (indicate cue type and reason): minguard for safety and adherence to back precautions  Functional mobility during ADLs: Minimal assistance;Rolling walker General ADL Comments: minA intermittently due to instability from BLE weakness and cues to  maintain upright posture and proper use of RW     Vision       Perception     Praxis      Cognition Arousal/Alertness: Awake/alert Behavior During Therapy: WFL for tasks assessed/performed Overall Cognitive Status: Within Functional Limits for tasks assessed                                 General Comments: required frequent cues for adherence to back precautions        Exercises     Shoulder Instructions       General Comments pt reporting diarrhea, notified RN    Pertinent Vitals/ Pain       Pain Assessment: 0-10 Pain Score: 5  Pain Location: low back Pain Descriptors / Indicators: Pressure;Throbbing;Operative site guarding Pain Intervention(s): Monitored during session;Limited activity within patient's tolerance  Home Living                                          Prior Functioning/Environment              Frequency  Min 2X/week        Progress Toward Goals  OT Goals(current goals can now be found in the care plan section)  Progress towards OT goals: Progressing toward goals  Acute Rehab OT Goals Patient Stated Goal: Return home and hopefully move a little better. OT Goal Formulation: With patient Time For Goal Achievement: 09/20/20 Potential to Achieve Goals: Good ADL Goals Pt Will Perform Lower Body Bathing: with modified independence;sit to/from stand Pt Will Perform Lower Body Dressing: with modified independence;sit to/from stand Pt Will Transfer to Toilet: with modified independence;ambulating Additional ADL Goal #1: Mod I with bed mobility and no VC's for back precautions to increase independence with toileting.  Plan Discharge plan remains appropriate    Co-evaluation                 AM-PAC OT "6 Clicks" Daily Activity     Outcome Measure   Help from another person eating meals?: None Help from another person taking care of personal grooming?: None Help from another person toileting, which  includes using toliet, bedpan, or urinal?: A Little Help from another person bathing (including washing, rinsing, drying)?: A Lot Help from another person to put on and taking off regular upper body clothing?: A Little Help from another person to put on and taking off regular lower body clothing?: A Lot 6 Click Score: 18    End of Session Equipment Utilized During Treatment: Back brace;Rolling walker  OT Visit Diagnosis: Unsteadiness on feet (R26.81);Muscle weakness (generalized) (M62.81);Pain Pain - part of body:  (back)   Activity Tolerance Patient tolerated treatment well   Patient Left in chair;with call bell/phone within reach   Nurse Communication Other (comment);Mobility status (Increasing discomfort to low back)        Time: 0350-0938 OT Time Calculation (min): 37 min  Charges: OT General Charges $OT Visit: 1 Visit OT Treatments $Self Care/Home Management : 23-37 mins  Helene Kelp OTR/L Acute Rehabilitation Services Office: [facility] 09/18/2020, 10:31 AM

## 2020-09-18 NOTE — Progress Notes (Signed)
Patient is discharged from room 3C07 at this time. Alert and in stable condition. IV site d/c'd and instructions read to patient and spouse with understanding verbalized and all questions answered. Left unit via wheelchair with all belongings at side. 

## 2020-09-18 NOTE — Progress Notes (Signed)
  NEUROSURGERY PROGRESS NOTE   No issues overnight.  No concerns this am Eager for discharge  EXAM:  BP 106/70 (BP Location: Left Arm)   Pulse (!) 101   Temp 100.2 F (37.9 C) (Oral)   Resp 18   Ht 5' 6.5" (1.689 m)   Wt 77.1 kg   SpO2 97%   BMI 27.03 kg/m   Awake, alert, oriented  Speech fluent, appropriate  CN grossly intact  5/5 BUE/BLE except chronic bilateral foot weakness grom GBS at age 69  IMPRESSION/PLAN Doing well D/c home

## 2020-09-18 NOTE — Discharge Instructions (Signed)

## 2020-09-18 NOTE — Progress Notes (Signed)
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 Pt is now s/p L4-5 and L5-S1 laminotomy/foraminotomies/medial facetectomy to decompress the bilateral L4, L5 and S1 nerve roots 09/16/20. PMH also significant for C7 cervical fusion and ACDF 2014 (C3-C6), and HTN.    PT Comments    Pt progressing towards physical therapy goals. Was able to perform transfers and ambulation with gross min guard assist for safety and RW for support. Pt reporting the only way he has been able to sleep at home is by scooting R hip off the edge of the chair, twisting trunk to the L and leaning forward onto ~4 stacked pillows. We discussed some safe ways to position at home that would not be breaking back precautions. He was also educated on brace application/wearing schedule, car transfer, and appropriate activity progression. Will continue to follow.     Follow Up Recommendations  Home health PT     Equipment Recommendations  None recommended by PT    Recommendations for Other Services       Precautions / Restrictions Precautions Precautions: Fall;Back Precaution Booklet Issued: Yes (comment) Precaution Comments: Reviewed precautions during functional mobility.  Required Braces or Orthoses: Spinal Brace Spinal Brace: Lumbar corset;Applied in sitting position Restrictions Weight Bearing Restrictions: No    Mobility  Bed Mobility Overal bed mobility: Needs Assistance             General bed mobility comments: Pt was recieved in long sitting in bed with trunk very flexed and anteriorly leaned with UE's resting on legs. Pt was educated on proper positioning recommendations, and pt pivoted hips around to sit EOB.   Transfers Overall transfer level: Needs assistance Equipment used:  Rolling walker (2 wheeled) Transfers: Sit to/from Stand Sit to Stand: Min guard         General transfer comment: Pt appears unsteady and hands-on guarding provided for safety  Ambulation/Gait Ambulation/Gait assistance: Min guard Gait Distance (Feet): 75 Feet Assistive device: Rolling walker (2 wheeled) Gait Pattern/deviations: Decreased stride length;Decreased dorsiflexion - right;Decreased dorsiflexion - left;Shuffle (Trunk flexion and B external rotation of LEs) Gait velocity: Decreased Gait velocity interpretation: <1.31 ft/sec, indicative of household ambulator General Gait Details: Pt unable to dorsiflex or plantarflex, thus poor foot clearance and push off. VC's to ensure safety through decreasing risk of foot inversion injury.   Stairs             Wheelchair Mobility    Modified Rankin (Stroke Patients Only)       Balance Overall balance assessment: Needs assistance Sitting-balance support: No upper extremity supported Sitting balance-Leahy Scale: Good     Standing balance support: Single extremity supported;During functional activity Standing balance-Leahy Scale: Fair                              Cognition Arousal/Alertness: Awake/alert Behavior During Therapy: WFL for tasks assessed/performed Overall Cognitive Status: Within Functional Limits for tasks assessed                                 General Comments: required frequent cues for adherence to back precautions      Exercises      General Comments General comments (skin integrity, edema,  etc.): pt reporting diarrhea, notified RN      Pertinent Vitals/Pain Pain Assessment: 0-10 Pain Score: 5  Pain Location: low back Pain Descriptors / Indicators: Pressure;Operative site guarding Pain Intervention(s): Limited activity within patient's tolerance;Monitored during session;Repositioned    Home Living                      Prior Function            PT  Goals (current goals can now be found in the care plan section) Acute Rehab PT Goals Patient Stated Goal: Return home and hopefully move a little better. PT Goal Formulation: With patient Time For Goal Achievement: 09/24/20 Potential to Achieve Goals: Good Progress towards PT goals: Progressing toward goals    Frequency    Min 5X/week      PT Plan Current plan remains appropriate    Co-evaluation              AM-PAC PT "6 Clicks" Mobility   Outcome Measure  Help needed turning from your back to your side while in a flat bed without using bedrails?: A Little Help needed moving from lying on your back to sitting on the side of a flat bed without using bedrails?: A Little Help needed moving to and from a bed to a chair (including a wheelchair)?: A Little Help needed standing up from a chair using your arms (e.g., wheelchair or bedside chair)?: A Little Help needed to walk in hospital room?: A Little Help needed climbing 3-5 steps with a railing? : A Little 6 Click Score: 18    End of Session Equipment Utilized During Treatment: Gait belt;Back brace Activity Tolerance: Patient limited by fatigue;Patient limited by pain Patient left: in chair;with call bell/phone within reach (OT present) Nurse Communication: Mobility status PT Visit Diagnosis: Unsteadiness on feet (R26.81);Other abnormalities of gait and mobility (R26.89);Muscle weakness (generalized) (M62.81);Difficulty in walking, not elsewhere classified (R26.2);Pain Pain - part of body:  (incision site in back)     Time: 6967-8938 PT Time Calculation (min) (ACUTE ONLY): 29 min  Charges:  $Gait Training: 23-37 mins                     Rolinda Roan, PT, DPT Acute Rehabilitation Services Pager: [phone] Office: [phone]    Thelma Comp 09/18/2020, 1:42 PM

## 2020-09-18 NOTE — Discharge Summary (Signed)
Physician Discharge Summary  Patient ID: Bradley Trujillo MRN: [id number] DOB/AGE: Dec 22, 1950 69 y.o.  Admit date: 09/16/2020 Discharge date: 09/18/2020  Admission Diagnoses:  Lumbar spondylolisthesis  Discharge Diagnoses:  Same Active Problems:   Spondylolisthesis of lumbar region   Discharged Condition: Stable  Hospital Course:  Bradley Trujillo is a 69 y.o. male who was admitted for the below procedure. There were no post operative complications. At time of discharge, pain was well controlled, ambulating with Pt/OT, tolerating po, voiding normal. Ready for discharge.  Treatments: Surgery Bilateral L4-5 and L5-S1 laminotomy/foraminotomies/medial facetectomy to decompress the bilateral L4, L5 and S1 nerve roots(the work required to do this was in addition to the work required to do the posterior lumbar interbody fusion because of the patient's spinal stenosis, facet arthropathy. Etc. requiring a wide decompression of the nerve roots.);  L4-5 and L5-S1 transforaminal lumbar interbody fusion with local morselized autograft bone and Zimmer DBM; insertion of interbody prosthesis at L4-5 and L5-S1 (globus peek expandable interbody prosthesis); posterior segmental instrumentation from L4 to S1 with globus titanium pedicle screws and rods; posterior lateral arthrodesis at L4-5 and L5-S1 with local morselized autograft bone and Zimmer DBM.  Discharge Exam: Blood pressure 106/70, pulse (!) 101, temperature 100.2 F (37.9 C), temperature source Oral, resp. rate 18, height 5' 6.5" (1.689 m), weight 77.1 kg, SpO2 97 %. Awake, alert, oriented Speech fluent, appropriate CN grossly intact 5/5 BUE/BLE, chronic bilateral foot weakness Wound c/d/i  Disposition: Discharge disposition: 01-Home or Self Care       Discharge Instructions    Call MD for:  difficulty breathing, headache or visual disturbances   Complete by: As directed    Call MD for:  persistant dizziness or light-headedness    Complete by: As directed    Call MD for:  redness, tenderness, or signs of infection (pain, swelling, redness, odor or green/yellow discharge around incision site)   Complete by: As directed    Call MD for:  severe uncontrolled pain   Complete by: As directed    Call MD for:  temperature >100.4   Complete by: As directed    Diet - low sodium heart healthy   Complete by: As directed    Driving Restrictions   Complete by: As directed    Do not drive until given clearance.   Increase activity slowly   Complete by: As directed    Lifting restrictions   Complete by: As directed    Do not lift anything >10lbs. Avoid bending and twisting in awkward positions. Avoid bending at the back.   May shower / Bathe   Complete by: As directed    In 24 hours. Okay to wash wound with warm soapy water. Avoid scrubbing the wound. Pat dry.   No wound care   Complete by: As directed      Allergies as of 09/18/2020      Reactions   Penicillins Rash      Medication List    STOP taking these medications   HYDROcodone-acetaminophen 5-325 MG tablet Commonly known as: NORCO/VICODIN     TAKE these medications   calcium carbonate 750 MG chewable tablet Commonly known as: TUMS EX Chew 1 tablet by mouth 3 (three) times daily as needed (indigestion/heartburn.).   celecoxib 200 MG capsule Commonly known as: CELEBREX Take 200 mg by mouth daily.   chlorthalidone 25 MG tablet Commonly known as: HYGROTON Take 25 mg by mouth daily at 12 noon.   Emergen-C Immune Pack Take 1 Package by  mouth at bedtime.   levocetirizine 5 MG tablet Commonly known as: XYZAL Take 5 mg by mouth every evening.   methocarbamol 750 MG tablet Commonly known as: Robaxin-750 Take 1 tablet (750 mg total) by mouth 3 (three) times daily as needed for muscle spasms.   multivitamin with minerals Tabs tablet Take 1 tablet by mouth daily. Centrum   omeprazole 20 MG capsule Commonly known as: PRILOSEC Take 20 mg by mouth  daily.   oxyCODONE-acetaminophen 5-325 MG tablet Commonly known as: PERCOCET/ROXICET Take 1-2 tablets by mouth every 4 (four) hours as needed for severe pain. What changed:   how much to take  when to take this  reasons to take this   polyethylene glycol 17 g packet Commonly known as: MIRALAX / GLYCOLAX Take 17 g by mouth at bedtime.   Probiotic Caps Take 1 capsule by mouth daily.   SINEX LONG-ACTING NA Place 1 spray into the nose 2 (two) times daily as needed (congestion).   Visine Dry Eye 0.2-0.2-1 % Soln Generic drug: Glycerin-Hypromellose-PEG 400 Place 1 drop into both eyes 3 (three) times daily as needed (dry/irritated eyes.).   Vitamin D3 125 MCG (5000 UT) Tabs Take 5,000 Units by mouth daily.       Follow-up Information    [facility] Follow up.   Specialty: Home Health Services Why: Your home health has been set up with [facility]. The agency will call you with the start of service date. If you have any questions please call number listed above.  Contact information: [place] [address] [phone]        Newman Pies, MD Follow up.   Specialty: Neurosurgery Contact information: [address] [place] [address] [phone]               Signed: Traci Sermon 09/18/2020, 8:10 AM

## 2021-10-28 IMAGING — CR DG LUMBAR SPINE 2-3V
2 series · 2 of 2 positions shown · non-contrast
Comparison: 09/13/2015.  Lumbar MRI 08/02/2020

CLINICAL DATA: Back surgery

EXAM:
LUMBAR SPINE - 2-3 VIEW

[lateral (1 of 2)]
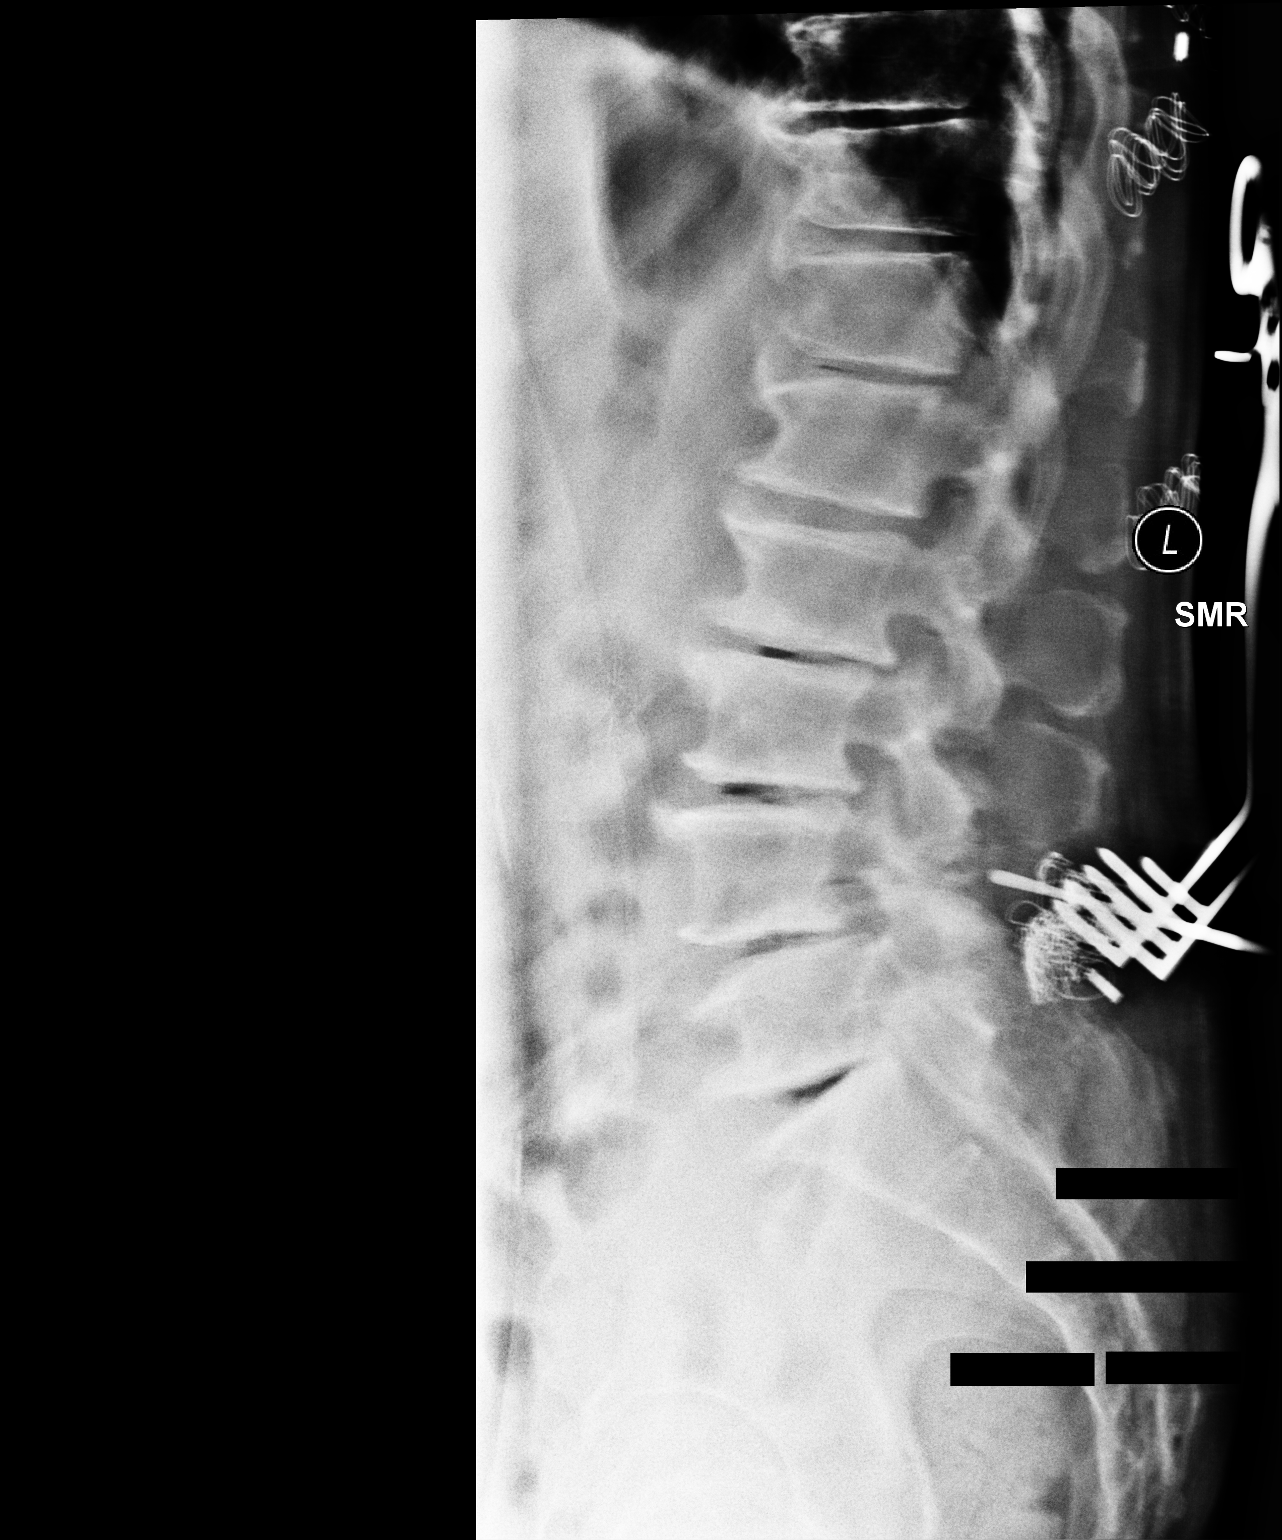

[lateral (2 of 2)]
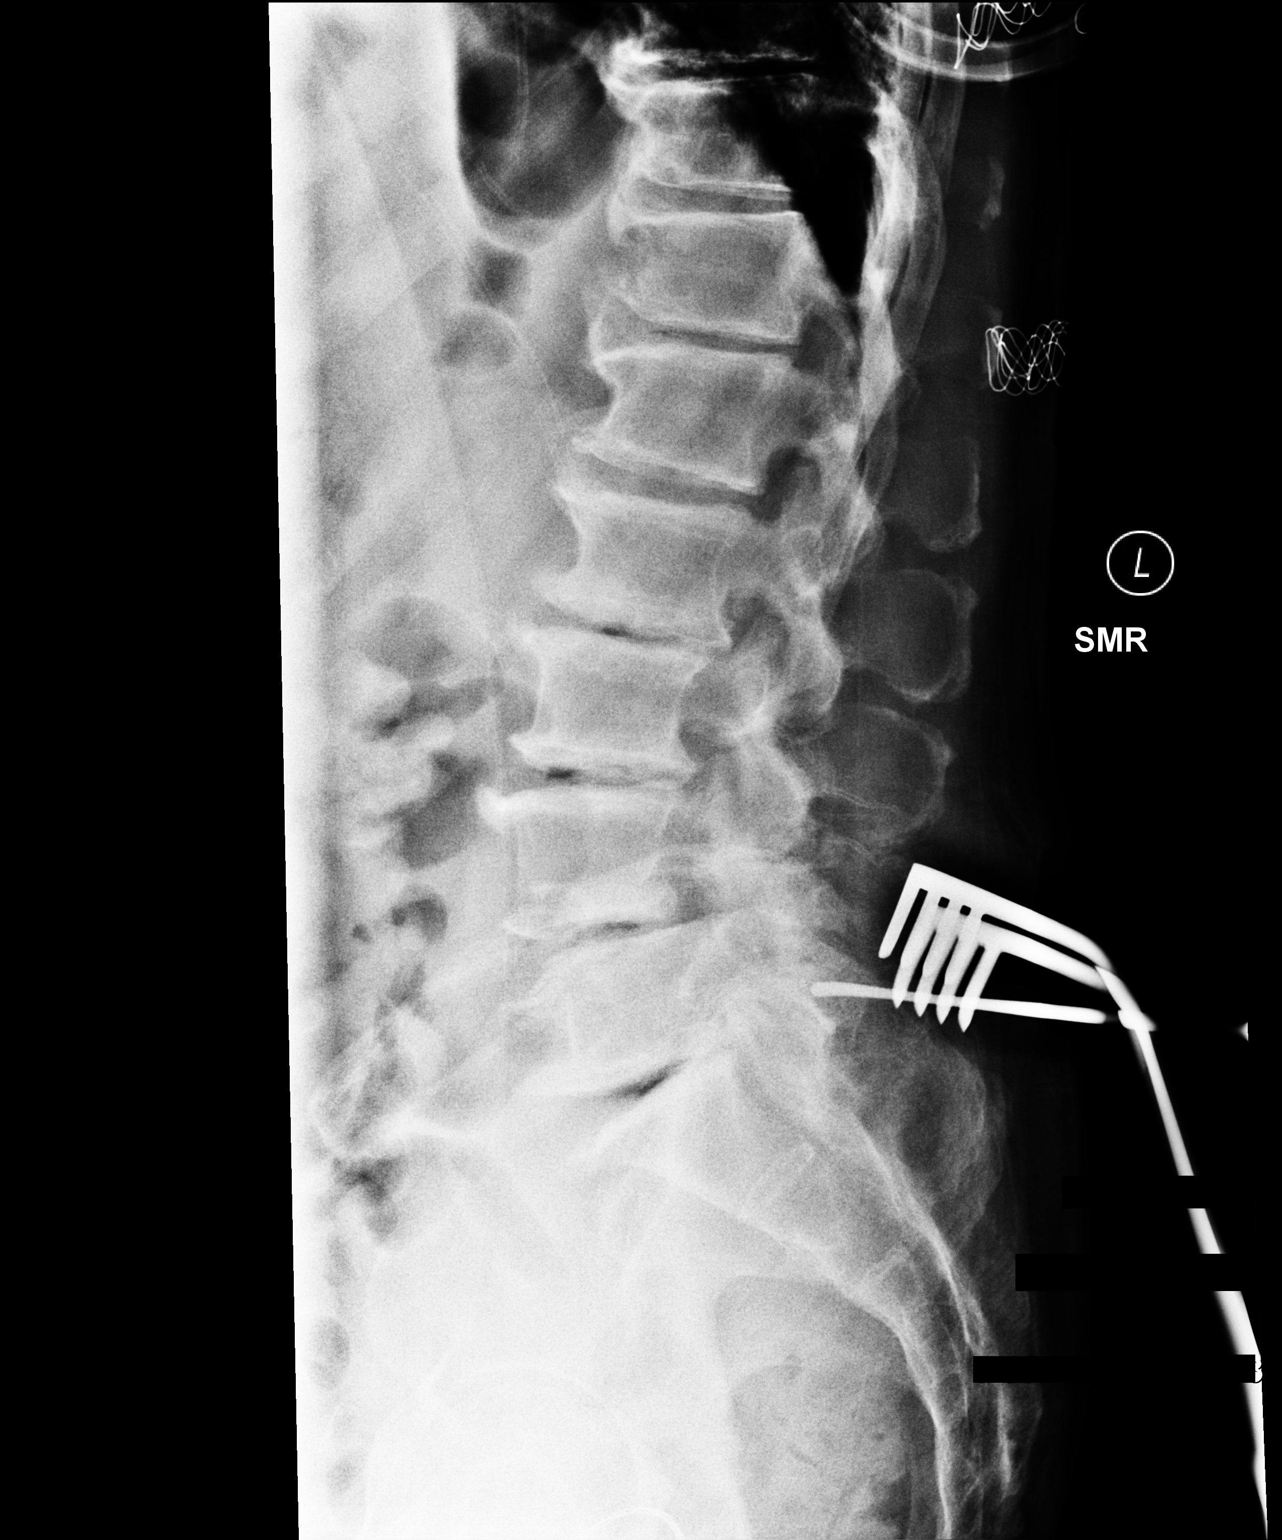

[2 of 2 positions shown; findings below may reference images not displayed]

FINDINGS: Two images  of the lumbar spine obtained in the operating room.

Advanced multilevel degenerative change with disc space narrowing
spurring and gas in the disc space at L2-3, L3-4, L4-5, L5-S1

Image number 1 demonstrates surgical instrument below the L3 spinous
process. Posterior tissue spreaders and sponge.

Image number 2 demonstrates instrument posterior to the L5 pedicle
IMPRESSION: Lumbar localization in the operating room

## 2022-10-17 ENCOUNTER — Encounter: Payer: Self-pay | Admitting: Internal Medicine

## 2022-11-16 ENCOUNTER — Ambulatory Visit: Payer: 59 | Admitting: Gastroenterology

## 2022-12-28 ENCOUNTER — Encounter: Payer: Self-pay | Admitting: Internal Medicine

## 2022-12-28 ENCOUNTER — Ambulatory Visit: Payer: 59 | Admitting: Internal Medicine

## 2023-02-07 ENCOUNTER — Ambulatory Visit (INDEPENDENT_AMBULATORY_CARE_PROVIDER_SITE_OTHER): Payer: 59 | Admitting: Internal Medicine

## 2023-02-07 ENCOUNTER — Encounter: Payer: Self-pay | Admitting: Internal Medicine

## 2023-02-07 ENCOUNTER — Encounter (INDEPENDENT_AMBULATORY_CARE_PROVIDER_SITE_OTHER): Payer: Self-pay | Admitting: *Deleted

## 2023-02-07 ENCOUNTER — Telehealth (INDEPENDENT_AMBULATORY_CARE_PROVIDER_SITE_OTHER): Payer: Self-pay | Admitting: *Deleted

## 2023-02-07 VITALS — BP 130/86 | HR 91 | Temp 98.9°F | Ht 61.0 in | Wt 177.0 lb

## 2023-02-07 DIAGNOSIS — D509 Iron deficiency anemia, unspecified: Secondary | ICD-10-CM

## 2023-02-07 DIAGNOSIS — R5383 Other fatigue: Secondary | ICD-10-CM | POA: Diagnosis not present

## 2023-02-07 DIAGNOSIS — K219 Gastro-esophageal reflux disease without esophagitis: Secondary | ICD-10-CM

## 2023-02-07 DIAGNOSIS — Z800 Family history of malignant neoplasm of digestive organs: Secondary | ICD-10-CM

## 2023-02-07 MED ORDER — PEG 3350-KCL-NA BICARB-NACL 420 G PO SOLR: 4000.0000 mL | Freq: Once | ORAL | 0 refills | Status: AC

## 2023-02-07 NOTE — H&P (View-Only) (Signed)
Primary Care Physician:  Wannetta Sender, FNP Primary Gastroenterologist:  Dr. Abbey Chatters  Chief Complaint  Patient presents with   Anemia    Patient referred today due to having a low hgb 7.9 in September. Patient denies any dark or bloody stools. He says he is having issues with shortness of breath, not feeling fatigue, No dizziness, no chest pain or palpitations. He is not taking an iron pill.    HPI:   Bradley Trujillo is a 72 y.o. male who presents to the clinic today by referral from his PCP Loletha Grayer for evaluation for microcytic anemia.  Patient was he had blood work done in September which showed hemoglobin 7.9.  Appears he had anemia back in October 2021 as well as hemoglobin 10.4.  History of B12 deficiency though states he no longer takes this.  No iron studies that I see.  Patient denies any gross melena or hematochezia.  No previous EGD or colonoscopy.  Does note his brother died from colon cancer at age 95.  Chronic reflux well-controlled omeprazole 20 mg daily.  Symptoms mild, intermittent.  Chronically takes Celebrex for knee pain.  No other NSAID use.  Denies any abdominal pain.  No unintentional weight loss.  Does note some chronic fatigue.  States that he cannot be active for too long before he gets tired easily.  Past Medical History:  Diagnosis Date   Arthritis    GERD (gastroesophageal reflux disease)    Guillain-Barre syndrome (HCC)    History of hiatal hernia    Hypertension    Seasonal allergies     Past Surgical History:  Procedure Laterality Date   ANTERIOR CERVICAL DECOMP/DISCECTOMY FUSION N/A 04/23/2013   Procedure: ANTERIOR CERVICAL DECOMPRESSION/DISCECTOMY FUSION 3 LEVELS;  Surgeon: Floyce Stakes, MD;  Location: [facility];  Service: Neurosurgery;  Laterality: N/A;  Cervical three-four, cervical four-five, cervical five-six Anterior cervical decompression/diskectomy/fusion   APPENDECTOMY     CERVICAL FUSION     C7    Current  Outpatient Medications  Medication Sig Dispense Refill   calcium carbonate (TUMS EX) 750 MG chewable tablet Chew 1 tablet by mouth 3 (three) times daily as needed (indigestion/heartburn.).     celecoxib (CELEBREX) 200 MG capsule Take 200 mg by mouth daily.     Cholecalciferol (VITAMIN D3) 125 MCG (5000 UT) TABS Take 10,000 Units by mouth every other day.     Glycerin-Hypromellose-PEG 400 (VISINE DRY EYE) 0.2-0.2-1 % SOLN Place 1 drop into both eyes 3 (three) times daily as needed (dry/irritated eyes.).     levocetirizine (XYZAL) 5 MG tablet Take 5 mg by mouth every evening.     loratadine (CLARITIN) 10 MG tablet Take 10 mg by mouth daily as needed for allergies.     Multiple Vitamin (MULTIVITAMIN WITH MINERALS) TABS Take 1 tablet by mouth daily. Centrum     Multiple Vitamins-Minerals (EMERGEN-C IMMUNE) PACK Take 1 Package by mouth at bedtime.     Omega-3 Fatty Acids (FISH OIL) 1200 MG CAPS Take 1,200 mg by mouth daily at 6 (six) AM.     omeprazole (PRILOSEC) 20 MG capsule Take 20 mg by mouth daily.     oxyCODONE-acetaminophen (PERCOCET/ROXICET) 5-325 MG tablet Take 1-2 tablets by mouth every 4 (four) hours as needed for severe pain. 60 tablet 0   Oxymetazoline HCl (SINEX LONG-ACTING NA) Place 1 spray into the nose 2 (two) times daily as needed (congestion).     Probiotic CAPS Take 1 capsule by mouth daily.  zinc gluconate 50 MG tablet Take 50 mg by mouth daily.     No current facility-administered medications for this visit.    Allergies as of 02/07/2023 - Review Complete 02/07/2023  Allergen Reaction Noted   Penicillins Rash 04/17/2013    History reviewed. No pertinent family history.  Social History   Socioeconomic History   Marital status: Married    Spouse name: Not on file   Number of children: Not on file   Years of education: Not on file   Highest education level: Not on file  Occupational History   Not on file  Tobacco Use   Smoking status: Former   Smokeless  tobacco: Never  Vaping Use   Vaping Use: Former  Substance and Sexual Activity   Alcohol use: Yes    Alcohol/week: 1.0 standard drink of alcohol    Types: 1 Cans of beer per week    Comment: socially   Drug use: No   Sexual activity: Not on file  Other Topics Concern   Not on file  Social History Narrative   Not on file   Social Determinants of Health   Financial Resource Strain: Not on file  Food Insecurity: Not on file  Transportation Needs: Not on file  Physical Activity: Not on file  Stress: Not on file  Social Connections: Not on file  Intimate Partner Violence: Not on file    Subjective: Review of Systems  Constitutional:  Negative for chills and fever.  HENT:  Negative for congestion and hearing loss.   Eyes:  Negative for blurred vision and double vision.  Respiratory:  Negative for cough and shortness of breath.   Cardiovascular:  Negative for chest pain and palpitations.  Gastrointestinal:  Negative for abdominal pain, blood in stool, constipation, diarrhea, heartburn, melena and vomiting.  Genitourinary:  Negative for dysuria and urgency.  Musculoskeletal:  Negative for joint pain and myalgias.  Skin:  Negative for itching and rash.  Neurological:  Negative for dizziness and headaches.  Psychiatric/Behavioral:  Negative for depression. The patient is not nervous/anxious.        Objective: BP 130/86 (BP Location: Left Arm, Patient Position: Sitting, Cuff Size: Normal)   Pulse 91   Temp 98.9 F (37.2 C) (Oral)   Ht '5\' 1"'$  (1.549 m)   Wt 177 lb (80.3 kg)   BMI 33.44 kg/m  Physical Exam Constitutional:      Appearance: Normal appearance.  HENT:     Head: Normocephalic and atraumatic.  Eyes:     Extraocular Movements: Extraocular movements intact.     Conjunctiva/sclera: Conjunctivae normal.  Cardiovascular:     Rate and Rhythm: Normal rate and regular rhythm.  Pulmonary:     Effort: Pulmonary effort is normal.     Breath sounds: Normal breath  sounds.  Abdominal:     General: Bowel sounds are normal.     Palpations: Abdomen is soft.  Musculoskeletal:        General: Normal range of motion.     Cervical back: Normal range of motion and neck supple.  Skin:    General: Skin is warm.  Neurological:     General: No focal deficit present.     Mental Status: He is alert and oriented to person, place, and time.  Psychiatric:        Mood and Affect: Mood normal.        Behavior: Behavior normal.      Assessment: *Chronic GERD-well controlled on Omeprazole 20 mg daily *  Microcytic Anemia  *Family history of colon cancer in brother-died age 72 *Fatigue  Plan: Will check CBC and iron studies today.   Will schedule for EGD to evaluate for peptic ulcer disease, esophagitis, gastritis, H. Pylori, duodenitis, or other. Will also evaluate for esophageal stricture, Schatzki's ring, esophageal web or other.   At the same time, will perform colonoscopy to further evaluate.   The risks including infection, bleed, or perforation as well as benefits, limitations, alternatives and imponderables have been reviewed with the patient. Potential for esophageal dilation, biopsy, etc. have also been reviewed.  Questions have been answered. All parties agreeable.  Thank you Loletha Grayer for the kind referral.  02/07/2023 2:39 PM   Disclaimer: This note was dictated with voice recognition software. Similar sounding words can inadvertently be transcribed and may not be corrected upon review.

## 2023-02-07 NOTE — Telephone Encounter (Signed)
PA approved via cohere. Auth# Authorization [id number], DOS: 03/02/2023 - 05/02/2023

## 2023-02-07 NOTE — Patient Instructions (Signed)
I am going to check a CBC and iron studies today at [facility] lab down the hall.  We will call with results.  We will schedule you for upper endoscopy and colonoscopy to further evaluate your anemia.  Continue on omeprazole daily.  It was very nice meeting both you today.  Dr. Abbey Chatters

## 2023-02-07 NOTE — Progress Notes (Signed)
Primary Care Physician:  Wannetta Sender, FNP Primary Gastroenterologist:  Dr. Abbey Chatters  Chief Complaint  Patient presents with   Anemia    Patient referred today due to having a low hgb 7.9 in September. Patient denies any dark or bloody stools. He says he is having issues with shortness of breath, not feeling fatigue, No dizziness, no chest pain or palpitations. He is not taking an iron pill.    HPI:   Bradley Trujillo is a 72 y.o. male who presents to the clinic today by referral from his PCP Loletha Grayer for evaluation for microcytic anemia.  Patient was he had blood work done in September which showed hemoglobin 7.9.  Appears he had anemia back in October 2021 as well as hemoglobin 10.4.  History of B12 deficiency though states he no longer takes this.  No iron studies that I see.  Patient denies any gross melena or hematochezia.  No previous EGD or colonoscopy.  Does note his brother died from colon cancer at age 43.  Chronic reflux well-controlled omeprazole 20 mg daily.  Symptoms mild, intermittent.  Chronically takes Celebrex for knee pain.  No other NSAID use.  Denies any abdominal pain.  No unintentional weight loss.  Does note some chronic fatigue.  States that he cannot be active for too long before he gets tired easily.  Past Medical History:  Diagnosis Date   Arthritis    GERD (gastroesophageal reflux disease)    Guillain-Barre syndrome (HCC)    History of hiatal hernia    Hypertension    Seasonal allergies     Past Surgical History:  Procedure Laterality Date   ANTERIOR CERVICAL DECOMP/DISCECTOMY FUSION N/A 04/23/2013   Procedure: ANTERIOR CERVICAL DECOMPRESSION/DISCECTOMY FUSION 3 LEVELS;  Surgeon: Floyce Stakes, MD;  Location: [facility];  Service: Neurosurgery;  Laterality: N/A;  Cervical three-four, cervical four-five, cervical five-six Anterior cervical decompression/diskectomy/fusion   APPENDECTOMY     CERVICAL FUSION     C7    Current  Outpatient Medications  Medication Sig Dispense Refill   calcium carbonate (TUMS EX) 750 MG chewable tablet Chew 1 tablet by mouth 3 (three) times daily as needed (indigestion/heartburn.).     celecoxib (CELEBREX) 200 MG capsule Take 200 mg by mouth daily.     Cholecalciferol (VITAMIN D3) 125 MCG (5000 UT) TABS Take 10,000 Units by mouth every other day.     Glycerin-Hypromellose-PEG 400 (VISINE DRY EYE) 0.2-0.2-1 % SOLN Place 1 drop into both eyes 3 (three) times daily as needed (dry/irritated eyes.).     levocetirizine (XYZAL) 5 MG tablet Take 5 mg by mouth every evening.     loratadine (CLARITIN) 10 MG tablet Take 10 mg by mouth daily as needed for allergies.     Multiple Vitamin (MULTIVITAMIN WITH MINERALS) TABS Take 1 tablet by mouth daily. Centrum     Multiple Vitamins-Minerals (EMERGEN-C IMMUNE) PACK Take 1 Package by mouth at bedtime.     Omega-3 Fatty Acids (FISH OIL) 1200 MG CAPS Take 1,200 mg by mouth daily at 6 (six) AM.     omeprazole (PRILOSEC) 20 MG capsule Take 20 mg by mouth daily.     oxyCODONE-acetaminophen (PERCOCET/ROXICET) 5-325 MG tablet Take 1-2 tablets by mouth every 4 (four) hours as needed for severe pain. 60 tablet 0   Oxymetazoline HCl (SINEX LONG-ACTING NA) Place 1 spray into the nose 2 (two) times daily as needed (congestion).     Probiotic CAPS Take 1 capsule by mouth daily.  zinc gluconate 50 MG tablet Take 50 mg by mouth daily.     No current facility-administered medications for this visit.    Allergies as of 02/07/2023 - Review Complete 02/07/2023  Allergen Reaction Noted   Penicillins Rash 04/17/2013    History reviewed. No pertinent family history.  Social History   Socioeconomic History   Marital status: Married    Spouse name: Not on file   Number of children: Not on file   Years of education: Not on file   Highest education level: Not on file  Occupational History   Not on file  Tobacco Use   Smoking status: Former   Smokeless  tobacco: Never  Vaping Use   Vaping Use: Former  Substance and Sexual Activity   Alcohol use: Yes    Alcohol/week: 1.0 standard drink of alcohol    Types: 1 Cans of beer per week    Comment: socially   Drug use: No   Sexual activity: Not on file  Other Topics Concern   Not on file  Social History Narrative   Not on file   Social Determinants of Health   Financial Resource Strain: Not on file  Food Insecurity: Not on file  Transportation Needs: Not on file  Physical Activity: Not on file  Stress: Not on file  Social Connections: Not on file  Intimate Partner Violence: Not on file    Subjective: Review of Systems  Constitutional:  Negative for chills and fever.  HENT:  Negative for congestion and hearing loss.   Eyes:  Negative for blurred vision and double vision.  Respiratory:  Negative for cough and shortness of breath.   Cardiovascular:  Negative for chest pain and palpitations.  Gastrointestinal:  Negative for abdominal pain, blood in stool, constipation, diarrhea, heartburn, melena and vomiting.  Genitourinary:  Negative for dysuria and urgency.  Musculoskeletal:  Negative for joint pain and myalgias.  Skin:  Negative for itching and rash.  Neurological:  Negative for dizziness and headaches.  Psychiatric/Behavioral:  Negative for depression. The patient is not nervous/anxious.        Objective: BP 130/86 (BP Location: Left Arm, Patient Position: Sitting, Cuff Size: Normal)   Pulse 91   Temp 98.9 F (37.2 C) (Oral)   Ht 5' 1"$  (1.549 m)   Wt 177 lb (80.3 kg)   BMI 33.44 kg/m  Physical Exam Constitutional:      Appearance: Normal appearance.  HENT:     Head: Normocephalic and atraumatic.  Eyes:     Extraocular Movements: Extraocular movements intact.     Conjunctiva/sclera: Conjunctivae normal.  Cardiovascular:     Rate and Rhythm: Normal rate and regular rhythm.  Pulmonary:     Effort: Pulmonary effort is normal.     Breath sounds: Normal breath  sounds.  Abdominal:     General: Bowel sounds are normal.     Palpations: Abdomen is soft.  Musculoskeletal:        General: Normal range of motion.     Cervical back: Normal range of motion and neck supple.  Skin:    General: Skin is warm.  Neurological:     General: No focal deficit present.     Mental Status: He is alert and oriented to person, place, and time.  Psychiatric:        Mood and Affect: Mood normal.        Behavior: Behavior normal.      Assessment: *Chronic GERD-well controlled on Omeprazole 20 mg daily *  Microcytic Anemia  *Family history of colon cancer in brother-died age 33 *Fatigue  Plan: Will check CBC and iron studies today.   Will schedule for EGD to evaluate for peptic ulcer disease, esophagitis, gastritis, H. Pylori, duodenitis, or other. Will also evaluate for esophageal stricture, Schatzki's ring, esophageal web or other.   At the same time, will perform colonoscopy to further evaluate.   The risks including infection, bleed, or perforation as well as benefits, limitations, alternatives and imponderables have been reviewed with the patient. Potential for esophageal dilation, biopsy, etc. have also been reviewed.  Questions have been answered. All parties agreeable.  Thank you Loletha Grayer for the kind referral.  02/07/2023 2:39 PM   Disclaimer: This note was dictated with voice recognition software. Similar sounding words can inadvertently be transcribed and may not be corrected upon review.

## 2023-02-08 ENCOUNTER — Other Ambulatory Visit: Payer: Self-pay

## 2023-02-08 ENCOUNTER — Encounter (INDEPENDENT_AMBULATORY_CARE_PROVIDER_SITE_OTHER): Payer: Self-pay | Admitting: *Deleted

## 2023-02-08 ENCOUNTER — Encounter (HOSPITAL_COMMUNITY): Payer: Self-pay

## 2023-02-08 ENCOUNTER — Telehealth: Payer: Self-pay

## 2023-02-08 ENCOUNTER — Encounter: Payer: Self-pay | Admitting: Internal Medicine

## 2023-02-08 ENCOUNTER — Observation Stay (HOSPITAL_COMMUNITY)
Admission: EM | Admit: 2023-02-08 | Discharge: 2023-02-09 | Disposition: A | Discharge status: Home / Self Care | Payer: 59 | Attending: Internal Medicine | Admitting: Internal Medicine

## 2023-02-08 DIAGNOSIS — Z79899 Other long term (current) drug therapy: Secondary | ICD-10-CM | POA: Diagnosis not present

## 2023-02-08 DIAGNOSIS — M1990 Unspecified osteoarthritis, unspecified site: Secondary | ICD-10-CM | POA: Diagnosis present

## 2023-02-08 DIAGNOSIS — E871 Hypo-osmolality and hyponatremia: Secondary | ICD-10-CM | POA: Diagnosis present

## 2023-02-08 DIAGNOSIS — K219 Gastro-esophageal reflux disease without esophagitis: Secondary | ICD-10-CM | POA: Diagnosis present

## 2023-02-08 DIAGNOSIS — I10 Essential (primary) hypertension: Secondary | ICD-10-CM | POA: Diagnosis not present

## 2023-02-08 DIAGNOSIS — R5383 Other fatigue: Secondary | ICD-10-CM | POA: Diagnosis not present

## 2023-02-08 DIAGNOSIS — D649 Anemia, unspecified: Secondary | ICD-10-CM | POA: Diagnosis not present

## 2023-02-08 DIAGNOSIS — Z87891 Personal history of nicotine dependence: Secondary | ICD-10-CM | POA: Diagnosis not present

## 2023-02-08 DIAGNOSIS — R5382 Chronic fatigue, unspecified: Secondary | ICD-10-CM

## 2023-02-08 LAB — COMPREHENSIVE METABOLIC PANEL
ALT: 10 U/L (ref 0–44)
AST: 20 U/L (ref 15–41)
Albumin: 3.5 g/dL (ref 3.5–5.0)
Alkaline Phosphatase: 59 U/L (ref 38–126)
Anion gap: 5 (ref 5–15)
BUN: 16 mg/dL (ref 8–23)
CO2: 23 mmol/L (ref 22–32)
Calcium: 8.4 mg/dL — ABNORMAL LOW (ref 8.9–10.3)
Chloride: 105 mmol/L (ref 98–111)
Creatinine, Ser: 1.22 mg/dL (ref 0.61–1.24)
GFR, Estimated: >60 mL/min (ref >60)
Glucose, Bld: 106 mg/dL — ABNORMAL HIGH (ref 70–99)
Potassium: 3.9 mmol/L (ref 3.5–5.1)
Sodium: 133 mmol/L — ABNORMAL LOW (ref 135–145)
Total Bilirubin: 0.4 mg/dL (ref 0.3–1.2)
Total Protein: 6.6 g/dL (ref 6.5–8.1)

## 2023-02-08 LAB — CBC WITH DIFFERENTIAL/PLATELET
Abs Immature Granulocytes: 0.02 10*3/uL (ref 0.00–0.07)
Absolute Monocytes: 460 cells/uL (ref 200–950)
Basophils Absolute: 101 cells/uL (ref 0–200)
Basophils Absolute: 0.1 10*3/uL (ref 0.0–0.1)
Basophils Relative: 1.6 %
Basophils Relative: 2 %
Eosinophils Absolute: 69 cells/uL (ref 15–500)
Eosinophils Absolute: 0.1 10*3/uL (ref 0.0–0.5)
Eosinophils Relative: 1.1 %
Eosinophils Relative: 2 %
HCT: 24 % — ABNORMAL LOW (ref 38.5–50.0)
HCT: 23.6 % — ABNORMAL LOW (ref 39.0–52.0)
Hemoglobin: 6.4 g/dL — ABNORMAL LOW (ref 13.2–17.1)
Hemoglobin: 6.3 g/dL — CL (ref 13.0–17.0)
Immature Granulocytes: 0 %
Lymphocytes Relative: 26 %
Lymphs Abs: 1008 cells/uL (ref 850–3900)
Lymphs Abs: 1.5 10*3/uL (ref 0.7–4.0)
MCH: 15.9 pg — ABNORMAL LOW (ref 27.0–33.0)
MCH: 16 pg — ABNORMAL LOW (ref 26.0–34.0)
MCHC: 26.7 g/dL — ABNORMAL LOW (ref 32.0–36.0)
MCHC: 26.7 g/dL — ABNORMAL LOW (ref 30.0–36.0)
MCV: 59.7 fL — ABNORMAL LOW (ref 80.0–100.0)
MCV: 59.9 fL — ABNORMAL LOW (ref 80.0–100.0)
MPV: 10.1 fL (ref 7.5–12.5)
Monocytes Absolute: 0.5 10*3/uL (ref 0.1–1.0)
Monocytes Relative: 7.3 %
Monocytes Relative: 9 %
Neutro Abs: 4662 cells/uL (ref 1500–7800)
Neutro Abs: 3.6 10*3/uL (ref 1.7–7.7)
Neutrophils Relative %: 74 %
Neutrophils Relative %: 61 %
Platelets: 469 10*3/uL — ABNORMAL HIGH (ref 140–400)
Platelets: 438 10*3/uL — ABNORMAL HIGH (ref 150–400)
RBC: 4.02 10*6/uL — ABNORMAL LOW (ref 4.20–5.80)
RBC: 3.94 MIL/uL — ABNORMAL LOW (ref 4.22–5.81)
RDW: 17.4 % — ABNORMAL HIGH (ref 11.0–15.0)
RDW: 18.9 % — ABNORMAL HIGH (ref 11.5–15.5)
Total Lymphocyte: 16 %
WBC: 6.3 10*3/uL (ref 3.8–10.8)
WBC: 5.9 10*3/uL (ref 4.0–10.5)
nRBC: 0 % (ref 0.0–0.2)

## 2023-02-08 LAB — RETICULOCYTES
Immature Retic Fract: 18.3 % — ABNORMAL HIGH (ref 2.3–15.9)
RBC.: 3.98 MIL/uL — ABNORMAL LOW (ref 4.22–5.81)
Retic Count, Absolute: 60.9 10*3/uL (ref 19.0–186.0)
Retic Ct Pct: 1.5 % (ref 0.4–3.1)

## 2023-02-08 LAB — IRON AND TIBC
Iron: 10 ug/dL — ABNORMAL LOW (ref 45–182)
Saturation Ratios: 2 % — ABNORMAL LOW (ref 17.9–39.5)
TIBC: 454 ug/dL — ABNORMAL HIGH (ref 250–450)
UIBC: 444 ug/dL

## 2023-02-08 LAB — FOLATE: Folate: 33.2 ng/mL (ref >5.9)

## 2023-02-08 LAB — IRON,TIBC AND FERRITIN PANEL
%SAT: 3 % (calc) — ABNORMAL LOW (ref 20–48)
Ferritin: 5 ng/mL — ABNORMAL LOW (ref 24–380)
Iron: 12 ug/dL — ABNORMAL LOW (ref 50–180)
TIBC: 423 mcg/dL (calc) (ref 250–425)

## 2023-02-08 LAB — FERRITIN: Ferritin: 5 ng/mL — ABNORMAL LOW (ref 24–336)

## 2023-02-08 LAB — PREPARE RBC (CROSSMATCH)

## 2023-02-08 LAB — VITAMIN B12: Vitamin B-12: 675 pg/mL (ref 180–914)

## 2023-02-08 LAB — CBC MORPHOLOGY

## 2023-02-08 MED ORDER — SODIUM CHLORIDE 0.9% IV SOLUTION: Freq: Once | INTRAVENOUS | Status: AC

## 2023-02-08 MED ORDER — ACETAMINOPHEN 325 MG PO TABS: 650.0000 mg | ORAL_TABLET | Freq: Four times a day (QID) | ORAL | Status: DC | PRN

## 2023-02-08 MED ORDER — PANTOPRAZOLE SODIUM 40 MG PO TBEC
40.0000 mg | DELAYED_RELEASE_TABLET | Freq: Every day | ORAL | Status: DC
  Administered 2023-02-09: 40 mg via ORAL
  Filled 2023-02-08: qty 1

## 2023-02-08 MED ORDER — ACETAMINOPHEN 650 MG RE SUPP: 650.0000 mg | Freq: Four times a day (QID) | RECTAL | Status: DC | PRN

## 2023-02-08 MED ORDER — OXYCODONE-ACETAMINOPHEN 5-325 MG PO TABS
1.0000 | ORAL_TABLET | ORAL | Status: DC | PRN
  Filled 2023-02-08: qty 1

## 2023-02-08 MED ORDER — ONDANSETRON HCL 4 MG PO TABS: 4.0000 mg | ORAL_TABLET | Freq: Four times a day (QID) | ORAL | Status: DC | PRN

## 2023-02-08 MED ORDER — DIPHENHYDRAMINE HCL 25 MG PO CAPS: 25.0000 mg | ORAL_CAPSULE | Freq: Four times a day (QID) | ORAL | Status: DC | PRN

## 2023-02-08 MED ORDER — ONDANSETRON HCL 4 MG/2ML IJ SOLN: 4.0000 mg | Freq: Four times a day (QID) | INTRAMUSCULAR | Status: DC | PRN

## 2023-02-08 NOTE — ED Notes (Signed)
ED TO INPATIENT HANDOFF REPORT  ED Nurse Name and Phone #: Eldridge Scot [phone]  S Name/Age/Gender Sharen Hint 72 y.o. male Room/Bed: APA06/APA06  Code Status   Code Status: Full Code  Home/SNF/Other Home Patient oriented to: self, place, time, and situation Is this baseline? Yes   Triage Complete: Triage complete  Chief Complaint Symptomatic anemia [D64.9]  Triage Note Pt reports he was sent here for low hgb, has colonoscopy and EGD scheduled in march.   Allergies Allergies  Allergen Reactions   Penicillins Rash    Level of Care/Admitting Diagnosis ED Disposition     ED Disposition  Admit   Condition  --   [place]: [facility] [id number]  Level of Care: Med-Surg [16]  Covid Evaluation: Asymptomatic - no recent exposure (last 10 days) testing not required  Diagnosis: Symptomatic anemia [id number]  Admitting Physician: Vianne Bulls [id number]  Attending Physician: Vianne Bulls [id number]          B Medical/Surgery History Past Medical History:  Diagnosis Date   Arthritis    GERD (gastroesophageal reflux disease)    Guillain-Barre syndrome ([place])    History of hiatal hernia    Hypertension    Seasonal allergies    Past Surgical History:  Procedure Laterality Date   ANTERIOR CERVICAL DECOMP/DISCECTOMY FUSION N/A 04/23/2013   Procedure: ANTERIOR CERVICAL DECOMPRESSION/DISCECTOMY FUSION 3 LEVELS;  Surgeon: Floyce Stakes, MD;  Location: [facility];  Service: Neurosurgery;  Laterality: N/A;  Cervical three-four, cervical four-five, cervical five-six Anterior cervical decompression/diskectomy/fusion   APPENDECTOMY     CERVICAL FUSION     C7     A IV Location/Drains/Wounds Patient Lines/Drains/Airways Status     Active Line/Drains/Airways     Name Placement date Placement time Site Days   Peripheral IV 02/08/23 20 G Anterior;Distal;Right;Upper Arm 02/08/23  2145  Arm  less than 1   Closed System Drain 1 Anterior Neck  Accordion (Hemovac) 15 Fr. 04/23/13  1755  Neck  3578   Incision 04/23/13 Neck 04/23/13  1510  -- 3578   Incision (Closed) 09/16/20 Back 09/16/20  1654  -- 875            Intake/Output Last 24 hours No intake or output data in the 24 hours ending 02/08/23 2352  Labs/Imaging Results for orders placed or performed during the hospital encounter of 02/08/23 (from the past 48 hour(s))  CBC with Differential     Status: Abnormal   Collection Time: 02/08/23  7:08 PM  Result Value Ref Range   WBC 5.9 4.0 - 10.5 K/uL   RBC 3.94 (L) 4.22 - 5.81 MIL/uL   Hemoglobin 6.3 (LL) 13.0 - 17.0 g/dL    Comment: REPEATED TO VERIFY Reticulocyte Hemoglobin testing may be clinically indicated, consider ordering this additional test [id number] THIS CRITICAL RESULT HAS VERIFIED AND BEEN CALLED TO D STEELE RN BY KIRSTENE FORSYTH ON 02 22 2024 AT 1924, AND HAS BEEN READ BACK.     HCT 23.6 (L) 39.0 - 52.0 %   MCV 59.9 (L) 80.0 - 100.0 fL   MCH 16.0 (L) 26.0 - 34.0 pg   MCHC 26.7 (L) 30.0 - 36.0 g/dL   RDW 18.9 (H) 11.5 - 15.5 %   Platelets 438 (H) 150 - 400 K/uL   nRBC 0.0 0.0 - 0.2 %   Neutrophils Relative % 61 %   Neutro Abs 3.6 1.7 - 7.7 K/uL   Lymphocytes Relative 26 %   Lymphs Abs 1.5  0.7 - 4.0 K/uL   Monocytes Relative 9 %   Monocytes Absolute 0.5 0.1 - 1.0 K/uL   Eosinophils Relative 2 %   Eosinophils Absolute 0.1 0.0 - 0.5 K/uL   Basophils Relative 2 %   Basophils Absolute 0.1 0.0 - 0.1 K/uL   WBC Morphology MORPHOLOGY UNREMARKABLE    Smear Review MORPHOLOGY UNREMARKABLE    Immature Granulocytes 0 %   Abs Immature Granulocytes 0.02 0.00 - 0.07 K/uL   Tear Drop Cells PRESENT    Target Cells PRESENT    Ovalocytes PRESENT     Comment: Performed at [facility], [address] [place], [place] [address]  Comprehensive metabolic panel     Status: Abnormal   Collection Time: 02/08/23  7:08 PM  Result Value Ref Range   Sodium 133 (L) 135 - 145 mmol/L   Potassium 3.9 3.5 - 5.1 mmol/L    Chloride 105 98 - 111 mmol/L   CO2 23 22 - 32 mmol/L   Glucose, Bld 106 (H) 70 - 99 mg/dL    Comment: Glucose reference range applies only to samples taken after fasting for at least 8 hours.   BUN 16 8 - 23 mg/dL   Creatinine, Ser 1.22 0.61 - 1.24 mg/dL   Calcium 8.4 (L) 8.9 - 10.3 mg/dL   Total Protein 6.6 6.5 - 8.1 g/dL   Albumin 3.5 3.5 - 5.0 g/dL   AST 20 15 - 41 U/L   ALT 10 0 - 44 U/L   Alkaline Phosphatase 59 38 - 126 U/L   Total Bilirubin 0.4 0.3 - 1.2 mg/dL   GFR, Estimated >60 >60 mL/min    Comment: (NOTE) Calculated using the CKD-EPI Creatinine Equation (2021)    Anion gap 5 5 - 15    Comment: Performed at [facility], [address], [place], [place] [address]  Reticulocytes     Status: Abnormal   Collection Time: 02/08/23  7:08 PM  Result Value Ref Range   Retic Ct Pct 1.5 0.4 - 3.1 %   RBC. 3.98 (L) 4.22 - 5.81 MIL/uL   Retic Count, Absolute 60.9 19.0 - 186.0 K/uL   Immature Retic Fract 18.3 (H) 2.3 - 15.9 %    Comment: Performed at [facility], [address], [place], [place] [address]  Type and screen [facility]     Status: None (Preliminary result)   Collection Time: 02/08/23  8:39 PM  Result Value Ref Range   ABO/RH(D) O POS    Antibody Screen NEG    Sample Expiration 02/11/2023,2359    Unit Number [id number]    Blood Component Type RBC LR PHER1    Unit division 00    Status of Unit ALLOCATED    Transfusion Status OK TO TRANSFUSE    Crossmatch Result Compatible    Unit Number [id number]    Blood Component Type RBC LR PHER1    Unit division 00    Status of Unit ISSUED    Transfusion Status OK TO TRANSFUSE    Crossmatch Result      Compatible Performed at [facility], [address] [place], [place] [address]   Prepare RBC (crossmatch)     Status: None   Collection Time: 02/08/23  8:39 PM  Result Value Ref Range   Order Confirmation      ORDER PROCESSED BY BLOOD BANK Performed at [facility], [address], [place], [place]  [address]   Iron and TIBC     Status: Abnormal  Collection Time: 02/08/23  9:35 PM  Result Value Ref Range   Iron 10 (L) 45 - 182 ug/dL   TIBC 454 (H) 250 - 450 ug/dL   Saturation Ratios 2 (L) 17.9 - 39.5 %   UIBC 444 ug/dL    Comment: Performed at [facility], [address], [place], [place] [address]  Vitamin B12     Status: None   Collection Time: 02/08/23  9:35 PM  Result Value Ref Range   Vitamin B-12 675 180 - 914 pg/mL    Comment: (NOTE) This assay is not validated for testing neonatal or myeloproliferative syndrome specimens for Vitamin B12 levels. Performed at [facility], [address] [place], [place] [address]   Ferritin     Status: Abnormal   Collection Time: 02/08/23  9:35 PM  Result Value Ref Range   Ferritin 5 (L) 24 - 336 ng/mL    Comment: Performed at [facility], [address], [place], [place] [address]   No results found.  Pending Labs Unresulted Labs (From admission, onward)     Start     Ordered   02/09/23 0500  HIV Antibody (routine testing w rflx)  (HIV Antibody (Routine testing w reflex) panel)  Tomorrow morning,   R        02/08/23 2110   02/09/23 [id number]  Basic metabolic panel  Daily,   R      02/08/23 2110   02/09/23 0500  Magnesium  Tomorrow morning,   R        02/08/23 2110   02/09/23 0500  CBC  Daily,   R      02/08/23 2110   02/08/23 2103  Folate  (Anemia Panel (PNL))  Once,   URGENT        02/08/23 2102            Vitals/Pain Today's Vitals   02/08/23 1832 02/08/23 1833 02/08/23 2137 02/08/23 2205  BP: 123/79  (!) 135/91 (!) 130/90  Pulse: 90  88 92  Resp: 16  16 16  $ Temp: 98 F (36.7 C)  98.2 F (36.8 C) 98.3 F (36.8 C)  TempSrc: Oral  Oral Oral  SpO2: 95%  100% 100%  Weight:  80.3 kg    Height:  5' 3"$  (1.6 m)    PainSc:  0-No pain      Isolation Precautions No active isolations  Medications Medications  0.9 %  sodium chloride infusion (Manually program via Guardrails IV Fluids) (has no administration in time  range)  oxyCODONE-acetaminophen (PERCOCET/ROXICET) 5-325 MG per tablet 1-2 tablet (has no administration in time range)  pantoprazole (PROTONIX) EC tablet 40 mg (has no administration in time range)  diphenhydrAMINE (BENADRYL) capsule 25 mg (has no administration in time range)  acetaminophen (TYLENOL) tablet 650 mg (has no administration in time range)    Or  acetaminophen (TYLENOL) suppository 650 mg (has no administration in time range)  ondansetron (ZOFRAN) tablet 4 mg (has no administration in time range)    Or  ondansetron (ZOFRAN) injection 4 mg (has no administration in time range)    Mobility walks with device     Focused Assessments Cardiac Assessment Handoff:    No results found for: "CKTOTAL", "CKMB", "CKMBINDEX", "TROPONINI" No results found for: "DDIMER" Does the Patient currently have chest pain? No    R Recommendations: See Admitting Provider Note  Report given to:   Additional Notes:  Pt sent to ED via PCP d/t low hgb. Currently receiving 1st unit  of blood, has order for 2. Wife is at bedside and seems to really help with pts anxiety.

## 2023-02-08 NOTE — Telephone Encounter (Signed)
Lab called with critical results on this pt. Please advise

## 2023-02-08 NOTE — Telephone Encounter (Signed)
Dr. Abbey Chatters patient. Spoke with Dena and requested she page or text Dr. Abbey Chatters with critical lab results.

## 2023-02-08 NOTE — ED Triage Notes (Signed)
Pt reports he was sent here for low hgb, has colonoscopy and EGD scheduled in march.

## 2023-02-08 NOTE — H&P (Signed)
History and Physical    Malcome Slaght [id number] DOB: Oct 29, 1951 DOA: 02/08/2023  PCP: Wannetta Sender, FNP   Patient coming from: Home   Chief Complaint: Fatigue, low Hgb   HPI: Lelend Aguino is a pleasant 72 y.o. male with medical history significant for Guillain-Barr, B12 deficiency, arthritis with chronic pain, hiatal hernia, and GERD, now presenting to the emergency department for evaluation of low hemoglobin.  Patient reports insidious development of exertional dyspnea and mild fatigue over the course of months.  He denies any abdominal pain, nausea, vomiting, melena, or hematochezia.  He takes Celebrex daily for arthritis and uses ibuprofen only a couple times per year.  He was referred to GI for evaluation of microcytic anemia, was seen in the clinic today and had blood work ordered, and was later notified that his hemoglobin was 6.4 and that he should proceed to the emergency department.  ED Course: Upon arrival to the ED, patient is found to be afebrile and saturating well on room air with normal heart rate and stable blood pressure.  Blood work is most notable for hemoglobin 6.3, platelets 438,000, MCV 59.9, and sodium 133.  ED physician discussed the case with Dr. Abbey Chatters of GI who sent the patient for transfusion and will see him in the hospital.  Two units of packed red blood cells were ordered for transfusion by the ED physician.  Review of Systems:  All other systems reviewed and apart from HPI, are negative.  Past Medical History:  Diagnosis Date   Arthritis    GERD (gastroesophageal reflux disease)    Guillain-Barre syndrome (HCC)    History of hiatal hernia    Hypertension    Seasonal allergies     Past Surgical History:  Procedure Laterality Date   ANTERIOR CERVICAL DECOMP/DISCECTOMY FUSION N/A 04/23/2013   Procedure: ANTERIOR CERVICAL DECOMPRESSION/DISCECTOMY FUSION 3 LEVELS;  Surgeon: Floyce Stakes, MD;  Location: [facility];  Service:  Neurosurgery;  Laterality: N/A;  Cervical three-four, cervical four-five, cervical five-six Anterior cervical decompression/diskectomy/fusion   APPENDECTOMY     CERVICAL FUSION     C7    Social History:   reports that he has quit smoking. He has never used smokeless tobacco. He reports current alcohol use of about 1.0 standard drink of alcohol per week. He reports that he does not use drugs.  Allergies  Allergen Reactions   Penicillins Rash    History reviewed. No pertinent family history.   Prior to Admission medications   Medication Sig Start Date End Date Taking? Authorizing Provider  calcium carbonate (TUMS EX) 750 MG chewable tablet Chew 1 tablet by mouth 3 (three) times daily as needed (indigestion/heartburn.).   Yes [provider]  celecoxib (CELEBREX) 200 MG capsule Take 200 mg by mouth daily. 07/02/20  Yes [provider]  Cholecalciferol (VITAMIN D3) 125 MCG (5000 UT) TABS Take 10,000 Units by mouth every other day.   Yes [provider]  diphenhydrAMINE (BENADRYL) 25 MG tablet Take 25 mg by mouth every 6 (six) hours as needed for sleep.   Yes [provider]  Glycerin-Hypromellose-PEG 400 (VISINE DRY EYE) 0.2-0.2-1 % SOLN Place 1 drop into both eyes 3 (three) times daily as needed (dry/irritated eyes.).   Yes [provider]  levocetirizine (XYZAL) 5 MG tablet Take 5 mg by mouth every evening.   Yes [provider]  loratadine (CLARITIN) 10 MG tablet Take 10 mg by mouth daily as needed for allergies.   Yes [provider]  Multiple Vitamin (MULTIVITAMIN WITH MINERALS) TABS Take 1 tablet by mouth daily. Centrum   Yes [provider]  Multiple Vitamins-Minerals (EMERGEN-C IMMUNE) PACK Take 1 Package by mouth at bedtime.   Yes [provider]  Omega-3 Fatty Acids (FISH OIL) 1200 MG CAPS Take 1,200 mg by mouth daily.   Yes [provider]  omeprazole (PRILOSEC) 20 MG capsule Take 20 mg by  mouth daily.   Yes [provider]  oxyCODONE-acetaminophen (PERCOCET/ROXICET) 5-325 MG tablet Take 1-2 tablets by mouth every 4 (four) hours as needed for severe pain. 09/18/20  Yes Costella, Vista Mink, PA-C  Oxymetazoline HCl (SINEX LONG-ACTING NA) Place 1 spray into the nose 2 (two) times daily as needed (congestion).   Yes [provider]  Probiotic CAPS Take 1 capsule by mouth daily.   Yes [provider]  zinc gluconate 50 MG tablet Take 50 mg by mouth daily.   Yes [provider]    Physical Exam: Vitals:   02/08/23 1832 02/08/23 1833  BP: 123/79   Pulse: 90   Resp: 16   Temp: 98 F (36.7 C)   TempSrc: Oral   SpO2: 95%   Weight:  80.3 kg  Height:  5' 3"$  (1.6 m)    Constitutional: NAD, calm  Eyes: PERTLA, lids and conjunctivae normal ENMT: Mucous membranes are moist. Posterior pharynx clear of any exudate or lesions.   Neck: supple, no masses  Respiratory: no wheezing, no crackles. No accessory muscle use.  Cardiovascular: S1 & S2 heard, regular rate and rhythm. No extremity edema.   Abdomen: No distension, no tenderness, soft. Bowel sounds active.  Musculoskeletal: no clubbing / cyanosis. No joint deformity upper and lower extremities.   Skin: no significant rashes, lesions, ulcers. Warm, dry, well-perfused. Neurologic: CN 2-12 grossly intact. Moving all extremities. Alert and oriented.  Psychiatric: Pleasant. Cooperative.    Labs and Imaging on Admission: I have personally reviewed following labs and imaging studies  CBC: Recent Labs  Lab 02/07/23 1549 02/08/23 1908  WBC 6.3 5.9  NEUTROABS 4,662 3.6  HGB 6.4* 6.3*  HCT 24.0* 23.6*  MCV 59.7* 59.9*  PLT 469* [id number]*   Basic Metabolic Panel: Recent Labs  Lab 02/08/23 1908  NA 133*  K 3.9  CL 105  CO2 23  GLUCOSE 106*  BUN 16  CREATININE 1.22  CALCIUM 8.4*   GFR: Estimated Creatinine Clearance: 52.8 mL/min (by C-G formula based on SCr of 1.22 mg/dL). Liver Function  Tests: Recent Labs  Lab 02/08/23 1908  AST 20  ALT 10  ALKPHOS 59  BILITOT 0.4  PROT 6.6  ALBUMIN 3.5   No results for input(s): "LIPASE", "AMYLASE" in the last 168 hours. No results for input(s): "AMMONIA" in the last 168 hours. Coagulation Profile: No results for input(s): "INR", "PROTIME" in the last 168 hours. Cardiac Enzymes: No results for input(s): "CKTOTAL", "CKMB", "CKMBINDEX", "TROPONINI" in the last 168 hours. BNP (last 3 results) No results for input(s): "PROBNP" in the last 8760 hours. HbA1C: No results for input(s): "HGBA1C" in the last 72 hours. CBG: No results for input(s): "GLUCAP" in the last 168 hours. Lipid Profile: No results for input(s): "CHOL", "HDL", "LDLCALC", "TRIG", "CHOLHDL", "LDLDIRECT" in the last 72 hours. Thyroid Function Tests: No results for input(s): "TSH", "T4TOTAL", "FREET4", "T3FREE", "THYROIDAB" in the last 72 hours. Anemia Panel: Recent Labs    02/07/23 1549  FERRITIN 5*  TIBC 423  IRON 12*   Urine analysis: No results found for: "COLORURINE", "  APPEARANCEUR", "LABSPEC", "PHURINE", "GLUCOSEU", "HGBUR", "BILIRUBINUR", "KETONESUR", "PROTEINUR", "UROBILINOGEN", "NITRITE", "LEUKOCYTESUR" Sepsis Labs: @LABRCNTIP$ (procalcitonin:4,lacticidven:4) )No results found for this or any previous visit (from the past 240 hour(s)).   Radiological Exams on Admission: No results found.   Assessment/Plan   1. Symptomatic anemia  - Hgb is 6.3, down from 7.9 in September or October 2023 per pt report  - He denies any abdominal pain, N/V, melena, or hematochezia  - 2 units RBC ordered for transfusion; GI planning to see pt in hospital per ED physician  - Clear liquid diet, hold Celebrex, check anemia panel, repeat CBC after transfusion    2. GERD  - Continue PPI    3. Arthritis; chronic pain  - Hold NSAID for now, use Tylenol and Percocet as needed   4. Hyponatremia  - Appears chronic and stable, pt appears euvolemic    DVT prophylaxis:  SCDs  Code Status: Full  Level of Care: Level of care: Med-Surg Family Communication: Wife at bedside   Disposition Plan:  Patient is from: home  Anticipated d/c is to: home  Anticipated d/c date is: Possibly as early as 2/23 or 02/10/23  Patient currently: Pending post-transfusion CBC, GI consultation  Consults called: none  Admission status: Observation     Vianne Bulls, MD [facility]  02/08/2023, 9:11 PM

## 2023-02-08 NOTE — ED Provider Notes (Signed)
[facility] Provider Note   CSN: [id number] Arrival date & time: 02/08/23  1824     History  Chief Complaint  Patient presents with   Abnormal Lab    Bradley Trujillo is a 72 y.o. male.  Patient sent in from Dr. Ave Filter office.  Patient was seen yesterday in his office had blood work done.  And hemoglobin came back significantly low in the 6 range.  He was sent in by Dr. Abbey Chatters for blood transfusion he mentioned IV iron iron studies and that he would see him tomorrow in the office.  He was planning to do colonoscopy March 15 but that could be moved up.  Patient referred to see Dr. Abbey Chatters yesterday for hemoglobin of 7.9 in September patient denies any dark or bloody stools.  Patient's been checked several times never had any blood in his stools.  Says he is having issues with shortness of breath not feeling fatigue no dizziness no chest pain or palpitation he is not taking an iron pill.  In October 2021 his hemoglobin was 10.2 history of B12 deficiency.  No iron studies have been done from what they can tell patient certainly has had no gross melena or hematochezia.  No previous upper endoscopy or colonoscopy.  His brother did die from colon cancer at age 37.  No abdominal pain as well.  Past medical history significant for seasonal allergies arthritis, gastroesophageal reflux disease Gilliam Barr syndrome hypertension history of hiatal hernia.  Past surgical history significant for appendectomy cervical fusion.  It is a former tobacco user.       Home Medications Prior to Admission medications   Medication Sig Start Date End Date Taking? Authorizing Provider  calcium carbonate (TUMS EX) 750 MG chewable tablet Chew 1 tablet by mouth 3 (three) times daily as needed (indigestion/heartburn.).   Yes [provider]  celecoxib (CELEBREX) 200 MG capsule Take 200 mg by mouth daily. 07/02/20  Yes [provider]  Cholecalciferol  (VITAMIN D3) 125 MCG (5000 UT) TABS Take 10,000 Units by mouth every other day.   Yes [provider]  diphenhydrAMINE (BENADRYL) 25 MG tablet Take 25 mg by mouth every 6 (six) hours as needed for sleep.   Yes [provider]  Glycerin-Hypromellose-PEG 400 (VISINE DRY EYE) 0.2-0.2-1 % SOLN Place 1 drop into both eyes 3 (three) times daily as needed (dry/irritated eyes.).   Yes [provider]  levocetirizine (XYZAL) 5 MG tablet Take 5 mg by mouth every evening.   Yes [provider]  loratadine (CLARITIN) 10 MG tablet Take 10 mg by mouth daily as needed for allergies.   Yes [provider]  Multiple Vitamin (MULTIVITAMIN WITH MINERALS) TABS Take 1 tablet by mouth daily. Centrum   Yes [provider]  Multiple Vitamins-Minerals (EMERGEN-C IMMUNE) PACK Take 1 Package by mouth at bedtime.   Yes [provider]  Omega-3 Fatty Acids (FISH OIL) 1200 MG CAPS Take 1,200 mg by mouth daily.   Yes [provider]  omeprazole (PRILOSEC) 20 MG capsule Take 20 mg by mouth daily.   Yes [provider]  oxyCODONE-acetaminophen (PERCOCET/ROXICET) 5-325 MG tablet Take 1-2 tablets by mouth every 4 (four) hours as needed for severe pain. 09/18/20  Yes Costella, Vista Mink, PA-C  Oxymetazoline HCl (SINEX LONG-ACTING NA) Place 1 spray into the nose 2 (two) times daily as needed (congestion).   Yes [provider]  Probiotic CAPS Take 1 capsule by mouth daily.  Yes [provider]  zinc gluconate 50 MG tablet Take 50 mg by mouth daily.   Yes [provider]      Allergies    Penicillins    Review of Systems   Review of Systems  Constitutional:  Positive for fatigue. Negative for chills and fever.  HENT:  Negative for ear pain and sore throat.   Eyes:  Negative for pain and visual disturbance.  Respiratory:  Negative for cough and shortness of breath.   Cardiovascular:  Negative for chest pain and palpitations.   Gastrointestinal:  Negative for abdominal pain, blood in stool and vomiting.  Genitourinary:  Negative for dysuria and hematuria.  Musculoskeletal:  Negative for arthralgias and back pain.  Skin:  Negative for color change and rash.  Neurological:  Negative for dizziness, seizures, syncope and headaches.  All other systems reviewed and are negative.   Physical Exam Updated Vital Signs BP 123/79 (BP Location: Right Arm)   Pulse 90   Temp 98 F (36.7 C) (Oral)   Resp 16   Ht 1.6 m (5' 3"$ )   Wt 80.3 kg   SpO2 95%   BMI 31.35 kg/m  Physical Exam Vitals and nursing note reviewed.  Constitutional:      General: He is not in acute distress.    Appearance: Normal appearance. He is well-developed.  HENT:     Head: Normocephalic and atraumatic.  Eyes:     Extraocular Movements: Extraocular movements intact.     Conjunctiva/sclera: Conjunctivae normal.     Pupils: Pupils are equal, round, and reactive to light.  Cardiovascular:     Rate and Rhythm: Normal rate and regular rhythm.     Heart sounds: No murmur heard. Pulmonary:     Effort: Pulmonary effort is normal. No respiratory distress.     Breath sounds: Normal breath sounds.  Abdominal:     Palpations: Abdomen is soft.     Tenderness: There is no abdominal tenderness.  Musculoskeletal:        General: No swelling.     Cervical back: Normal range of motion and neck supple.     Left lower leg: Left lower leg edema: edcritical.  Skin:    General: Skin is warm and dry.     Capillary Refill: Capillary refill takes less than 2 seconds.     Coloration: Skin is pale.  Neurological:     General: No focal deficit present.     Mental Status: He is alert and oriented to person, place, and time.     Cranial Nerves: No cranial nerve deficit.     Sensory: No sensory deficit.     Motor: Weakness present.  Psychiatric:        Mood and Affect: Mood normal.     ED Results / Procedures / Treatments   Labs (all labs ordered are  listed, but only abnormal results are displayed) Labs Reviewed  CBC WITH DIFFERENTIAL/PLATELET - Abnormal; Notable for the following components:      Result Value   RBC 3.94 (*)    Hemoglobin 6.3 (*)    HCT 23.6 (*)    MCV 59.9 (*)    MCH 16.0 (*)    MCHC 26.7 (*)    RDW 18.9 (*)    Platelets 438 (*)    All other components within normal limits  COMPREHENSIVE METABOLIC PANEL - Abnormal; Notable for the following components:   Sodium 133 (*)    Glucose, Bld 106 (*)  Calcium 8.4 (*)    All other components within normal limits  IRON AND TIBC  TYPE AND SCREEN  PREPARE RBC (CROSSMATCH)    EKG None  Radiology No results found.  Procedures Procedures    Medications Ordered in ED Medications  0.9 %  sodium chloride infusion (Manually program via Guardrails IV Fluids) (has no administration in time range)    ED Course/ Medical Decision Making/ A&P                             Medical Decision Making Amount and/or Complexity of Data Reviewed Labs: ordered.  Risk Prescription drug management. Decision regarding hospitalization.  CRITICAL CARE Performed by: Fredia Sorrow Total critical care time: 45 minutes Critical care time was exclusive of separately billable procedures and treating other patients. Critical care was necessary to treat or prevent imminent or life-threatening deterioration. Critical care was time spent personally by me on the following activities: development of treatment plan with patient and/or surrogate as well as nursing, discussions with consultants, evaluation of patient's response to treatment, examination of patient, obtaining history from patient or surrogate, ordering and performing treatments and interventions, ordering and review of laboratory studies, ordering and review of radiographic studies, pulse oximetry and re-evaluation of patient's condition.  Patient nontoxic no acute distress.  Labs here CBC white count 5.9 hemoglobin 6.3  platelets 438.  MCH is down MCHC is down are DW is down.  Complete metabolic panel sodium [id number] glucose 106 renal function normal GFR greater than 60 LFTs normal bilirubin normal anion gap normal  Ordered iron studies have ordered transfusion of 2 units of blood will contact hospitalist for admission Dr. Abbey Chatters said he would follow patient in the hospital.  Abbey Chatters did mention to him getting IV iron as well.    . Final Clinical Impression(s) / ED Diagnoses Final diagnoses:  Anemia, unspecified type    Rx / DC Orders ED Discharge Orders     None         Fredia Sorrow, MD 02/08/23 2056

## 2023-02-09 ENCOUNTER — Telehealth: Payer: Self-pay | Admitting: Gastroenterology

## 2023-02-09 ENCOUNTER — Other Ambulatory Visit (HOSPITAL_COMMUNITY): Payer: Medicare HMO

## 2023-02-09 ENCOUNTER — Encounter: Payer: Self-pay | Admitting: *Deleted

## 2023-02-09 DIAGNOSIS — D649 Anemia, unspecified: Secondary | ICD-10-CM | POA: Diagnosis not present

## 2023-02-09 DIAGNOSIS — R5382 Chronic fatigue, unspecified: Secondary | ICD-10-CM

## 2023-02-09 DIAGNOSIS — E871 Hypo-osmolality and hyponatremia: Secondary | ICD-10-CM | POA: Diagnosis not present

## 2023-02-09 DIAGNOSIS — D508 Other iron deficiency anemias: Secondary | ICD-10-CM | POA: Diagnosis not present

## 2023-02-09 DIAGNOSIS — R5383 Other fatigue: Secondary | ICD-10-CM | POA: Diagnosis not present

## 2023-02-09 DIAGNOSIS — K219 Gastro-esophageal reflux disease without esophagitis: Secondary | ICD-10-CM | POA: Diagnosis not present

## 2023-02-09 LAB — BASIC METABOLIC PANEL
Anion gap: 7 (ref 5–15)
BUN: 14 mg/dL (ref 8–23)
CO2: 24 mmol/L (ref 22–32)
Calcium: 9 mg/dL (ref 8.9–10.3)
Chloride: 105 mmol/L (ref 98–111)
Creatinine, Ser: 1.1 mg/dL (ref 0.61–1.24)
GFR, Estimated: >60 mL/min (ref >60)
Glucose, Bld: 100 mg/dL — ABNORMAL HIGH (ref 70–99)
Potassium: 4.4 mmol/L (ref 3.5–5.1)
Sodium: 136 mmol/L (ref 135–145)

## 2023-02-09 LAB — MAGNESIUM: Magnesium: 2.1 mg/dL (ref 1.7–2.4)

## 2023-02-09 LAB — CBC
HCT: 29.7 % — ABNORMAL LOW (ref 39.0–52.0)
Hemoglobin: 8.4 g/dL — ABNORMAL LOW (ref 13.0–17.0)
MCH: 18.3 pg — ABNORMAL LOW (ref 26.0–34.0)
MCHC: 28.3 g/dL — ABNORMAL LOW (ref 30.0–36.0)
MCV: 64.7 fL — ABNORMAL LOW (ref 80.0–100.0)
Platelets: 405 10*3/uL — ABNORMAL HIGH (ref 150–400)
RBC: 4.59 MIL/uL (ref 4.22–5.81)
RDW: 25.3 % — ABNORMAL HIGH (ref 11.5–15.5)
WBC: 5.8 10*3/uL (ref 4.0–10.5)
nRBC: 0 % (ref 0.0–0.2)

## 2023-02-09 LAB — HIV ANTIBODY (ROUTINE TESTING W REFLEX): HIV Screen 4th Generation wRfx: NONREACTIVE

## 2023-02-09 MED ORDER — SODIUM CHLORIDE 0.9 % IV SOLN
250.0000 mg | Freq: Once | INTRAVENOUS | Status: AC
  Administered 2023-02-09: 250 mg via INTRAVENOUS
  Filled 2023-02-09: qty 20
  Filled 2023-02-09: qty 250

## 2023-02-09 NOTE — Discharge Instructions (Addendum)
Follow up with Dr. Abbey Chatters in one week

## 2023-02-09 NOTE — ED Notes (Signed)
Pharmacy aware that iron infusion is needed.

## 2023-02-09 NOTE — Discharge Summary (Addendum)
Physician Discharge Summary   Patient: Bradley Trujillo MRN: [id number] DOB: [birth year]  Admit date:     02/08/2023  Discharge date: 02/09/23  Discharge Physician: Shanon Brow Alecea Trego   PCP: Adaline Sill, NP   Recommendations at discharge:   Please follow up with primary care provider within 1-2 weeks  Please repeat BMP and CBC in one week    Hospital Course: 72 y.o. male with medical history significant for Guillain-Barr, B12 deficiency, arthritis with chronic pain, hiatal hernia, and GERD, now presenting for evaluation of low hemoglobin.  The patient was sent to see Dr. Abbey Chatters on 02/07/2023 for evaluation of his microcytic anemia.  Routine CBC was done and showed the patient to have a hemoglobin of 6.4.  He was subsequently directed to the emergency department for further evaluation and treatment.  The patient frankly denies any hemoptysis, hematemesis, hematuria, hematochezia, melena.  He has been taking Celebrex on a daily basis.  He takes ibuprofen occasionally, few times per month.  He has complained of some fatigue and some dyspnea on exertion but denies any chest pain or syncope or dizziness.  In the ED, the patient was afebrile and hemodynamically stable.  CBC showed a white blood cell count of 5.9, hemoglobin 6.3, platelets 430,000.  BMP initially showed sodium 133, potassium 2.9, bicarbonate 23, serum creatinine 1.22.  The patient was transfused 2 units PRBC.  Iron studies showed iron saturation of 2%, ferritin 5.  The patient was transfused IV iron.  He was symptomatically improved. The case was discussed with GI, Dr. Eulas Post on the morning of 02/09/2023.  Repeat CBC showed hemoglobin up to 8.4.  The patient remained hemodynamically stable.  GI cleared the patient for discharge for outpatient EGD and colonoscopy.  Assessment and Plan: 1. Symptomatic anemia  - Hgb is 6.3, down from 7.9 in September or October 2023 per pt report  - He denies any abdominal pain, N/V, melena, or hematochezia   - 2 units RBC ordered for transfusion; GI planning to see pt in hospital per ED physician  - Clear liquid diet, hold Celebrex, check anemia panel, repeat CBC after transfusion  -2/23--spoke with Dr. Rosemary Holms to d/c home after IV iron - Hgb 8.4 on day of d/c   2. GERD  - Continue PPI     3. Arthritis; chronic pain  - Hold NSAID for now, use Tylenol and Percocet as needed    4. Hyponatremia  - Appears chronic and stable, pt appears euvolemic  - Na 136 on day of d/c      Consultants: GI Procedures performed: none  Disposition: Home Diet recommendation:  Cardiac diet DISCHARGE MEDICATION: Allergies as of 02/09/2023       Reactions   Penicillins Rash        Medication List     STOP taking these medications    celecoxib 200 MG capsule Commonly known as: CELEBREX       TAKE these medications    calcium carbonate 750 MG chewable tablet Commonly known as: TUMS EX Chew 1 tablet by mouth 3 (three) times daily as needed (indigestion/heartburn.).   diphenhydrAMINE 25 MG tablet Commonly known as: BENADRYL Take 25 mg by mouth every 6 (six) hours as needed for sleep.   Emergen-C Immune Pack Take 1 Package by mouth at bedtime.   Fish Oil 1200 MG Caps Take 1,200 mg by mouth daily.   levocetirizine 5 MG tablet Commonly known as: XYZAL Take 5 mg by mouth every evening.   loratadine 10 MG  tablet Commonly known as: CLARITIN Take 10 mg by mouth daily as needed for allergies.   multivitamin with minerals Tabs tablet Take 1 tablet by mouth daily. Centrum   omeprazole 20 MG capsule Commonly known as: PRILOSEC Take 20 mg by mouth daily.   oxyCODONE-acetaminophen 5-325 MG tablet Commonly known as: PERCOCET/ROXICET Take 1-2 tablets by mouth every 4 (four) hours as needed for severe pain.   Probiotic Caps Take 1 capsule by mouth daily.   SINEX LONG-ACTING NA Place 1 spray into the nose 2 (two) times daily as needed (congestion).   Visine Dry Eye 0.2-0.2-1 %  Soln Generic drug: Glycerin-Hypromellose-PEG 400 Place 1 drop into both eyes 3 (three) times daily as needed (dry/irritated eyes.).   Vitamin D3 125 MCG (5000 UT) Tabs Generic drug: Cholecalciferol Take 10,000 Units by mouth every other day.   zinc gluconate 50 MG tablet Take 50 mg by mouth daily.        Follow-up Information     Eloise Harman, DO Follow up in 1 week(s).   Specialty: Gastroenterology Contact information: [place] [address] [phone]                Discharge Exam: Danley Danker Weights   02/08/23 1833  Weight: 80.3 kg   HEENT:  [place]/AT, No thrush, no icterus CV:  RRR, no rub, no S3, no S4 Lung:  CTA, no wheeze, no rhonchi Abd:  soft/+BS, NT Ext:  No edema, no lymphangitis, no synovitis, no rash   Condition at discharge: stable  The results of significant diagnostics from this hospitalization (including imaging, microbiology, ancillary and laboratory) are listed below for reference.   Imaging Studies: No results found.  Microbiology: Results for orders placed or performed during the hospital encounter of 09/14/20  SARS CORONAVIRUS 2 (Javoni Lucken 6-24 HRS) Nasopharyngeal Nasopharyngeal Swab     Status: None   Collection Time: 09/14/20  1:47 PM   Specimen: Nasopharyngeal Swab  Result Value Ref Range Status   SARS Coronavirus 2 NEGATIVE NEGATIVE Final    Comment: (NOTE) SARS-CoV-2 target nucleic acids are NOT DETECTED.  The SARS-CoV-2 RNA is generally detectable in upper and lower respiratory specimens during the acute phase of infection. Negative results do not preclude SARS-CoV-2 infection, do not rule out co-infections with other pathogens, and should not be used as the sole basis for treatment or other patient management decisions. Negative results must be combined with clinical observations, patient history, and epidemiological information. The expected result is Negative.  Fact Sheet for  Patients: SugarRoll.be  Fact Sheet for Healthcare Providers: https://www.woods-mathews.com/  This test is not yet approved or cleared by the [place] FDA and  has been authorized for detection and/or diagnosis of SARS-CoV-2 by FDA under an Emergency Use Authorization (EUA). This EUA will remain  in effect (meaning this test can be used) for the duration of the COVID-19 declaration under Se ction 564(b)(1) of the Act, 21 U.S.C. section 360bbb-3(b)(1), unless the authorization is terminated or revoked sooner.  Performed at [facility], [place] [address] [place], [place] [address]     Labs: CBC: Recent Labs  Lab 02/07/23 1549 02/08/23 1908 02/09/23 0620  WBC 6.3 5.9 5.8  NEUTROABS 4,662 3.6  --   HGB 6.4* 6.3* 8.4*  HCT 24.0* 23.6* 29.7*  MCV 59.7* 59.9* 64.7*  PLT 469* 438* [id number]*   Basic Metabolic Panel: Recent Labs  Lab 02/08/23 1908 02/09/23 0620  NA 133* 136  K 3.9 4.4  CL 105  105  CO2 23 24  GLUCOSE 106* 100*  BUN 16 14  CREATININE 1.22 1.10  CALCIUM 8.4* 9.0  MG  --  2.1   Liver Function Tests: Recent Labs  Lab 02/08/23 1908  AST 20  ALT 10  ALKPHOS 59  BILITOT 0.4  PROT 6.6  ALBUMIN 3.5   CBG: No results for input(s): "GLUCAP" in the last 168 hours.  Discharge time spent: greater than 30 minutes.  Signed: Orson Eva, MD [facility] 02/09/2023

## 2023-02-09 NOTE — Consult Note (Signed)
Consulting  Provider: Dr. Carles Collet Primary Care Physician:  Wannetta Sender, FNP Primary Gastroenterologist:  Dr. Abbey Chatters  Reason for Consultation: Iron deficiency anemia  HPI:  Bradley Trujillo is a 72 y.o. male with a past medical history of Ethelene Hal, B12 deficiency, arthritis, chronic pain, hiatal hernia, chronic GERD, who presented to [facility], ER due to low hemoglobin.  Previously seen patient in clinic 2 days ago for iron deficiency anemia.  Recheck CBC 02/08/2023 which showed worsening hemoglobin of 6.4.  In the ER repeat blood work confirm hemoglobin 6.3.  Received 2 units of PRBCs and is currently 8.4.  Iron studies also low iron level 12, ferritin 5.  Patient denies any overt GI bleeding.  No melena hematochezia.  No previous EGD or colonoscopy.  Does note his brother died from colon cancer at age 9.   Chronic reflux well-controlled omeprazole 20 mg daily.  Symptoms mild, intermittent.  Chronically takes Celebrex for knee pain.  No other NSAID use.   Denies any abdominal pain.  No unintentional weight loss.   Does note some chronic fatigue.  States that he cannot be active for too long before he gets tired easily.  Past Medical History:  Diagnosis Date   Arthritis    GERD (gastroesophageal reflux disease)    Guillain-Barre syndrome (HCC)    History of hiatal hernia    Hypertension    Seasonal allergies     Past Surgical History:  Procedure Laterality Date   ANTERIOR CERVICAL DECOMP/DISCECTOMY FUSION N/A 04/23/2013   Procedure: ANTERIOR CERVICAL DECOMPRESSION/DISCECTOMY FUSION 3 LEVELS;  Surgeon: Floyce Stakes, MD;  Location: [facility];  Service: Neurosurgery;  Laterality: N/A;  Cervical three-four, cervical four-five, cervical five-six Anterior cervical decompression/diskectomy/fusion   APPENDECTOMY     CERVICAL FUSION     C7    Prior to Admission medications   Medication Sig Start Date End Date Taking? Authorizing Provider  calcium carbonate (TUMS EX)  750 MG chewable tablet Chew 1 tablet by mouth 3 (three) times daily as needed (indigestion/heartburn.).   Yes [provider]  celecoxib (CELEBREX) 200 MG capsule Take 200 mg by mouth daily. 07/02/20  Yes [provider]  Cholecalciferol (VITAMIN D3) 125 MCG (5000 UT) TABS Take 10,000 Units by mouth every other day.   Yes [provider]  diphenhydrAMINE (BENADRYL) 25 MG tablet Take 25 mg by mouth every 6 (six) hours as needed for sleep.   Yes [provider]  Glycerin-Hypromellose-PEG 400 (VISINE DRY EYE) 0.2-0.2-1 % SOLN Place 1 drop into both eyes 3 (three) times daily as needed (dry/irritated eyes.).   Yes [provider]  levocetirizine (XYZAL) 5 MG tablet Take 5 mg by mouth every evening.   Yes [provider]  loratadine (CLARITIN) 10 MG tablet Take 10 mg by mouth daily as needed for allergies.   Yes [provider]  Multiple Vitamin (MULTIVITAMIN WITH MINERALS) TABS Take 1 tablet by mouth daily. Centrum   Yes [provider]  Multiple Vitamins-Minerals (EMERGEN-C IMMUNE) PACK Take 1 Package by mouth at bedtime.   Yes [provider]  Omega-3 Fatty Acids (FISH OIL) 1200 MG CAPS Take 1,200 mg by mouth daily.   Yes [provider]  omeprazole (PRILOSEC) 20 MG capsule Take 20 mg by mouth daily.   Yes [provider]  oxyCODONE-acetaminophen (PERCOCET/ROXICET) 5-325 MG tablet Take 1-2 tablets by mouth every 4 (four) hours as needed for severe pain. 09/18/20  Yes Costella, Vista Mink, PA-C  Oxymetazoline  HCl (SINEX LONG-ACTING NA) Place 1 spray into the nose 2 (two) times daily as needed (congestion).   Yes [provider]  Probiotic CAPS Take 1 capsule by mouth daily.   Yes [provider]  zinc gluconate 50 MG tablet Take 50 mg by mouth daily.   Yes [provider]    Current Facility-Administered Medications  Medication Dose Route Frequency Provider Last Rate Last Admin    acetaminophen (TYLENOL) tablet 650 mg  650 mg Oral Q6H PRN Opyd, Ilene Qua, MD       Or   acetaminophen (TYLENOL) suppository 650 mg  650 mg Rectal Q6H PRN Opyd, Ilene Qua, MD       diphenhydrAMINE (BENADRYL) capsule 25 mg  25 mg Oral Q6H PRN Opyd, Ilene Qua, MD       ferric gluconate (FERRLECIT) 250 mg in sodium chloride 0.9 % 250 mL IVPB  250 mg Intravenous Once Tat, Shanon Brow, MD       ondansetron [facility]) tablet 4 mg  4 mg Oral Q6H PRN Opyd, Ilene Qua, MD       Or   ondansetron (ZOFRAN) injection 4 mg  4 mg Intravenous Q6H PRN Opyd, Ilene Qua, MD       oxyCODONE-acetaminophen (PERCOCET/ROXICET) 5-325 MG per tablet 1-2 tablet  1-2 tablet Oral Q4H PRN Opyd, Ilene Qua, MD       pantoprazole (PROTONIX) EC tablet 40 mg  40 mg Oral Daily Opyd, Ilene Qua, MD       Current Outpatient Medications  Medication Sig Dispense Refill   calcium carbonate (TUMS EX) 750 MG chewable tablet Chew 1 tablet by mouth 3 (three) times daily as needed (indigestion/heartburn.).     celecoxib (CELEBREX) 200 MG capsule Take 200 mg by mouth daily.     Cholecalciferol (VITAMIN D3) 125 MCG (5000 UT) TABS Take 10,000 Units by mouth every other day.     diphenhydrAMINE (BENADRYL) 25 MG tablet Take 25 mg by mouth every 6 (six) hours as needed for sleep.     Glycerin-Hypromellose-PEG 400 (VISINE DRY EYE) 0.2-0.2-1 % SOLN Place 1 drop into both eyes 3 (three) times daily as needed (dry/irritated eyes.).     levocetirizine (XYZAL) 5 MG tablet Take 5 mg by mouth every evening.     loratadine (CLARITIN) 10 MG tablet Take 10 mg by mouth daily as needed for allergies.     Multiple Vitamin (MULTIVITAMIN WITH MINERALS) TABS Take 1 tablet by mouth daily. Centrum     Multiple Vitamins-Minerals (EMERGEN-C IMMUNE) PACK Take 1 Package by mouth at bedtime.     Omega-3 Fatty Acids (FISH OIL) 1200 MG CAPS Take 1,200 mg by mouth daily.     omeprazole (PRILOSEC) 20 MG capsule Take 20 mg by mouth daily.     oxyCODONE-acetaminophen  (PERCOCET/ROXICET) 5-325 MG tablet Take 1-2 tablets by mouth every 4 (four) hours as needed for severe pain. 60 tablet 0   Oxymetazoline HCl (SINEX LONG-ACTING NA) Place 1 spray into the nose 2 (two) times daily as needed (congestion).     Probiotic CAPS Take 1 capsule by mouth daily.     zinc gluconate 50 MG tablet Take 50 mg by mouth daily.      Allergies as of 02/08/2023 - Review Complete 02/08/2023  Allergen Reaction Noted   Penicillins Rash 04/17/2013    History reviewed. No pertinent family history.  Social History   Socioeconomic History   Marital status: Married    Spouse name: Not on file   Number  of children: Not on file   Years of education: Not on file   Highest education level: Not on file  Occupational History   Not on file  Tobacco Use   Smoking status: Former   Smokeless tobacco: Never  Vaping Use   Vaping Use: Former  Substance and Sexual Activity   Alcohol use: Yes    Alcohol/week: 1.0 standard drink of alcohol    Types: 1 Cans of beer per week    Comment: socially   Drug use: No   Sexual activity: Not on file  Other Topics Concern   Not on file  Social History Narrative   Not on file   Social Determinants of Health   Financial Resource Strain: Not on file  Food Insecurity: Not on file  Transportation Needs: Not on file  Physical Activity: Not on file  Stress: Not on file  Social Connections: Not on file  Intimate Partner Violence: Not on file    Review of Systems: General: Negative for anorexia, weight loss, fever, chills, fatigue, weakness. Eyes: Negative for vision changes.  ENT: Negative for hoarseness, difficulty swallowing , nasal congestion. CV: Negative for chest pain, angina, palpitations, dyspnea on exertion, peripheral edema.  Respiratory: Negative for dyspnea at rest, dyspnea on exertion, cough, sputum, wheezing.  GI: See history of present illness. GU:  Negative for dysuria, hematuria, urinary incontinence, urinary frequency,  nocturnal urination.  MS: Negative for joint pain, low back pain.  Derm: Negative for rash or itching.  Neuro: Negative for weakness, abnormal sensation, seizure, frequent headaches, memory loss, confusion.  Psych: Negative for anxiety, depression Endo: Negative for unusual weight change.  Heme: Negative for bruising or bleeding. Allergy: Negative for rash or hives.  Physical Exam: Vital signs in last 24 hours: Temp:  [97.6 F (36.4 C)-98.3 F (36.8 C)] 98.2 F (36.8 C) (02/23 0904) Pulse Rate:  [72-102] 72 (02/23 0904) Resp:  [13-22] 19 (02/23 0904) BP: (99-135)/(72-92) 113/79 (02/23 0904) SpO2:  [94 %-100 %] 99 % (02/23 0904) Weight:  [80.3 kg] 80.3 kg (02/22 1833) Last BM Date : 02/08/23 General:   Alert,  Well-developed, well-nourished, pleasant and cooperative in NAD Head:  Normocephalic and atraumatic. Eyes:  Sclera clear, no icterus.   Conjunctiva pink. Ears:  Normal auditory acuity. Nose:  No deformity, discharge,  or lesions. Mouth:  No deformity or lesions, dentition normal. Neck:  Supple; no masses or thyromegaly. Lungs:  Clear throughout to auscultation.   No wheezes, crackles, or rhonchi. No acute distress. Heart:  Regular rate and rhythm; no murmurs, clicks, rubs,  or gallops. Abdomen:  Soft, nontender and nondistended. No masses, hepatosplenomegaly or hernias noted. Normal bowel sounds, without guarding, and without rebound.   Msk:  Symmetrical without gross deformities. Normal posture. Pulses:  Normal pulses noted. Extremities:  Without clubbing or edema. Neurologic:  Alert and  oriented x4;  grossly normal neurologically. Skin:  Intact without significant lesions or rashes. Cervical Nodes:  No significant cervical adenopathy. Psych:  Alert and cooperative. Normal mood and affect.  Intake/Output from previous day: 02/22 0701 - 02/23 0700 In: 655 [I.V.:30; Blood:625] Out: -  Intake/Output this shift: No intake/output data recorded.  Lab Results: Recent  Labs    02/07/23 1549 02/08/23 1908 02/09/23 0620  WBC 6.3 5.9 5.8  HGB 6.4* 6.3* 8.4*  HCT 24.0* 23.6* 29.7*  PLT 469* 438* 405*   BMET Recent Labs    02/08/23 1908 02/09/23 0620  NA 133* 136  K 3.9 4.4  CL 105  105  CO2 23 24  GLUCOSE 106* 100*  BUN 16 14  CREATININE 1.22 1.10  CALCIUM 8.4* 9.0   LFT Recent Labs    02/08/23 1908  PROT 6.6  ALBUMIN 3.5  AST 20  ALT 10  ALKPHOS 59  BILITOT 0.4   PT/INR No results for input(s): "LABPROT", "INR" in the last 72 hours. Hepatitis Panel No results for input(s): "HEPBSAG", "HCVAB", "HEPAIGM", "HEPBIGM" in the last 72 hours. C-Diff No results for input(s): "CDIFFTOX" in the last 72 hours.  Studies/Results: No results found.  Assessment: *Iron deficiency anemia-worsening *Chronic GERD-well-controlled omeprazole daily *Family history of colon cancer in brother-died at 60 *Generalized weakness/fatigue  Plan: Patient's hemoglobin improved status post 2 units PRBCs.  Agree with IV iron.  Will need oral supplementation going forward.  We will expedite patient's EGD and colonoscopy to 02/12/2023.  Patient told to not start his oral iron supplementation until after procedures.  May benefit from hematology referral.  Continue on PPI daily.  Okay to DC from GI standpoint.  Elon Alas. Abbey Chatters, D.O. Gastroenterology and Hepatology [facility]    LOS: 0 days     02/09/2023, 9:59 AM

## 2023-02-09 NOTE — Telephone Encounter (Signed)
Patient seen in the ED for low hemoglobin of 6.3.  He has received 2 units of blood and will receive a dose of IV iron.    Patient will discharge home and plan for outpatient EGD and colonoscopy Monday.  He is aware of plan and agreeable to procedures on Monday after speaking with Dr. Abbey Chatters in the ED.  Please contact patient with time and notify preop to call him.  Please follow previous medication or preop instructions that were given for his originally planned procedure 03/02/2023.  Venetia Night, MSN, APRN, FNP-BC, AGACNP-BC [facility] at [facility]

## 2023-02-09 NOTE — Hospital Course (Signed)
72 y.o. male with medical history significant for Guillain-Barr, B12 deficiency, arthritis with chronic pain, hiatal hernia, and GERD, now presenting for evaluation of low hemoglobin.  The patient was sent to see Dr. Abbey Chatters on 02/07/2023 for evaluation of his microcytic anemia.  Routine CBC was done and showed the patient to have a hemoglobin of 6.4.  He was subsequently directed to the emergency department for further evaluation and treatment.  The patient frankly denies any hemoptysis, hematemesis, hematuria, hematochezia, melena.  He has been taking Celebrex on a daily basis.  He takes ibuprofen occasionally, few times per month.  He has complained of some fatigue and some dyspnea on exertion but denies any chest pain or syncope or dizziness.  In the ED, the patient was afebrile and hemodynamically stable.  CBC showed a white blood cell count of 5.9, hemoglobin 6.3, platelets 430,000.  BMP initially showed sodium 133, potassium 2.9, bicarbonate 23, serum creatinine 1.22.  The patient was transfused 2 units PRBC.  Iron studies showed iron saturation of 2%, ferritin 5.  The patient was transfused IV iron.  He was symptomatically improved. The case was discussed with GI, Dr. Eulas Post on the morning of 02/09/2023.  Repeat CBC showed hemoglobin up to 8.4.  The patient remained hemodynamically stable.  GI cleared the patient for discharge for outpatient EGD and colonoscopy.

## 2023-02-09 NOTE — ED Notes (Signed)
Patient states pain was not enough to use PRN medications at this point. Patient verbalizes understanding on use of pain scale.

## 2023-02-09 NOTE — Telephone Encounter (Signed)
PA for procedures updated with corrected dates via cohere,  Per [employer] "This [employer] plan does not currently require a prior authorization for these services. If you have general questions about the prior authorization requirements, please call us at [phone] or visit [email or web address] > Clinician Resources > Advance and Admission Notification Requirements. The number above acknowledges your notification. Please write this number down for future reference. Notification is not a guarantee of coverage or payment. Decision ID #: [id number]"

## 2023-02-09 NOTE — Telephone Encounter (Signed)
Texted Dr Abbey Chatters and relayed information regarding the pt yesterday. Dr Abbey Chatters got with the pt

## 2023-02-09 NOTE — Progress Notes (Signed)
  Transition of Care [facility]) Screening Note   Patient Details  Name: Bradley Trujillo Date of Birth: [birth year]   Transition of Care [facility]) CM/SW Contact:    Ihor Gully, LCSW Phone Number: 02/09/2023, 1:15 PM    Transition of Care Department [facility]) has reviewed patient and no TOC needs have been identified at this time. We will continue to monitor patient advancement through interdisciplinary progression rounds. If new patient transition needs arise, please place a TOC consult.

## 2023-02-09 NOTE — Telephone Encounter (Signed)
Spoke with spouse. Aware scheduled for Monday. I am going to take clenpiq sample/instructions down to [facility] ED for pt as they have not been able to get prep yet from pharmacy. Endo aware. Patient will have pre-op phone call.

## 2023-02-12 ENCOUNTER — Ambulatory Visit (HOSPITAL_COMMUNITY)
Admission: RE | Admit: 2023-02-12 | Discharge: 2023-02-12 | Disposition: A | Discharge status: Home / Self Care | Payer: 59 | Attending: Internal Medicine | Admitting: Internal Medicine

## 2023-02-12 ENCOUNTER — Encounter (HOSPITAL_COMMUNITY): Payer: Self-pay

## 2023-02-12 ENCOUNTER — Other Ambulatory Visit: Payer: Self-pay

## 2023-02-12 ENCOUNTER — Telehealth: Payer: Self-pay | Admitting: Internal Medicine

## 2023-02-12 ENCOUNTER — Encounter (HOSPITAL_COMMUNITY)
Admission: RE | Disposition: A | Discharge status: Home / Self Care | Payer: Self-pay | Source: Home / Self Care | Attending: Internal Medicine

## 2023-02-12 ENCOUNTER — Ambulatory Visit (HOSPITAL_COMMUNITY): Payer: 59 | Admitting: Certified Registered Nurse Anesthetist

## 2023-02-12 ENCOUNTER — Ambulatory Visit (HOSPITAL_BASED_OUTPATIENT_CLINIC_OR_DEPARTMENT_OTHER): Payer: 59 | Admitting: Certified Registered Nurse Anesthetist

## 2023-02-12 DIAGNOSIS — D125 Benign neoplasm of sigmoid colon: Secondary | ICD-10-CM

## 2023-02-12 DIAGNOSIS — Z79899 Other long term (current) drug therapy: Secondary | ICD-10-CM | POA: Diagnosis not present

## 2023-02-12 DIAGNOSIS — K5730 Diverticulosis of large intestine without perforation or abscess without bleeding: Secondary | ICD-10-CM | POA: Insufficient documentation

## 2023-02-12 DIAGNOSIS — M1990 Unspecified osteoarthritis, unspecified site: Secondary | ICD-10-CM | POA: Insufficient documentation

## 2023-02-12 DIAGNOSIS — I10 Essential (primary) hypertension: Secondary | ICD-10-CM | POA: Diagnosis not present

## 2023-02-12 DIAGNOSIS — K219 Gastro-esophageal reflux disease without esophagitis: Secondary | ICD-10-CM | POA: Diagnosis not present

## 2023-02-12 DIAGNOSIS — K635 Polyp of colon: Secondary | ICD-10-CM | POA: Diagnosis not present

## 2023-02-12 DIAGNOSIS — R5383 Other fatigue: Secondary | ICD-10-CM | POA: Insufficient documentation

## 2023-02-12 DIAGNOSIS — D500 Iron deficiency anemia secondary to blood loss (chronic): Secondary | ICD-10-CM | POA: Diagnosis not present

## 2023-02-12 DIAGNOSIS — Z87891 Personal history of nicotine dependence: Secondary | ICD-10-CM | POA: Insufficient documentation

## 2023-02-12 DIAGNOSIS — K259 Gastric ulcer, unspecified as acute or chronic, without hemorrhage or perforation: Secondary | ICD-10-CM | POA: Insufficient documentation

## 2023-02-12 DIAGNOSIS — K449 Diaphragmatic hernia without obstruction or gangrene: Secondary | ICD-10-CM | POA: Insufficient documentation

## 2023-02-12 DIAGNOSIS — M25569 Pain in unspecified knee: Secondary | ICD-10-CM | POA: Diagnosis not present

## 2023-02-12 DIAGNOSIS — Z800 Family history of malignant neoplasm of digestive organs: Secondary | ICD-10-CM

## 2023-02-12 DIAGNOSIS — C180 Malignant neoplasm of cecum: Secondary | ICD-10-CM | POA: Diagnosis not present

## 2023-02-12 DIAGNOSIS — Z791 Long term (current) use of non-steroidal anti-inflammatories (NSAID): Secondary | ICD-10-CM | POA: Insufficient documentation

## 2023-02-12 DIAGNOSIS — K6389 Other specified diseases of intestine: Secondary | ICD-10-CM

## 2023-02-12 DIAGNOSIS — D649 Anemia, unspecified: Secondary | ICD-10-CM

## 2023-02-12 HISTORY — PX: COLONOSCOPY WITH PROPOFOL: SHX5780

## 2023-02-12 HISTORY — PX: ESOPHAGOGASTRODUODENOSCOPY (EGD) WITH PROPOFOL: SHX5813

## 2023-02-12 HISTORY — PX: POLYPECTOMY: SHX149

## 2023-02-12 HISTORY — PX: BIOPSY: SHX5522

## 2023-02-12 SURGERY — COLONOSCOPY WITH PROPOFOL
Anesthesia: General

## 2023-02-12 MED ORDER — LIDOCAINE HCL (CARDIAC) PF 50 MG/5ML IV SOSY
PREFILLED_SYRINGE | INTRAVENOUS | Status: DC | PRN
  Administered 2023-02-12: 100 mg via INTRAVENOUS

## 2023-02-12 MED ORDER — PROPOFOL 500 MG/50ML IV EMUL
INTRAVENOUS | Status: DC | PRN
  Administered 2023-02-12: 100 ug/kg/min via INTRAVENOUS

## 2023-02-12 MED ORDER — LACTATED RINGERS IV SOLN: INTRAVENOUS | Status: DC

## 2023-02-12 MED ORDER — PROPOFOL 10 MG/ML IV BOLUS
INTRAVENOUS | Status: DC | PRN
  Administered 2023-02-12: 20 mg via INTRAVENOUS
  Administered 2023-02-12: 50 mg via INTRAVENOUS
  Administered 2023-02-12: 100 mg via INTRAVENOUS

## 2023-02-12 NOTE — Discharge Instructions (Addendum)
EGD Discharge instructions Please read the instructions outlined below and refer to this sheet in the next few weeks. These discharge instructions provide you with general information on caring for yourself after you leave the hospital. Your doctor may also give you specific instructions. While your treatment has been planned according to the most current medical practices available, unavoidable complications occasionally occur. If you have any problems or questions after discharge, please call your doctor. ACTIVITY You may resume your regular activity but move at a slower pace for the next 24 hours.  Take frequent rest periods for the next 24 hours.  Walking will help expel (get rid of) the air and reduce the bloated feeling in your abdomen.  No driving for 24 hours (because of the anesthesia (medicine) used during the test).  You may shower.  Do not sign any important legal documents or operate any machinery for 24 hours (because of the anesthesia used during the test).  NUTRITION Drink plenty of fluids.  You may resume your normal diet.  Begin with a light meal and progress to your normal diet.  Avoid alcoholic beverages for 24 hours or as instructed by your caregiver.  MEDICATIONS You may resume your normal medications unless your caregiver tells you otherwise.  WHAT YOU CAN EXPECT TODAY You may experience abdominal discomfort such as a feeling of fullness or "gas" pains.  FOLLOW-UP Your doctor will discuss the results of your test with you.  SEEK IMMEDIATE MEDICAL ATTENTION IF ANY OF THE FOLLOWING OCCUR: Excessive nausea (feeling sick to your stomach) and/or vomiting.  Severe abdominal pain and distention (swelling).  Trouble swallowing.  Temperature over 101 F (37.8 C).  Rectal bleeding or vomiting of blood.     Colonoscopy Discharge Instructions  Read the instructions outlined below and refer to this sheet in the next few weeks. These discharge instructions provide you with  general information on caring for yourself after you leave the hospital. Your doctor may also give you specific instructions. While your treatment has been planned according to the most current medical practices available, unavoidable complications occasionally occur.   ACTIVITY You may resume your regular activity, but move at a slower pace for the next 24 hours.  Take frequent rest periods for the next 24 hours.  Walking will help get rid of the air and reduce the bloated feeling in your belly (abdomen).  No driving for 24 hours (because of the medicine (anesthesia) used during the test).   Do not sign any important legal documents or operate any machinery for 24 hours (because of the anesthesia used during the test).  NUTRITION Drink plenty of fluids.  You may resume your normal diet as instructed by your doctor.  Begin with a light meal and progress to your normal diet. Heavy or fried foods are harder to digest and may make you feel sick to your stomach (nauseated).  Avoid alcoholic beverages for 24 hours or as instructed.  MEDICATIONS You may resume your normal medications unless your doctor tells you otherwise.  WHAT YOU CAN EXPECT TODAY Some feelings of bloating in the abdomen.  Passage of more gas than usual.  Spotting of blood in your stool or on the toilet paper.  IF YOU HAD POLYPS REMOVED DURING THE COLONOSCOPY: No aspirin products for 7 days or as instructed.  No alcohol for 7 days or as instructed.  Eat a soft diet for the next 24 hours.  FINDING OUT THE RESULTS OF YOUR TEST Not all test results are  available during your visit. If your test results are not back during the visit, make an appointment with your caregiver to find out the results. Do not assume everything is normal if you have not heard from your caregiver or the medical facility. It is important for you to follow up on all of your test results.  SEEK IMMEDIATE MEDICAL ATTENTION IF: You have more than a spotting of  blood in your stool.  Your belly is swollen (abdominal distention).  You are nauseated or vomiting.  You have a temperature over 101.  You have abdominal pain or discomfort that is severe or gets worse throughout the day.   Your upper endoscopy revealed a medium size hiatal hernia with a few Cameron erosions.  Otherwise unremarkable.  Esophagus and small bowel appeared normal.  Continue omeprazole daily.  Your colonoscopy revealed a mass on the right side of the colon concerning for colon cancer.  I took extensive biopsies of this.  We should have these results in 1 to 2 days and I will call you.  I also removed a few other small polyps which are likely benign, hyperplastic.  We will order CT scans of your chest abdomen pelvis for staging purposes.    I will refer you to general surgery to discuss having that part of your colon surgically removed.  I am also going to refer you to Dr. Delton Coombes of oncology.  I want you to start your oral iron therapy today to help build back up your iron stores.   Elon Alas. Abbey Chatters, D.O. Gastroenterology and Hepatology [facility]

## 2023-02-12 NOTE — Anesthesia Preprocedure Evaluation (Signed)
Anesthesia Evaluation  Patient identified by MRN, date of birth, ID band Patient awake    Reviewed: Allergy & Precautions, NPO status , Patient's Chart, lab work & pertinent test results  Airway Mallampati: II  TM Distance: >3 FB Neck ROM: Full   Comment: Cervical fusion  Dental  (+) Dental Advisory Given, Edentulous Upper, Edentulous Lower   Pulmonary former smoker   Pulmonary exam normal breath sounds clear to auscultation       Cardiovascular METS (bilateral lower extremity weakness): hypertension, Pt. on medications Normal cardiovascular exam Rhythm:Regular Rate:Normal     Neuro/Psych  Neuromuscular disease (guillian Barre syndrome)  negative psych ROS   GI/Hepatic hiatal hernia,GERD  Medicated and Controlled,,  Endo/Other    Renal/GU      Musculoskeletal  (+) Arthritis , Osteoarthritis,    Abdominal   Peds  Hematology  (+) Blood dyscrasia, anemia   Anesthesia Other Findings Cervical fusion  Reproductive/Obstetrics                             Anesthesia Physical Anesthesia Plan  ASA: 3  Anesthesia Plan: General   Post-op Pain Management: Minimal or no pain anticipated   Induction: Intravenous  PONV Risk Score and Plan: Propofol infusion  Airway Management Planned: Nasal Cannula and Natural Airway  Additional Equipment:   Intra-op Plan:   Post-operative Plan:   Informed Consent: I have reviewed the patients History and Physical, chart, labs and discussed the procedure including the risks, benefits and alternatives for the proposed anesthesia with the patient or authorized representative who has indicated his/her understanding and acceptance.       Plan Discussed with: CRNA and Surgeon  Anesthesia Plan Comments:        Anesthesia Quick Evaluation

## 2023-02-12 NOTE — Addendum Note (Signed)
Addended by: Cheron Every on: 02/12/2023 02:40 PM   Modules accepted: Orders

## 2023-02-12 NOTE — Telephone Encounter (Signed)
PA approved via [employer] for CT CHEST/ABD/PELV. Auth# [id number], DOS: Feb 13 2023 - Mar 15 2023 CT's scheduled for 2/29, arrival 2pm, npo 4 hrs prior. Called spouse and she is aware of appt details.referrals also sent

## 2023-02-12 NOTE — Op Note (Signed)
[facility] Patient Name: Bradley Trujillo Procedure Date: 02/12/2023 12:51 PM MRN: [id number] Date of Birth: 05-14-1951 Attending MD: Elon Alas. Edgar Frisk, [id number] CSN: [id number] Age: 72 Admit Type: Outpatient Procedure:                Colonoscopy Indications:              Iron deficiency anemia secondary to chronic blood                            loss, Iron deficiency anemia Providers:                Elon Alas. Abbey Chatters, DO, Tammy Vaught, RN, Caprice Kluver, Crystal Page, Illene Labrador Referring MD:              Medicines:                See the Anesthesia note for documentation of the                            administered medications Complications:            No immediate complications. Estimated Blood Loss:     Estimated blood loss was minimal. Procedure:                Pre-Anesthesia Assessment:                           - The anesthesia plan was to use monitored                            anesthesia care (MAC).                           After obtaining informed consent, the colonoscope                            was passed under direct vision. Throughout the                            procedure, the patient's blood pressure, pulse, and                            oxygen saturations were monitored continuously. The                            [id number]-[id number] scope was introduced through                            the anus and advanced to the the cecum, identified                            by appendiceal orifice and ileocecal valve. The                            colonoscopy was  performed without difficulty. The                            patient tolerated the procedure well. The quality                            of the bowel preparation was evaluated using the                            BBPS [facility] Bowel Preparation Scale) with scores                            of: Right Colon = 3, Transverse Colon = 3 and Left                             Colon = 3 (entire mucosa seen well with no residual                            staining, small fragments of stool or opaque                            liquid). The total BBPS score equals 9. Scope In: 1:13:12 PM Scope Out: 1:34:17 PM Scope Withdrawal Time: 0 hours 13 minutes 49 seconds  Total Procedure Duration: 0 hours 21 minutes 5 seconds  Findings:      The perianal and digital rectal examinations were normal.      Multiple medium-mouthed and small-mouthed diverticula were found in the       sigmoid colon and descending colon.      A polypoid, submucosal and ulcerated non-obstructing large mass was       found in the cecum. The mass was partially circumferential (involving       one-third of the lumen circumference). No bleeding was present. Biopsies       were taken with a cold forceps for histology.      Two sessile polyps were found in the sigmoid colon. The polyps were 3 to       4 mm in size. These polyps were removed with a cold snare. Resection and       retrieval were complete.      The exam was otherwise without abnormality. Impression:               - Diverticulosis in the sigmoid colon and in the                            descending colon.                           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 Biopsied.                           - Two 3 to 4 mm polyps in the sigmoid colon,                            removed with a cold snare. Resected and retrieved.                           -  The examination was otherwise normal. Moderate Sedation:      Per Anesthesia Care Recommendation:           - Patient has a contact number available for                            emergencies. The signs and symptoms of potential                            delayed complications were discussed with the                            patient. Return to normal activities tomorrow.                            Written discharge instructions were provided to the                            patient.                            - Resume previous diet.                           - Continue present medications.                           - Await pathology results.                           - Will order staging CT scans chest/abd/pelvis                           - Refer to [facility] to discuss R hemicolectomy                           - Refer to Dr. Delton Coombes of oncology Procedure Code(s):        --- Professional ---                           [phone], Colonoscopy, flexible; with removal of                            tumor(s), polyp(s), or other lesion(s) by snare                            technique                           [id number], 24, Colonoscopy, flexible; with biopsy,                            single or multiple Diagnosis Code(s):        --- Professional ---                           C18.0, Malignant neoplasm of cecum  D12.5, Benign neoplasm of sigmoid colon                           D50.0, Iron deficiency anemia secondary to blood                            loss (chronic)                           D50.9, Iron deficiency anemia, unspecified                           K57.30, Diverticulosis of large intestine without                            perforation or abscess without bleeding CPT copyright 2022 American [employer]. All rights reserved. The codes documented in this report are preliminary and upon coder review may  be revised to meet current compliance requirements. Elon Alas. Abbey Chatters, DO [place] Abbey Chatters, DO 02/12/2023 1:38:31 PM This report has been signed electronically. Number of Addenda: 0

## 2023-02-12 NOTE — Interval H&P Note (Signed)
History and Physical Interval Note:  02/12/2023 12:37 PM  Bradley Trujillo  has presented today for surgery, with the diagnosis of anemia, FH CRC, Gerd.  The various methods of treatment have been discussed with the patient and family. After consideration of risks, benefits and other options for treatment, the patient has consented to  Procedure(s) with comments: COLONOSCOPY WITH PROPOFOL (N/A) - 12:30pm, asa 3 ESOPHAGOGASTRODUODENOSCOPY (EGD) WITH PROPOFOL (N/A) as a surgical intervention.  The patient's history has been reviewed, patient examined, no change in status, stable for surgery.  I have reviewed the patient's chart and labs.  Questions were answered to the patient's satisfaction.     Eloise Harman

## 2023-02-12 NOTE — Op Note (Signed)
[facility] Patient Name: Bradley Trujillo Procedure Date: 02/12/2023 12:51 PM MRN: [id number] Date of Birth: March 17, 1951 Attending MD: Elon Alas. Edgar Frisk, [id number] CSN: [id number] Age: 72 Admit Type: Outpatient Procedure:                Upper GI endoscopy Indications:              Iron deficiency anemia Providers:                Elon Alas. Abbey Chatters, DO, Tammy Vaught, RN, Caprice Kluver, Crystal Page, Illene Labrador Referring MD:              Medicines:                See the Anesthesia note for documentation of the                            administered medications Complications:            No immediate complications. Estimated Blood Loss:     Estimated blood loss: none. Procedure:                Pre-Anesthesia Assessment:                           - The anesthesia plan was to use monitored                            anesthesia care (MAC).                           After obtaining informed consent, the endoscope was                            passed under direct vision. Throughout the                            procedure, the patient's blood pressure, pulse, and                            oxygen saturations were monitored continuously. The                            [id number] scope was introduced through the                            mouth, and advanced to the second part of duodenum.                            The upper GI endoscopy was accomplished without                            difficulty. The patient tolerated the procedure                            well. Scope  In: 1:05:35 PM Scope Out: 1:08:25 PM Total Procedure Duration: 0 hours 2 minutes 50 seconds  Findings:      There is no endoscopic evidence of bleeding, areas of erosion or       esophagitis in the entire esophagus.      A 6 cm hiatal hernia was present. Multiple Cameron erosions. Stomach       otherwise unremarkable.      The duodenal bulb, first portion of the duodenum  and second portion of       the duodenum were normal. Impression:               - 6 cm hiatal hernia.                           - Normal duodenal bulb, first portion of the                            duodenum and second portion of the duodenum.                           - No specimens collected. Moderate Sedation:      Per Anesthesia Care Recommendation:           - Patient has a contact number available for                            emergencies. The signs and symptoms of potential                            delayed complications were discussed with the                            patient. Return to normal activities tomorrow.                            Written discharge instructions were provided to the                            patient.                           - Resume previous diet.                           - Continue present medications.                           - Use Prilosec (omeprazole) 40 mg PO daily.                           - Proceed with colonoscopy Procedure Code(s):        --- Professional ---                           [phone], Esophagogastroduodenoscopy, flexible,                            transoral; diagnostic,  including collection of                            specimen(s) by brushing or washing, when performed                            (separate procedure) Diagnosis Code(s):        --- Professional ---                           K44.9, Diaphragmatic hernia without obstruction or                            gangrene                           D50.9, Iron deficiency anemia, unspecified CPT copyright 2022 American Medical Association. All rights reserved. The codes documented in this report are preliminary and upon coder review may  be revised to meet current compliance requirements. Elon Alas. Abbey Chatters, DO [place] Abbey Chatters, DO 02/12/2023 1:11:37 PM This report has been signed electronically. Number of Addenda: 0

## 2023-02-12 NOTE — Transfer of Care (Signed)
Immediate Anesthesia Transfer of Care Note  Patient: Bradley Trujillo  Procedure(s) Performed: COLONOSCOPY WITH PROPOFOL ESOPHAGOGASTRODUODENOSCOPY (EGD) WITH PROPOFOL BIOPSY POLYPECTOMY INTESTINAL  Patient Location: Short Stay  Anesthesia Type:General  Level of Consciousness: awake and patient cooperative  Airway & Oxygen Therapy: Patient Spontanous Breathing  Post-op Assessment: Report given to RN and Post -op Vital signs reviewed and stable  Post vital signs: Reviewed and stable  Last Vitals:  Vitals Value Taken Time  BP 98/72 02/12/23 1339  Temp 37.1 C 02/12/23 1339  Pulse 96 02/12/23 1339  Resp 19 02/12/23 1339  SpO2 99 % 02/12/23 1339    Last Pain:  Vitals:   02/12/23 1339  TempSrc: Oral  PainSc:          Complications: No notable events documented.

## 2023-02-12 NOTE — Telephone Encounter (Signed)
Just finished colonoscopy on this patient.  Unfortunately has a cecal mass concerning for colon cancer.  I took extensive biopsies of this.  Can we please set up CT chest abdomen pelvis with contrast for staging purposes?  Can we refer to Dr. Arnoldo Morale to discuss right hemicolectomy?  I have already discussed this patient with him.  Can we refer to Dr. Delton Coombes of oncology?  Thank you  Discussed in depth with patient and his wife today.  All questions concerns answered.    Patient would prefer his wife Bradley Trujillo be point of contact to set up all appointments. [phone]

## 2023-02-12 NOTE — Anesthesia Procedure Notes (Signed)
Date/Time: 02/12/2023 12:57 PM  Performed by: Vista Deck, CRNAPre-anesthesia Checklist: Patient identified, Emergency Drugs available, Suction available, Timeout performed and Patient being monitored Patient Re-evaluated:Patient Re-evaluated prior to induction Oxygen Delivery Method: Nasal Cannula

## 2023-02-12 NOTE — Anesthesia Postprocedure Evaluation (Signed)
Anesthesia Post Note  Patient: Bradley Trujillo  Procedure(s) Performed: COLONOSCOPY WITH PROPOFOL ESOPHAGOGASTRODUODENOSCOPY (EGD) WITH PROPOFOL BIOPSY POLYPECTOMY INTESTINAL  Patient location during evaluation: Phase II Anesthesia Type: General Level of consciousness: awake and alert and oriented Pain management: pain level controlled Vital Signs Assessment: post-procedure vital signs reviewed and stable Respiratory status: spontaneous breathing, nonlabored ventilation and respiratory function stable Cardiovascular status: blood pressure returned to baseline and stable Postop Assessment: no apparent nausea or vomiting Anesthetic complications: no  No notable events documented.   Last Vitals:  Vitals:   02/12/23 1209 02/12/23 1339  BP: (!) 125/94 98/72  Pulse: 95 96  Resp: 18 19  Temp: 37 C 37.1 C  SpO2:  99%    Last Pain:  Vitals:   02/12/23 1339  TempSrc: Oral  PainSc:                  [name] C Christine Morton

## 2023-02-13 LAB — TYPE AND SCREEN
ABO/RH(D): O POS
Antibody Screen: NEGATIVE
Unit division: 0
Unit division: 0

## 2023-02-13 LAB — BPAM RBC
Blood Product Expiration Date: 202403192359
Blood Product Expiration Date: 202403192359
ISSUE DATE / TIME: 202402222141
ISSUE DATE / TIME: 202402230057
Unit Type and Rh: 5100
Unit Type and Rh: 5100

## 2023-02-13 LAB — SURGICAL PATHOLOGY

## 2023-02-14 ENCOUNTER — Other Ambulatory Visit: Payer: Self-pay | Admitting: Internal Medicine

## 2023-02-14 MED ORDER — FERROUS SULFATE 325 (65 FE) MG PO TBEC: 325.0000 mg | DELAYED_RELEASE_TABLET | Freq: Every day | ORAL | 5 refills | Status: DC

## 2023-02-15 ENCOUNTER — Ambulatory Visit (HOSPITAL_COMMUNITY): Payer: 59

## 2023-02-16 ENCOUNTER — Ambulatory Visit (HOSPITAL_COMMUNITY)
Admission: RE | Admit: 2023-02-16 | Discharge: 2023-02-16 | Disposition: A | Discharge status: Home / Self Care | Payer: 59 | Source: Ambulatory Visit | Attending: Internal Medicine | Admitting: Internal Medicine

## 2023-02-16 DIAGNOSIS — K5730 Diverticulosis of large intestine without perforation or abscess without bleeding: Secondary | ICD-10-CM | POA: Insufficient documentation

## 2023-02-16 DIAGNOSIS — Z800 Family history of malignant neoplasm of digestive organs: Secondary | ICD-10-CM | POA: Diagnosis present

## 2023-02-16 DIAGNOSIS — K449 Diaphragmatic hernia without obstruction or gangrene: Secondary | ICD-10-CM | POA: Diagnosis not present

## 2023-02-16 DIAGNOSIS — K6389 Other specified diseases of intestine: Secondary | ICD-10-CM | POA: Diagnosis present

## 2023-02-16 MED ORDER — IOHEXOL 300 MG/ML  SOLN
100.0000 mL | Freq: Once | INTRAMUSCULAR | Status: AC | PRN
  Administered 2023-02-16: 100 mL via INTRAVENOUS

## 2023-02-20 ENCOUNTER — Encounter (HOSPITAL_COMMUNITY): Payer: Self-pay | Admitting: Internal Medicine

## 2023-02-20 NOTE — Progress Notes (Signed)
[facility] [address] [place] [address]   Clinic Day:  02/21/2023  Referring physician: Eloise Harman, DO  Patient Care Team: Derek Jack, MD as PCP - General (Hematology) Gala Romney Cristopher Estimable, MD as Consulting Physician (Gastroenterology) Derek Jack, MD as Medical Oncologist (Medical Oncology) Brien Mates, RN as Oncology Nurse Navigator (Medical Oncology)   ASSESSMENT & PLAN:   Assessment:  1.  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 - Received 2 units PRBC and 1 infusion of Ferrlecit to 50 mg.  Never had prior colonoscopy. - Colonoscopy (02/12/2023): Polypoid submucosal ulcerated nonobstructing large mass in the cecum, partially circumferential. - EGD with (02/12/2023): 6 cm hiatal hernia.  Normal duodenal bulb, first part and second part of duodenum. - Pathology: Cecal mass biopsy-adenocarcinoma - CT CAP (02/16/2023): Subtle hypodensity within the central segment 4 measuring 14 mm in the liver.  Prominent lymph node in the ileocolic mesentery measures 4 mm.  No other evidence of metastatic disease.  2.  Social/family history: - Lives at home with his wife.  He had Guillain-Barr syndrome at age 64.  He has bilateral knee pains and uses a cane to ambulate.  He is independent of ADLs and IADLs.  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  Quit smoking cigarettes more than 30 years ago. - Brother had colon cancer.  Father had abdominal cancer.  Nephew died of cancer.  Maternal grandfather had leukemia.   Plan:  1.  Cecal adenocarcinoma: - We discussed colonoscopy and CT scan results with the patient in detail. - I have recommended MRI of the liver with and without contrast for the subtle hypodensity in the central segment 4 on CT scan. - We will get a baseline CEA level. - I will request MSI testing on his biopsy. - He may proceed with right hemicolectomy with Dr.  Arnoldo Morale. - RTC 4 weeks for follow-up to discuss pathology and if he needs adjuvant chemotherapy.  2.  Iron deficiency anemia: - He is currently taking iron tablet daily with MiraLAX as it causes constipation. - To optimize his hemoglobin prior to surgery, I have recommended Venofer 500 mg x 1 dose.   Orders Placed This Encounter  Procedures   MR LIVER W WO CONTRAST    Standing Status:   Future    Standing Expiration Date:   02/21/2024    Order Specific Question:   If indicated for the ordered procedure, I authorize the administration of contrast media per Radiology protocol    Answer:   Yes    Order Specific Question:   What is the patient's sedation requirement?    Answer:   No Sedation    Order Specific Question:   Does the patient have a pacemaker or implanted devices?    Answer:   No    Order Specific Question:   Release to patient    Answer:   Immediate    Order Specific Question:   Preferred imaging location?    Answer:   [facility] (table limit - 550lbs)   CBC with Differential    Standing Status:   Future    Number of Occurrences:   1    Standing Expiration Date:   02/21/2024   Comprehensive metabolic panel    Standing Status:   Future    Number of Occurrences:   1    Standing Expiration Date:   02/21/2024   CEA  Standing Status:   Future    Number of Occurrences:   1    Standing Expiration Date:   02/21/2024   Sample to Blood Bank(Blood Bank Hold)    Standing Status:   Future    Number of Occurrences:   1    Standing Expiration Date:   02/21/2024    Order Specific Question:   Release to patient    Answer:   Immediate      I,Alexis Herring,acting as a scribe for Derek Jack, MD.,have documented all relevant documentation on the behalf of Derek Jack, MD,as directed by  Derek Jack, MD while in the presence of Derek Jack, MD.   I, Derek Jack MD, have reviewed the above documentation for accuracy and completeness, and I  agree with the above.   Derek Jack, MD   3/6/20245:17 PM  CHIEF COMPLAINT/PURPOSE OF CONSULT:   Diagnosis: Cecal adenocarcinoma   Cancer Staging  Cecal cancer [facility]) Staging form: Colon and Rectum, AJCC 8th Edition - Clinical stage from 02/21/2023: Stage Unknown (cTX, cN1, cM0) - Unsigned    Prior Therapy: None  Current Therapy: Right hemicolectomy   HISTORY OF PRESENT ILLNESS:   Oncology History   No history exists.      Bradley Trujillo is a 72 y.o. male presenting to clinic today for evaluation of colonic mass at the request of GI Dr. Hurshel Keys.  Patient was treated in the ED on 02/08/23 for symptomatic anemia with shortness of breath. His HGB was 6.3 at that time. He received 2 units of blood with improvement of HGB to 8.4. He was admitted and had a colonoscopy and EGD on 02/12/23- colonoscopy revealed a malignant tumor in the cecum.  He also had a staging CT CAP on 02/16/2023.  Prior to the presentation to the ER, he did not have any bleeding per rectum or melena.  He never had a colonoscopy.  He was gradually feeling tired.  He denied any weight loss.  Since his colonoscopy, he is taking iron tablet daily with MiraLAX daily as it causes constipation.  He also takes oxycodone as needed for his knee and back pain.  Today, he states that he is doing well overall. His appetite level is at 100%. His energy level is at 75%. He lives at home with his wife and uses cane to ambulate.  He is independent of ADLs and IADLs.   PAST MEDICAL HISTORY:   Past Medical History: Past Medical History:  Diagnosis Date   Arthritis    GERD (gastroesophageal reflux disease)    Guillain-Barre syndrome (HCC)    History of hiatal hernia    Hypertension    Seasonal allergies     Surgical History: Past Surgical History:  Procedure Laterality Date   ANTERIOR CERVICAL DECOMP/DISCECTOMY FUSION N/A 04/23/2013   Procedure: ANTERIOR CERVICAL DECOMPRESSION/DISCECTOMY FUSION 3 LEVELS;  Surgeon:  Floyce Stakes, MD;  Location: [facility];  Service: Neurosurgery;  Laterality: N/A;  Cervical three-four, cervical four-five, cervical five-six Anterior cervical decompression/diskectomy/fusion   APPENDECTOMY     BIOPSY  02/12/2023   Procedure: BIOPSY;  Surgeon: Eloise Harman, DO;  Location: [facility];  Service: Endoscopy;;   CERVICAL FUSION     C7   COLONOSCOPY WITH PROPOFOL N/A 02/12/2023   Procedure: COLONOSCOPY WITH PROPOFOL;  Surgeon: Eloise Harman, DO;  Location: [facility];  Service: Endoscopy;  Laterality: N/A;  12:30pm, asa 3   ESOPHAGOGASTRODUODENOSCOPY (EGD) WITH PROPOFOL N/A 02/12/2023   Procedure: ESOPHAGOGASTRODUODENOSCOPY (EGD) WITH  PROPOFOL;  Surgeon: Eloise Harman, DO;  Location: [facility];  Service: Endoscopy;  Laterality: N/A;   POLYPECTOMY  02/12/2023   Procedure: POLYPECTOMY INTESTINAL;  Surgeon: Eloise Harman, DO;  Location: [facility];  Service: Endoscopy;;    Social History: Social History   Socioeconomic History   Marital status: Married    Spouse name: Not on file   Number of children: Not on file   Years of education: Not on file   Highest education level: Not on file  Occupational History   Not on file  Tobacco Use   Smoking status: Former   Smokeless tobacco: Never  Vaping Use   Vaping Use: Former  Substance and Sexual Activity   Alcohol use: Yes    Alcohol/week: 1.0 standard drink of alcohol    Types: 1 Cans of beer per week    Comment: socially   Drug use: No   Sexual activity: Not on file  Other Topics Concern   Not on file  Social History Narrative   Not on file   Social Determinants of Health   Financial Resource Strain: Not on file  Food Insecurity: No Food Insecurity (02/21/2023)   Hunger Vital Sign    Worried About Running Out of Food in the Last Year: Never true    Ran Out of Food in the Last Year: Never true  Transportation Needs: No Transportation Needs (02/21/2023)   PRAPARE - Civil engineer, contracting (Medical): No    Lack of Transportation (Non-Medical): No  Physical Activity: Not on file  Stress: Not on file  Social Connections: Not on file  Intimate Partner Violence: Not At Risk (02/21/2023)   Humiliation, Afraid, Rape, and Kick questionnaire    Fear of Current or Ex-Partner: No    Emotionally Abused: No    Physically Abused: No    Sexually Abused: No    Family History: History reviewed. No pertinent family history.  Current Medications:  Current Outpatient Medications:    calcium carbonate (TUMS EX) 750 MG chewable tablet, Chew 1 tablet by mouth 3 (three) times daily as needed (indigestion/heartburn.)., Disp: , Rfl:    Cholecalciferol (VITAMIN D3) 125 MCG (5000 UT) TABS, Take 10,000 Units by mouth every other day., Disp: , Rfl:    diphenhydrAMINE (BENADRYL) 25 MG tablet, Take 25 mg by mouth every 6 (six) hours as needed for sleep., Disp: , Rfl:    ferrous sulfate 325 (65 FE) MG EC tablet, Take 1 tablet (325 mg total) by mouth daily with breakfast., Disp: 30 tablet, Rfl: 5   Glycerin-Hypromellose-PEG 400 (VISINE DRY EYE) 0.2-0.2-1 % SOLN, Place 1 drop into both eyes 3 (three) times daily as needed (dry/irritated eyes.)., Disp: , Rfl:    levocetirizine (XYZAL) 5 MG tablet, Take 5 mg by mouth every evening., Disp: , Rfl:    loratadine (CLARITIN) 10 MG tablet, Take 10 mg by mouth daily as needed for allergies., Disp: , Rfl:    Multiple Vitamin (MULTIVITAMIN WITH MINERALS) TABS, Take 1 tablet by mouth daily. Centrum, Disp: , Rfl:    Multiple Vitamins-Minerals (EMERGEN-C IMMUNE) PACK, Take 1 Package by mouth at bedtime., Disp: , Rfl:    Omega-3 Fatty Acids (FISH OIL) 1200 MG CAPS, Take 1,200 mg by mouth daily., Disp: , Rfl:    omeprazole (PRILOSEC) 20 MG capsule, Take 20 mg by mouth daily., Disp: , Rfl:    oxyCODONE-acetaminophen (PERCOCET/ROXICET) 5-325 MG tablet, Take 1-2 tablets by mouth every 4 (four) hours  as needed for severe pain., Disp: 60 tablet, Rfl: 0    Oxymetazoline HCl (SINEX LONG-ACTING NA), Place 1 spray into the nose 2 (two) times daily as needed (congestion)., Disp: , Rfl:    Probiotic CAPS, Take 1 capsule by mouth daily., Disp: , Rfl:    zinc gluconate 50 MG tablet, Take 50 mg by mouth daily., Disp: , Rfl:    Allergies: Allergies  Allergen Reactions   Penicillins Rash    REVIEW OF SYSTEMS:   Review of Systems  Constitutional:  Negative for chills, fatigue and fever.  HENT:   Negative for lump/mass, mouth sores, nosebleeds, sore throat and trouble swallowing.   Eyes:  Negative for eye problems.  Respiratory:  Negative for cough and shortness of breath.   Cardiovascular:  Negative for chest pain, leg swelling and palpitations.  Gastrointestinal:  Negative for abdominal pain, constipation, diarrhea, nausea and vomiting.  Genitourinary:  Negative for bladder incontinence, difficulty urinating, dysuria, frequency, hematuria and nocturia.   Musculoskeletal:  Negative for arthralgias, back pain, flank pain, myalgias and neck pain.  Skin:  Negative for itching and rash.  Neurological:  Negative for dizziness, headaches and numbness.  Hematological:  Does not bruise/bleed easily.  Psychiatric/Behavioral:  Negative for depression, sleep disturbance and suicidal ideas. The patient is not nervous/anxious.   All other systems reviewed and are negative.    VITALS:   Blood pressure (!) 126/90, pulse 86, temperature 97.9 F (36.6 C), temperature source Oral, resp. rate 18, height 5' 4.5" (1.638 m), weight 171 lb 12.8 oz (77.9 kg), SpO2 98 %.  Wt Readings from Last 3 Encounters:  02/21/23 171 lb 12.8 oz (77.9 kg)  02/08/23 177 lb (80.3 kg)  02/07/23 177 lb (80.3 kg)    Body mass index is 29.03 kg/m.  Performance status (ECOG): 1 - Symptomatic but completely ambulatory  PHYSICAL EXAM:   Physical Exam Vitals and nursing note reviewed. Exam conducted with a chaperone present.  Constitutional:      Appearance: Normal appearance.   Cardiovascular:     Rate and Rhythm: Normal rate and regular rhythm.     Pulses: Normal pulses.     Heart sounds: Normal heart sounds.  Pulmonary:     Effort: Pulmonary effort is normal.     Breath sounds: Normal breath sounds.  Abdominal:     Palpations: Abdomen is soft. There is no hepatomegaly, splenomegaly or mass.     Tenderness: There is no abdominal tenderness.  Musculoskeletal:     Right lower leg: No edema.     Left lower leg: No edema.  Lymphadenopathy:     Cervical: No cervical adenopathy.     Right cervical: No superficial, deep or posterior cervical adenopathy.    Left cervical: No superficial, deep or posterior cervical adenopathy.     Upper Body:     Right upper body: No supraclavicular or axillary adenopathy.     Left upper body: No supraclavicular or axillary adenopathy.  Neurological:     General: No focal deficit present.     Mental Status: He is alert and oriented to person, place, and time.  Psychiatric:        Mood and Affect: Mood normal.        Behavior: Behavior normal.     LABS:      Latest Ref Rng & Units 02/21/2023    2:09 PM 02/09/2023    6:20 AM 02/08/2023    7:08 PM  CBC  WBC 4.0 - 10.5 K/uL 7.5  5.8  5.9   Hemoglobin 13.0 - 17.0 g/dL 10.0  8.4  6.3   Hematocrit 39.0 - 52.0 % 35.7  29.7  23.6   Platelets 150 - 400 K/uL 458  405  438       Latest Ref Rng & Units 02/21/2023    2:09 PM 02/09/2023    6:20 AM 02/08/2023    7:08 PM  CMP  Glucose 70 - 99 mg/dL 108  100  106   BUN 8 - 23 mg/dL '16  14  16   '$ Creatinine 0.61 - 1.24 mg/dL 1.06  1.10  1.22   Sodium 135 - 145 mmol/L 136  136  133   Potassium 3.5 - 5.1 mmol/L 4.3  4.4  3.9   Chloride 98 - 111 mmol/L 106  105  105   CO2 22 - 32 mmol/L '23  24  23   '$ Calcium 8.9 - 10.3 mg/dL 9.1  9.0  8.4   Total Protein 6.5 - 8.1 g/dL 7.4   6.6   Total Bilirubin 0.3 - 1.2 mg/dL 0.6   0.4   Alkaline Phos 38 - 126 U/L 68   59   AST 15 - 41 U/L 19   20   ALT 0 - 44 U/L 13   10      No results found  for: "CEA1", "CEA" / No results found for: "CEA1", "CEA" No results found for: "PSA1" No results found for: "[id number]" No results found for: "CAN125"  No results found for: "TOTALPROTELP", "ALBUMINELP", "A1GS", "A2GS", "BETS", "BETA2SER", "GAMS", "MSPIKE", "SPEI" Lab Results  Component Value Date   TIBC 454 (H) 02/08/2023   TIBC 423 02/07/2023   FERRITIN 5 (L) 02/08/2023   FERRITIN 5 (L) 02/07/2023   IRONPCTSAT 2 (L) 02/08/2023   IRONPCTSAT 3 (L) 02/07/2023   No results found for: "LDH"  Pathology 02/12/23: FINAL MICROSCOPIC DIAGNOSIS:   A. COLON, CECAL MASS, BIOPSY:  - Adenocarcinoma.  See comment.   B. COLON, SIGMOID, POLYPECTOMY:  - Hyperplastic polyp, 1 fragment.  No dysplasia or malignancy.   COMMENT:   Dr. Alric Seton has reviewed the case and concurs with the diagnosis.  Dr. Ave Filter office was phoned on 02/13/2023.   GROSS DESCRIPTION:   A.  Received in formalin labeled with the patients name and "Cecal mass  biopsies" is a 1.7 x 1.5 x 0.2 cm aggregate of tan soft tissue  fragments, submitted in toto in a single cassette.   B.  Received in formalin labeled with the patient's name and "Sigmoid  colon polyp" is a 0.8 cm tan, rubbery, polypoid piece of tissue that is  bisected and entirely submitted in a single cassette.   STUDIES:   Colonoscopy 02/12/23: - Diverticulosis in the sigmoid colon and in the descending colon. 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 Biopsied. - Two 3 to 4 mm polyps in the sigmoid colon, removed with a cold snare. Resected and retrieved. - The examination was otherwise normal.  Endoscopy 02/12/23: - 6 cm hiatal hernia. - Normal duodenal bulb, first portion of the duodenum and second portion of the duodenum. - No specimens collected.  CT CHEST ABDOMEN PELVIS W CONTRAST  Result Date: 02/16/2023 CLINICAL DATA:  Cecal mass concerning for colon cancer. * Tracking Code: BO * EXAM: CT CHEST, ABDOMEN, AND PELVIS WITH CONTRAST TECHNIQUE: Multidetector CT  imaging of the chest, abdomen and pelvis was performed following the standard protocol during bolus administration of intravenous contrast. RADIATION DOSE REDUCTION: This exam was performed  according to the departmental dose-optimization program which includes automated exposure control, adjustment of the mA and/or kV according to patient size and/or use of iterative reconstruction technique. CONTRAST:  122m OMNIPAQUE IOHEXOL 300 MG/ML  SOLN COMPARISON:  No recent relevant priors available for comparison at time dictation. FINDINGS: CT CHEST FINDINGS Cardiovascular: Normal caliber thoracic aorta. No central pulmonary embolus on this nondedicated study normal size heart. No significant pericardial effusion/thickening. Mediastinum/Nodes: No suspicious thyroid nodule. No pathologically enlarged mediastinal, hilar or axillary lymph nodes. Large hiatal hernia/intrathoracic stomach Lungs/Pleura: Mild biapical pleuroparenchymal scarring. Scattered bilateral subsegmental atelectasis/scarring. Left upper lobe calcified granuloma no suspicious pulmonary nodules or masses no pleural effusion. No pneumothorax. Musculoskeletal: No aggressive lytic or blastic lesion of bone. Multilevel degenerative changes spine. Partially visualized anterior cervical fusion hardware CT ABDOMEN PELVIS FINDINGS Hepatobiliary: Subtle hypodensity within the central segment IV measuring 14 mm on image 57/2. Gallbladder is unremarkable no biliary ductal dilation. Pancreas: No pancreatic ductal dilation or evidence of acute inflammation Spleen: No splenomegaly Adrenals/Urinary Tract: Bilateral adrenal glands appear normal. No hydronephrosis. Bilateral fluid density renal lesions are compatible with cysts measuring up to 7.8 cm and considered benign requiring no independent imaging follow-up. Mild wall thickening of an incompletely distended urinary bladder Stomach/Bowel: No radiopaque enteric contrast material was administered. No pathologic dilation  of small or large bowel. Colonic diverticulosis without findings of acute diverticulitis. Asymmetric masslike wall thickening of the cecum for instance on image 90/2 and 72/4. Vascular/Lymphatic: Normal caliber abdominal aorta. Smooth IVC contours. No pathologically enlarged abdominal or pelvic lymph nodes. Prominent lymph node in the ileocolic mesentery measures 4 mm on image 83/2 Reproductive: Prostate is unremarkable. Other: No significant abdominopelvic free fluid. No discrete peritoneal or omental nodularity Musculoskeletal: No aggressive lytic or blastic lesion of bone. Lumbosacral fusion hardware with interbody disc spacers. Multilevel degenerative changes spine. Degenerative change of the bilateral hips and SI joints with partial bony ankylosis of the right SI joint. Mild degenerative change of the bilateral hips. Chronic osseous changes of the pubic symphysis IMPRESSION: 1. Asymmetric masslike wall thickening of the cecum, consistent with primary colonic neoplasm. 2. Subtle hypodensity within the central segment IV of the liver measuring 14 mm, nonspecific and favored to reflect focal fatty infiltration however metastatic disease is not excluded. Suggest more definitive characterization by hepatic protocol MRI with and without contrast. 3. Prominent lymph node in the ileocolic mesentery measuring 4 mm, nonspecific but suspicious for local nodal disease. 4. Large hiatal hernia/intrathoracic stomach. 5. Mild wall thickening of an incompletely distended urinary bladder, which may reflect cystitis. Correlate with urinalysis. 6. Colonic diverticulosis without findings of acute diverticulitis. Electronically Signed   By: JDahlia BailiffM.D.   On: 02/16/2023 10:53

## 2023-02-21 ENCOUNTER — Encounter: Payer: Self-pay | Admitting: Hematology

## 2023-02-21 ENCOUNTER — Inpatient Hospital Stay: Payer: 59 | Attending: Hematology | Admitting: Hematology

## 2023-02-21 ENCOUNTER — Inpatient Hospital Stay: Payer: 59

## 2023-02-21 VITALS — BP 126/90 | HR 86 | Temp 97.9°F | Resp 18 | Ht 64.5 in | Wt 171.8 lb

## 2023-02-21 DIAGNOSIS — Z800 Family history of malignant neoplasm of digestive organs: Secondary | ICD-10-CM | POA: Diagnosis not present

## 2023-02-21 DIAGNOSIS — D509 Iron deficiency anemia, unspecified: Secondary | ICD-10-CM | POA: Insufficient documentation

## 2023-02-21 DIAGNOSIS — C180 Malignant neoplasm of cecum: Secondary | ICD-10-CM | POA: Insufficient documentation

## 2023-02-21 DIAGNOSIS — K449 Diaphragmatic hernia without obstruction or gangrene: Secondary | ICD-10-CM | POA: Diagnosis not present

## 2023-02-21 DIAGNOSIS — I10 Essential (primary) hypertension: Secondary | ICD-10-CM | POA: Insufficient documentation

## 2023-02-21 DIAGNOSIS — Z87891 Personal history of nicotine dependence: Secondary | ICD-10-CM | POA: Diagnosis not present

## 2023-02-21 DIAGNOSIS — D649 Anemia, unspecified: Secondary | ICD-10-CM

## 2023-02-21 DIAGNOSIS — K769 Liver disease, unspecified: Secondary | ICD-10-CM

## 2023-02-21 LAB — CBC WITH DIFFERENTIAL/PLATELET
Abs Immature Granulocytes: 0.03 10*3/uL (ref 0.00–0.07)
Basophils Absolute: 0.1 10*3/uL (ref 0.0–0.1)
Basophils Relative: 1 %
Eosinophils Absolute: 0.1 10*3/uL (ref 0.0–0.5)
Eosinophils Relative: 1 %
HCT: 35.7 % — ABNORMAL LOW (ref 39.0–52.0)
Hemoglobin: 10 g/dL — ABNORMAL LOW (ref 13.0–17.0)
Immature Granulocytes: 0 %
Lymphocytes Relative: 14 %
Lymphs Abs: 1.1 10*3/uL (ref 0.7–4.0)
MCH: 19.2 pg — ABNORMAL LOW (ref 26.0–34.0)
MCHC: 28 g/dL — ABNORMAL LOW (ref 30.0–36.0)
MCV: 68.7 fL — ABNORMAL LOW (ref 80.0–100.0)
Monocytes Absolute: 0.4 10*3/uL (ref 0.1–1.0)
Monocytes Relative: 5 %
Neutro Abs: 5.9 10*3/uL (ref 1.7–7.7)
Neutrophils Relative %: 79 %
Platelets: 458 10*3/uL — ABNORMAL HIGH (ref 150–400)
RBC: 5.2 MIL/uL (ref 4.22–5.81)
RDW: 29.5 % — ABNORMAL HIGH (ref 11.5–15.5)
WBC: 7.5 10*3/uL (ref 4.0–10.5)
nRBC: 0 % (ref 0.0–0.2)

## 2023-02-21 LAB — COMPREHENSIVE METABOLIC PANEL
ALT: 13 U/L (ref 0–44)
AST: 19 U/L (ref 15–41)
Albumin: 4 g/dL (ref 3.5–5.0)
Alkaline Phosphatase: 68 U/L (ref 38–126)
Anion gap: 7 (ref 5–15)
BUN: 16 mg/dL (ref 8–23)
CO2: 23 mmol/L (ref 22–32)
Calcium: 9.1 mg/dL (ref 8.9–10.3)
Chloride: 106 mmol/L (ref 98–111)
Creatinine, Ser: 1.06 mg/dL (ref 0.61–1.24)
GFR, Estimated: >60 mL/min (ref >60)
Glucose, Bld: 108 mg/dL — ABNORMAL HIGH (ref 70–99)
Potassium: 4.3 mmol/L (ref 3.5–5.1)
Sodium: 136 mmol/L (ref 135–145)
Total Bilirubin: 0.6 mg/dL (ref 0.3–1.2)
Total Protein: 7.4 g/dL (ref 6.5–8.1)

## 2023-02-21 LAB — SAMPLE TO BLOOD BANK

## 2023-02-21 NOTE — Patient Instructions (Addendum)
[facility]  Discharge Instructions  You were seen and examined today by Dr. Delton Coombes. Dr. Delton Coombes is a medical oncologist, meaning that he specializes in the treatment of cancer diagnoses. Dr. Delton Coombes discussed your past medical history, family history of cancers, and the events that led to you being here today.  You were referred to Dr. Delton Coombes due to a new diagnosis of colon cancer.  Dr. Delton Coombes has reviewed your recent CT scan which revealed questionable areas on your liver. For this, Dr. Delton Coombes will request an MRI of your liver for further visualization. This is not highly concerning.  Proceed with your appointment with the surgeon, they will arrange for curative surgery.  Dr. Delton Coombes will arrange for you to have iron infusions here in the [facility].  Dr. Delton Coombes will also request additional labs today.  Follow-up as scheduled.  Thank you for choosing [facility] to provide your oncology and hematology care.   To afford each patient quality time with our provider, please arrive at least 15 minutes before your scheduled appointment time. You may need to reschedule your appointment if you arrive late (10 or more minutes). Arriving late affects you and other patients whose appointments are after yours.  Also, if you miss three or more appointments without notifying the office, you may be dismissed from the clinic at the provider's discretion.    Again, thank you for choosing [facility].  Our hope is that these requests will decrease the amount of time that you wait before being seen by our physicians.   If you have a lab appointment with the [facility] please come in thru the Main Entrance and check in at the main information desk.           _____________________________________________________________  Should you have questions after your visit to [facility], please contact  our office at [phone] and follow the prompts.  Our office hours are 8:00 a.m. to 4:30 p.m. Monday - Thursday and 8:00 a.m. to 2:30 p.m. Friday.  Please note that voicemails left after 4:00 p.m. may not be returned until the following business day.  We are closed weekends and all major holidays.  You do have access to a nurse 24-7, just call the main number to the clinic [phone] and do not press any options, hold on the line and a nurse will answer the phone.    For prescription refill requests, have your pharmacy contact our office and allow 72 hours.    Masks are optional in the cancer centers. If you would like for your care team to wear a mask while they are taking care of you, please let them know. You may have one support person who is at least 71 years old accompany you for your appointments.

## 2023-02-22 ENCOUNTER — Ambulatory Visit (INDEPENDENT_AMBULATORY_CARE_PROVIDER_SITE_OTHER): Payer: 59 | Admitting: General Surgery

## 2023-02-22 ENCOUNTER — Encounter: Payer: Self-pay | Admitting: General Surgery

## 2023-02-22 ENCOUNTER — Encounter: Payer: Self-pay | Admitting: Hematology

## 2023-02-22 VITALS — BP 126/76 | HR 90 | Temp 98.2°F | Resp 14 | Ht 64.0 in | Wt 171.0 lb

## 2023-02-22 DIAGNOSIS — C180 Malignant neoplasm of cecum: Secondary | ICD-10-CM | POA: Diagnosis not present

## 2023-02-22 NOTE — Progress Notes (Signed)
Bradley Trujillo; [id number]; July 26, 1951   HPI Patient is a 72 year old white male who was referred to my care by Dr. 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  He was found on recent colonoscopy for anemia to have a colon cancer.  It was placed in the cecum.  Patient denies any abdominal pain, nausea, or vomiting.  He has not noticed any blood in his stools.  There is no family history of colon cancer.  He has been seen by Dr. Delton Coombes of oncology and is undergoing an MRI of his liver due to a small lesion in segment 4 of the liver.  This is on 02/25/2023.  Patient's bowel movements have been within normal limits. Past Medical History:  Diagnosis Date   Arthritis    GERD (gastroesophageal reflux disease)    Guillain-Barre syndrome (HCC)    History of hiatal hernia    Hypertension    Seasonal allergies     Past Surgical History:  Procedure Laterality Date   ANTERIOR CERVICAL DECOMP/DISCECTOMY FUSION N/A 04/23/2013   Procedure: ANTERIOR CERVICAL DECOMPRESSION/DISCECTOMY FUSION 3 LEVELS;  Surgeon: Floyce Stakes, MD;  Location: [facility];  Service: Neurosurgery;  Laterality: N/A;  Cervical three-four, cervical four-five, cervical five-six Anterior cervical decompression/diskectomy/fusion   APPENDECTOMY     BIOPSY  02/12/2023   Procedure: BIOPSY;  Surgeon: Eloise Harman, DO;  Location: [facility];  Service: Endoscopy;;   CERVICAL FUSION     C7   COLONOSCOPY WITH PROPOFOL N/A 02/12/2023   Procedure: COLONOSCOPY WITH PROPOFOL;  Surgeon: Eloise Harman, DO;  Location: [facility];  Service: Endoscopy;  Laterality: N/A;  12:30pm, asa 3   ESOPHAGOGASTRODUODENOSCOPY (EGD) WITH PROPOFOL N/A 02/12/2023   Procedure: ESOPHAGOGASTRODUODENOSCOPY (EGD) WITH PROPOFOL;  Surgeon: Eloise Harman, DO;  Location: [facility];  Service: Endoscopy;  Laterality: N/A;   POLYPECTOMY  02/12/2023   Procedure: POLYPECTOMY INTESTINAL;  Surgeon: Eloise Harman, DO;   Location: [facility];  Service: Endoscopy;;    History reviewed. No pertinent family history.  Current Outpatient Medications on File Prior to Visit  Medication Sig Dispense Refill   calcium carbonate (TUMS EX) 750 MG chewable tablet Chew 1 tablet by mouth 3 (three) times daily as needed (indigestion/heartburn.).     Cholecalciferol (VITAMIN D3) 125 MCG (5000 UT) TABS Take 10,000 Units by mouth every other day.     diphenhydrAMINE (BENADRYL) 25 MG tablet Take 25 mg by mouth every 6 (six) hours as needed for sleep.     ferrous sulfate 325 (65 FE) MG EC tablet Take 1 tablet (325 mg total) by mouth daily with breakfast. 30 tablet 5   Glycerin-Hypromellose-PEG 400 (VISINE DRY EYE) 0.2-0.2-1 % SOLN Place 1 drop into both eyes 3 (three) times daily as needed (dry/irritated eyes.).     levocetirizine (XYZAL) 5 MG tablet Take 5 mg by mouth every evening.     loratadine (CLARITIN) 10 MG tablet Take 10 mg by mouth daily as needed for allergies.     Multiple Vitamin (MULTIVITAMIN WITH MINERALS) TABS Take 1 tablet by mouth daily. Centrum     Multiple Vitamins-Minerals (EMERGEN-C IMMUNE) PACK Take 1 Package by mouth at bedtime.     Omega-3 Fatty Acids (FISH OIL) 1200 MG CAPS Take 1,200 mg by mouth daily.     omeprazole (PRILOSEC) 20 MG capsule Take 20 mg by mouth daily.     oxyCODONE-acetaminophen (PERCOCET/ROXICET) 5-325 MG tablet Take 1-2 tablets by mouth every 4 (four)  hours as needed for severe pain. 60 tablet 0   Oxymetazoline HCl (SINEX LONG-ACTING NA) Place 1 spray into the nose 2 (two) times daily as needed (congestion).     Probiotic CAPS Take 1 capsule by mouth daily.     zinc gluconate 50 MG tablet Take 50 mg by mouth daily.     No current facility-administered medications on file prior to visit.    Allergies  Allergen Reactions   Penicillins Rash    Social History   Substance and Sexual Activity  Alcohol Use Yes   Alcohol/week: 1.0 standard drink of alcohol   Types: 1 Cans of  beer per week   Comment: socially    Social History   Tobacco Use  Smoking Status Former  Smokeless Tobacco Never    Review of Systems  Constitutional: Negative.   HENT:  Positive for sinus pain.   Eyes: Negative.   Respiratory: Negative.    Cardiovascular: Negative.   Genitourinary:  Positive for frequency.  Musculoskeletal:  Positive for back pain, joint pain and neck pain.  Skin: Negative.   Neurological: Negative.   Endo/Heme/Allergies: Negative.   Psychiatric/Behavioral: Negative.      Objective   Vitals:   02/22/23 1020  BP: 126/76  Pulse: 90  Resp: 14  Temp: 98.2 F (36.8 C)  SpO2: 96%    Physical Exam Vitals reviewed.  Constitutional:      Appearance: Normal appearance. He is normal weight. He is not ill-appearing.  HENT:     Head: Normocephalic and atraumatic.  Cardiovascular:     Rate and Rhythm: Normal rate and regular rhythm.     Heart sounds: Normal heart sounds. No murmur heard.    No friction rub. No gallop.  Pulmonary:     Effort: Pulmonary effort is normal. No respiratory distress.     Breath sounds: Normal breath sounds. No stridor. No wheezing, rhonchi or rales.  Abdominal:     General: Bowel sounds are normal. There is no distension.     Palpations: Abdomen is soft. There is no mass.     Tenderness: There is no abdominal tenderness. There is no guarding or rebound.     Hernia: No hernia is present.  Skin:    General: Skin is warm and dry.  Neurological:     Mental Status: He is alert and oriented to person, place, and time.   Drs. Delton Coombes and Carver's notes reviewed CT scan images personally reviewed.  Pathology reviewed Assessment  Cecal carcinoma, liver lesion. Plan  Patient is scheduled for robotic assisted laparoscopic partial colectomy on 03/09/2023 pending the results of the MRI.  The risks and benefits of the procedure including bleeding, infection, cardiopulmonary difficulties, the possibility of a blood transfusion, and  the addition of an open procedure were fully explained to the patient, who gave informed consent.  He is undergoing iron infusions which will be performed on 02/27/2023.  I will be seeing him back in my office after his infusion and to discuss the results of the MRI.

## 2023-02-23 LAB — CEA: CEA: 1.8 ng/mL (ref 0.0–4.7)

## 2023-02-25 ENCOUNTER — Ambulatory Visit (HOSPITAL_COMMUNITY)
Admission: RE | Admit: 2023-02-25 | Discharge: 2023-02-25 | Disposition: A | Discharge status: Home / Self Care | Payer: 59 | Source: Ambulatory Visit | Attending: Hematology | Admitting: Hematology

## 2023-02-25 DIAGNOSIS — K769 Liver disease, unspecified: Secondary | ICD-10-CM

## 2023-02-25 MED ORDER — GADOBUTROL 1 MMOL/ML IV SOLN
7.5000 mL | Freq: Once | INTRAVENOUS | Status: AC | PRN
  Administered 2023-02-25: 7.5 mL via INTRAVENOUS

## 2023-02-27 ENCOUNTER — Ambulatory Visit: Payer: 59 | Admitting: General Surgery

## 2023-02-27 ENCOUNTER — Inpatient Hospital Stay: Payer: 59

## 2023-02-27 VITALS — BP 109/75 | HR 81 | Temp 97.2°F | Resp 20

## 2023-02-27 DIAGNOSIS — C180 Malignant neoplasm of cecum: Secondary | ICD-10-CM | POA: Diagnosis not present

## 2023-02-27 DIAGNOSIS — D509 Iron deficiency anemia, unspecified: Secondary | ICD-10-CM

## 2023-02-27 MED ORDER — CETIRIZINE HCL 10 MG PO TABS
10.0000 mg | ORAL_TABLET | Freq: Once | ORAL | Status: AC
  Administered 2023-02-27: 10 mg via ORAL
  Filled 2023-02-27: qty 1

## 2023-02-27 MED ORDER — SODIUM CHLORIDE 0.9 % IV SOLN: Freq: Once | INTRAVENOUS | Status: AC

## 2023-02-27 MED ORDER — SODIUM CHLORIDE 0.9 % IV SOLN
500.0000 mg | Freq: Once | INTRAVENOUS | Status: AC
  Administered 2023-02-27: 500 mg via INTRAVENOUS
  Filled 2023-02-27: qty 25

## 2023-02-27 MED ORDER — ACETAMINOPHEN 325 MG PO TABS
650.0000 mg | ORAL_TABLET | Freq: Once | ORAL | Status: AC
  Administered 2023-02-27: 650 mg via ORAL
  Filled 2023-02-27: qty 2

## 2023-02-27 NOTE — Patient Instructions (Signed)
[facility]  Discharge Instructions: Thank you for choosing [facility] to provide your oncology and hematology care.  If you have a lab appointment with the [facility], please come in thru the Main Entrance and check in at the main information desk.  Wear comfortable clothing and clothing appropriate for easy access to any Portacath or PICC line.   We strive to give you quality time with your provider. You may need to reschedule your appointment if you arrive late (15 or more minutes).  Arriving late affects you and other patients whose appointments are after yours.  Also, if you miss three or more appointments without notifying the office, you may be dismissed from the clinic at the provider's discretion.      For prescription refill requests, have your pharmacy contact our office and allow 72 hours for refills to be completed.    Today you received the following Venofer '500mg'$  infusion.   To help prevent nausea and vomiting after your treatment, we encourage you to take your nausea medication as directed.  BELOW ARE SYMPTOMS THAT SHOULD BE REPORTED IMMEDIATELY: *FEVER GREATER THAN 100.4 F (38 C) OR HIGHER *CHILLS OR SWEATING *NAUSEA AND VOMITING THAT IS NOT CONTROLLED WITH YOUR NAUSEA MEDICATION *UNUSUAL SHORTNESS OF BREATH *UNUSUAL BRUISING OR BLEEDING *URINARY PROBLEMS (pain or burning when urinating, or frequent urination) *BOWEL PROBLEMS (unusual diarrhea, constipation, pain near the anus) TENDERNESS IN MOUTH AND THROAT WITH OR WITHOUT PRESENCE OF ULCERS (sore throat, sores in mouth, or a toothache) UNUSUAL RASH, SWELLING OR PAIN  UNUSUAL VAGINAL DISCHARGE OR ITCHING   Items with * indicate a potential emergency and should be followed up as soon as possible or go to the Emergency Department if any problems should occur.  Please show the CHEMOTHERAPY ALERT CARD or IMMUNOTHERAPY ALERT CARD at check-in to the Emergency Department and triage  nurse.  Should you have questions after your visit or need to cancel or reschedule your appointment, please contact [facility] [phone]  and follow the prompts.  Office hours are 8:00 a.m. to 4:30 p.m. Monday - Friday. Please note that voicemails left after 4:00 p.m. may not be returned until the following business day.  We are closed weekends and major holidays. You have access to a nurse at all times for urgent questions. Please call the main number to the clinic [phone] and follow the prompts.  For any non-urgent questions, you may also contact your provider using MyChart. We now offer e-Visits for anyone 29 and older to request care online for non-urgent symptoms. For details visit [email or web address].   Also download the MyChart app! Go to the app store, search "MyChart", open the app, select [facility], and log in with your MyChart username and password.

## 2023-02-27 NOTE — Progress Notes (Signed)
Patient presents today for Venofer infusion. Patient in satisfactory condition with no new complaints voiced. Vital signs stable. IV successfully placed in right hand, blood return noted, and flushed. We will proceed with treatment per MD orders.   Patient presents today for treatment per orders.  Patient tolerated treatment well with no complaints voiced.  Patient left via wheelchair with wife in stable condition.  Vital signs stable at discharge.  Follow up as scheduled.

## 2023-02-28 ENCOUNTER — Other Ambulatory Visit (HOSPITAL_COMMUNITY): Payer: Medicare HMO

## 2023-03-01 ENCOUNTER — Other Ambulatory Visit: Payer: Self-pay | Admitting: General Surgery

## 2023-03-01 ENCOUNTER — Ambulatory Visit (INDEPENDENT_AMBULATORY_CARE_PROVIDER_SITE_OTHER): Payer: 59 | Admitting: General Surgery

## 2023-03-01 ENCOUNTER — Encounter: Payer: Self-pay | Admitting: General Surgery

## 2023-03-01 VITALS — BP 134/92 | HR 73 | Temp 98.2°F | Resp 14 | Ht 64.0 in | Wt 171.0 lb

## 2023-03-01 DIAGNOSIS — C180 Malignant neoplasm of cecum: Secondary | ICD-10-CM | POA: Diagnosis not present

## 2023-03-01 MED ORDER — SUTAB 1479-225-188 MG PO TABS: 24.0000 | ORAL_TABLET | Freq: Once | ORAL | 0 refills | Status: AC

## 2023-03-01 MED ORDER — NEOMYCIN SULFATE 500 MG PO TABS: 1000.0000 mg | ORAL_TABLET | Freq: Three times a day (TID) | ORAL | 0 refills | Status: DC

## 2023-03-01 MED ORDER — METRONIDAZOLE 500 MG PO TABS: 500.0000 mg | ORAL_TABLET | Freq: Three times a day (TID) | ORAL | 0 refills | Status: DC

## 2023-03-02 ENCOUNTER — Other Ambulatory Visit (HOSPITAL_BASED_OUTPATIENT_CLINIC_OR_DEPARTMENT_OTHER): Payer: Medicare HMO

## 2023-03-02 NOTE — Progress Notes (Signed)
MRI results reviewed with patient.  The previously seen lesion in the liver on CT scan was not seen on MRI.  Will proceed with robotic assisted laparoscopic partial colectomy.  The risks and benefits of the procedure including bleeding, infection, cardiopulmonary difficulties, anastomotic leak, and the possibility of a blood transfusion were fully explained to the patient, who gave informed consent.  Sutabs, neomycin, and Flagyl were prescribed for preoperative bowel prep.

## 2023-03-02 NOTE — Patient Instructions (Signed)
Bradley Trujillo  03/02/2023     @PREFPERIOPPHARMACY @   Your procedure is scheduled on  03/09/2023.   Report to [facility] at  0600  A.M.   Call this number if you have problems the morning of surgery:  [phone]  If you experience any cold or flu symptoms such as cough, fever, chills, shortness of breath, etc. between now and your scheduled surgery, please notify us at the above number.   Remember:  Do not eat or drink after midnight.       Follow the diet and prep instructions given to you by Dr Arnoldo Morale.      Take these medicines the morning of surgery with A SIP OF WATER              xyzal, claritin, omeprazole, oxycodone(if needed).     Do not wear jewelry, make-up or nail polish.  Do not wear lotions, powders, or perfumes, or deodorant.  Do not shave 48 hours prior to surgery.  Men may shave face and neck.  Do not bring valuables to the hospital.  [facility] is not responsible for any belongings or valuables.  Contacts, dentures or bridgework may not be worn into surgery.  Leave your suitcase in the car.  After surgery it may be brought to your room.  For patients admitted to the hospital, discharge time will be determined by your treatment team.  Patients discharged the day of surgery will not be allowed to drive home.    Special instructions:   DO NOT smoke tobacco or vape for 24 hours before your procedure.  Please read over the following fact sheets that you were given. Pain Booklet, Coughing and Deep Breathing, Blood Transfusion Information, Surgical Site Infection Prevention, Anesthesia Post-op Instructions, and Care and Recovery After Surgery        Minimally Invasive Partial Colectomy, Adult, Care After The following information offers guidance on how to care for yourself after your procedure. Your health care provider may also give you more specific instructions. If you have problems or questions, contact your health care provider. What can I  expect after the surgery? After the procedure, it is common to have: Pain, bruising, and swelling. Bloating. Weakness and tiredness (fatigue). Changes to your bowel movements, especially having bowel movements more often. Follow these instructions at home: Medicines Take over-the-counter and prescription medicines only as told by your health care provider. If you were prescribed an antibiotic medicine, take it as told by your health care provider. Do not stop using the antibiotic even if you start to feel better. Ask your health care provider if the medicine prescribed to you: Requires you to avoid driving or using machinery. Can cause constipation. You may need to take these actions to prevent or treat constipation: Drink enough fluids to keep your urine pale yellow. Take over-the-counter or prescription medicines. Limit foods that are high in fat and processed sugars, such as fried or sweet foods. Eating and drinking Follow instructions from your health care provider about what you may eat and drink. Do not drink alcohol if your health care provider tells you not to drink. Eat a low-fiber diet for the first 4 weeks after surgery or as told by your health care provider.. Most people on a low-fiber eating plan should eat less than 10 grams (g) of fiber a day. Follow recommendations from your health care provider or dietitian about how much fiber you should have each day. Always check food labels  to know the fiber content of packaged foods. In general, a low-fiber food will have fewer than 2 g of fiber per serving. In general, try to avoid whole grains, raw fruits and vegetables, dried fruit, tough cuts of meat, nuts, and seeds. Incision care  Follow instructions from your health care provider about how to take care of your incisions. Make sure you: Wash your hands with soap and water for at least 20 seconds before and after you change your bandage (dressing). If soap and water are not  available, use hand sanitizer. Change your dressing as told by your health care provider. Leave stitches (sutures), skin glue, or adhesive strips in place. These skin closures may need to stay in place for 2 weeks or longer. If adhesive strip edges start to loosen and curl up, you may trim the loose edges. Do not remove adhesive strips completely unless your health care provider tells you to do that. Check your incision area every day for signs of infection. Check for: More redness, swelling, or pain. Fluid or blood. Warmth. Pus or a bad smell. Activity Rest as told by your health care provider. Avoid sitting for a long time without moving. Get up to take short walks every 1-2 hours. This is important to improve blood flow and breathing. Ask for help if you feel weak or unsteady. You may have to avoid lifting. Ask your health care provider how much you can safely lift. Return to your normal activities as told by your health care provider. Ask your health care provider what activities are safe for you. General instructions Do not use any products that contain nicotine or tobacco. These products include cigarettes, chewing tobacco, and vaping devices, such as e-cigarettes. If you need help quitting, ask your health care provider. Do not take baths, swim, or use a hot tub until your health care provider approves. Ask your health care provider if you may take showers. You may only be allowed to take sponge baths. Wear compression stockings as told by your health care provider. These stockings help to prevent blood clots and reduce swelling in your legs. Keep all follow-up visits. This is important to monitor healing and check for any complications. Contact a health care provider if: Medicine is not controlling your pain. You have chills or fever. You have any signs of infection in your incision areas. You have a persistent cough. You have nausea or vomiting. You develop a rash. You have not had  a bowel movement in 3 days. Get help right away if: You have severe pain. Your incisions break open after sutures or staples have been removed. You are bleeding from your rectum or have blood in your stool. You have a warm, tender swelling in your leg. You have chest pain or trouble breathing. You have increased swelling in the abdomen. You feel light-headed or you faint. These symptoms may be an emergency. Get help right away. Call 911. Do not wait to see if the symptoms will go away. Do not drive yourself to the hospital. Summary After surgery, it is common to have some pain, bruising, swelling, bloating, tiredness, weakness, or changes to your bowel movements. Follow instructions from your health care provider about what to eat and drink. Return to your normal activities as told by your health care provider. Check your incision area every day for signs of infection. Get help right away if you have chest pain or trouble breathing. This information is not intended to replace advice given to you  by your health care provider. Make sure you discuss any questions you have with your health care provider. Document Revised: 03/22/2022 Document Reviewed: 03/22/2022 Elsevier Patient Education  [place] Anesthesia, Adult, Care After The following information offers guidance on how to care for yourself after your procedure. Your health care provider may also give you more specific instructions. If you have problems or questions, contact your health care provider. What can I expect after the procedure? After the procedure, it is common for people to: Have pain or discomfort at the IV site. Have nausea or vomiting. Have a sore throat or hoarseness. Have trouble concentrating. Feel cold or chills. Feel weak, sleepy, or tired (fatigue). Have soreness and body aches. These can affect parts of the body that were not involved in surgery. Follow these instructions at home: For the  time period you were told by your health care provider:  Rest. Do not participate in activities where you could fall or become injured. Do not drive or use machinery. Do not drink alcohol. Do not take sleeping pills or medicines that cause drowsiness. Do not make important decisions or sign legal documents. Do not take care of children on your own. General instructions Drink enough fluid to keep your urine pale yellow. If you have sleep apnea, surgery and certain medicines can increase your risk for breathing problems. Follow instructions from your health care provider about wearing your sleep device: Anytime you are sleeping, including during daytime naps. While taking prescription pain medicines, sleeping medicines, or medicines that make you drowsy. Return to your normal activities as told by your health care provider. Ask your health care provider what activities are safe for you. Take over-the-counter and prescription medicines only as told by your health care provider. Do not use any products that contain nicotine or tobacco. These products include cigarettes, chewing tobacco, and vaping devices, such as e-cigarettes. These can delay incision healing after surgery. If you need help quitting, ask your health care provider. Contact a health care provider if: You have nausea or vomiting that does not get better with medicine. You vomit every time you eat or drink. You have pain that does not get better with medicine. You cannot urinate or have bloody urine. You develop a skin rash. You have a fever. Get help right away if: You have trouble breathing. You have chest pain. You vomit blood. These symptoms may be an emergency. Get help right away. Call 911. Do not wait to see if the symptoms will go away. Do not drive yourself to the hospital. Summary After the procedure, it is common to have a sore throat, hoarseness, nausea, vomiting, or to feel weak, sleepy, or fatigue. For the  time period you were told by your health care provider, do not drive or use machinery. Get help right away if you have difficulty breathing, have chest pain, or vomit blood. These symptoms may be an emergency. This information is not intended to replace advice given to you by your health care provider. Make sure you discuss any questions you have with your health care provider. Document Revised: 03/03/2022 Document Reviewed: 03/03/2022 Elsevier Patient Education  [place]. How to Use Chlorhexidine Before Surgery Chlorhexidine gluconate (CHG) is a germ-killing (antiseptic) solution that is used to clean the skin. It can get rid of the bacteria that normally live on the skin and can keep them away for about 24 hours. To clean your skin with CHG, you may be given: A CHG solution  to use in the shower or as part of a sponge bath. A prepackaged cloth that contains CHG. Cleaning your skin with CHG may help lower the risk for infection: While you are staying in the intensive care unit of the hospital. If you have a vascular access, such as a central line, to provide short-term or long-term access to your veins. If you have a catheter to drain urine from your bladder. If you are on a ventilator. A ventilator is a machine that helps you breathe by moving air in and out of your lungs. After surgery. What are the risks? Risks of using CHG include: A skin reaction. Hearing loss, if CHG gets in your ears and you have a perforated eardrum. Eye injury, if CHG gets in your eyes and is not rinsed out. The CHG product catching fire. Make sure that you avoid smoking and flames after applying CHG to your skin. Do not use CHG: If you have a chlorhexidine allergy or have previously reacted to chlorhexidine. On babies younger than 77 months of age. How to use CHG solution Use CHG only as told by your health care provider, and follow the instructions on the label. Use the full amount of CHG as directed.  Usually, this is one bottle. During a shower Follow these steps when using CHG solution during a shower (unless your health care provider gives you different instructions): Start the shower. Use your normal soap and shampoo to wash your face and hair. Turn off the shower or move out of the shower stream. Pour the CHG onto a clean washcloth. Do not use any type of brush or rough-edged sponge. Starting at your neck, lather your body down to your toes. Make sure you follow these instructions: If you will be having surgery, pay special attention to the part of your body where you will be having surgery. Scrub this area for at least 1 minute. Do not use CHG on your head or face. If the solution gets into your ears or eyes, rinse them well with water. Avoid your genital area. Avoid any areas of skin that have broken skin, cuts, or scrapes. Scrub your back and under your arms. Make sure to wash skin folds. Let the lather sit on your skin for 1-2 minutes or as long as told by your health care provider. Thoroughly rinse your entire body in the shower. Make sure that all body creases and crevices are rinsed well. Dry off with a clean towel. Do not put any substances on your body afterward--such as powder, lotion, or perfume--unless you are told to do so by your health care provider. Only use lotions that are recommended by the manufacturer. Put on clean clothes or pajamas. If it is the night before your surgery, sleep in clean sheets.  During a sponge bath Follow these steps when using CHG solution during a sponge bath (unless your health care provider gives you different instructions): Use your normal soap and shampoo to wash your face and hair. Pour the CHG onto a clean washcloth. Starting at your neck, lather your body down to your toes. Make sure you follow these instructions: If you will be having surgery, pay special attention to the part of your body where you will be having surgery. Scrub this  area for at least 1 minute. Do not use CHG on your head or face. If the solution gets into your ears or eyes, rinse them well with water. Avoid your genital area. Avoid any areas of skin  that have broken skin, cuts, or scrapes. Scrub your back and under your arms. Make sure to wash skin folds. Let the lather sit on your skin for 1-2 minutes or as long as told by your health care provider. Using a different clean, wet washcloth, thoroughly rinse your entire body. Make sure that all body creases and crevices are rinsed well. Dry off with a clean towel. Do not put any substances on your body afterward--such as powder, lotion, or perfume--unless you are told to do so by your health care provider. Only use lotions that are recommended by the manufacturer. Put on clean clothes or pajamas. If it is the night before your surgery, sleep in clean sheets. How to use CHG prepackaged cloths Only use CHG cloths as told by your health care provider, and follow the instructions on the label. Use the CHG cloth on clean, dry skin. Do not use the CHG cloth on your head or face unless your health care provider tells you to. When washing with the CHG cloth: Avoid your genital area. Avoid any areas of skin that have broken skin, cuts, or scrapes. Before surgery Follow these steps when using a CHG cloth to clean before surgery (unless your health care provider gives you different instructions): Using the CHG cloth, vigorously scrub the part of your body where you will be having surgery. Scrub using a back-and-forth motion for 3 minutes. The area on your body should be completely wet with CHG when you are done scrubbing. Do not rinse. Discard the cloth and let the area air-dry. Do not put any substances on the area afterward, such as powder, lotion, or perfume. Put on clean clothes or pajamas. If it is the night before your surgery, sleep in clean sheets.  For general bathing Follow these steps when using CHG  cloths for general bathing (unless your health care provider gives you different instructions). Use a separate CHG cloth for each area of your body. Make sure you wash between any folds of skin and between your fingers and toes. Wash your body in the following order, switching to a new cloth after each step: The front of your neck, shoulders, and chest. Both of your arms, under your arms, and your hands. Your stomach and groin area, avoiding the genitals. Your right leg and foot. Your left leg and foot. The back of your neck, your back, and your buttocks. Do not rinse. Discard the cloth and let the area air-dry. Do not put any substances on your body afterward--such as powder, lotion, or perfume--unless you are told to do so by your health care provider. Only use lotions that are recommended by the manufacturer. Put on clean clothes or pajamas. Contact a health care provider if: Your skin gets irritated after scrubbing. You have questions about using your solution or cloth. You swallow any chlorhexidine. Call your local poison control center (1-5626476345 in the [place] Get help right away if: Your eyes itch badly, or they become very red or swollen. Your skin itches badly and is red or swollen. Your hearing changes. You have trouble seeing. You have swelling or tingling in your mouth or throat. You have trouble breathing. These symptoms may represent a serious problem that is an emergency. Do not wait to see if the symptoms will go away. Get medical help right away. Call your local emergency services (911 in the [place] Do not drive yourself to the hospital. Summary Chlorhexidine gluconate (CHG) is a germ-killing (antiseptic) solution that is used to  clean the skin. Cleaning your skin with CHG may help to lower your risk for infection. You may be given CHG to use for bathing. It may be in a bottle or in a prepackaged cloth to use on your skin. Carefully follow your health care provider's  instructions and the instructions on the product label. Do not use CHG if you have a chlorhexidine allergy. Contact your health care provider if your skin gets irritated after scrubbing. This information is not intended to replace advice given to you by your health care provider. Make sure you discuss any questions you have with your health care provider. Document Revised: 04/03/2022 Document Reviewed: 02/14/2021 Elsevier Patient Education  [place].

## 2023-03-02 NOTE — H&P (Signed)
Bradley Trujillo; [id number]; 08/10/1951   HPI Patient is a 71-year-old white male who was referred to my care by Dr. 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  He was found on recent colonoscopy for anemia to have a colon cancer.  It was placed in the cecum.  Patient denies any abdominal pain, nausea, or vomiting.  He has not noticed any blood in his stools.  There is no family history of colon cancer.  He has been seen by Dr. Katragadda of oncology and is undergoing an MRI of his liver due to a small lesion in segment 4 of the liver.  This is on 02/25/2023.  Patient's bowel movements have been within normal limits. Past Medical History:  Diagnosis Date   Arthritis    GERD (gastroesophageal reflux disease)    Guillain-Barre syndrome (HCC)    History of hiatal hernia    Hypertension    Seasonal allergies     Past Surgical History:  Procedure Laterality Date   ANTERIOR CERVICAL DECOMP/DISCECTOMY FUSION N/A 04/23/2013   Procedure: ANTERIOR CERVICAL DECOMPRESSION/DISCECTOMY FUSION 3 LEVELS;  Surgeon: Ernesto M Botero, MD;  Location: [facility];  Service: Neurosurgery;  Laterality: N/A;  Cervical three-four, cervical four-five, cervical five-six Anterior cervical decompression/diskectomy/fusion   APPENDECTOMY     BIOPSY  02/12/2023   Procedure: BIOPSY;  Surgeon: Carver, Charles K, DO;  Location: [facility];  Service: Endoscopy;;   CERVICAL FUSION     C7   COLONOSCOPY WITH PROPOFOL N/A 02/12/2023   Procedure: COLONOSCOPY WITH PROPOFOL;  Surgeon: Carver, Charles K, DO;  Location: [facility];  Service: Endoscopy;  Laterality: N/A;  12:30pm, asa 3   ESOPHAGOGASTRODUODENOSCOPY (EGD) WITH PROPOFOL N/A 02/12/2023   Procedure: ESOPHAGOGASTRODUODENOSCOPY (EGD) WITH PROPOFOL;  Surgeon: Carver, Charles K, DO;  Location: [facility];  Service: Endoscopy;  Laterality: N/A;   POLYPECTOMY  02/12/2023   Procedure: POLYPECTOMY INTESTINAL;  Surgeon: Carver, Charles K, DO;   Location: [facility];  Service: Endoscopy;;    History reviewed. No pertinent family history.  Current Outpatient Medications on File Prior to Visit  Medication Sig Dispense Refill   calcium carbonate (TUMS EX) 750 MG chewable tablet Chew 1 tablet by mouth 3 (three) times daily as needed (indigestion/heartburn.).     Cholecalciferol (VITAMIN D3) 125 MCG (5000 UT) TABS Take 10,000 Units by mouth every other day.     diphenhydrAMINE (BENADRYL) 25 MG tablet Take 25 mg by mouth every 6 (six) hours as needed for sleep.     ferrous sulfate 325 (65 FE) MG EC tablet Take 1 tablet (325 mg total) by mouth daily with breakfast. 30 tablet 5   Glycerin-Hypromellose-PEG 400 (VISINE DRY EYE) 0.2-0.2-1 % SOLN Place 1 drop into both eyes 3 (three) times daily as needed (dry/irritated eyes.).     levocetirizine (XYZAL) 5 MG tablet Take 5 mg by mouth every evening.     loratadine (CLARITIN) 10 MG tablet Take 10 mg by mouth daily as needed for allergies.     Multiple Vitamin (MULTIVITAMIN WITH MINERALS) TABS Take 1 tablet by mouth daily. Centrum     Multiple Vitamins-Minerals (EMERGEN-C IMMUNE) PACK Take 1 Package by mouth at bedtime.     Omega-3 Fatty Acids (FISH OIL) 1200 MG CAPS Take 1,200 mg by mouth daily.     omeprazole (PRILOSEC) 20 MG capsule Take 20 mg by mouth daily.     oxyCODONE-acetaminophen (PERCOCET/ROXICET) 5-325 MG tablet Take 1-2 tablets by mouth every 4 (four)   hours as needed for severe pain. 60 tablet 0   Oxymetazoline HCl (SINEX LONG-ACTING NA) Place 1 spray into the nose 2 (two) times daily as needed (congestion).     Probiotic CAPS Take 1 capsule by mouth daily.     zinc gluconate 50 MG tablet Take 50 mg by mouth daily.     No current facility-administered medications on file prior to visit.    Allergies  Allergen Reactions   Penicillins Rash    Social History   Substance and Sexual Activity  Alcohol Use Yes   Alcohol/week: 1.0 standard drink of alcohol   Types: 1 Cans of  beer per week   Comment: socially    Social History   Tobacco Use  Smoking Status Former  Smokeless Tobacco Never    Review of Systems  Constitutional: Negative.   HENT:  Positive for sinus pain.   Eyes: Negative.   Respiratory: Negative.    Cardiovascular: Negative.   Genitourinary:  Positive for frequency.  Musculoskeletal:  Positive for back pain, joint pain and neck pain.  Skin: Negative.   Neurological: Negative.   Endo/Heme/Allergies: Negative.   Psychiatric/Behavioral: Negative.      Objective   Vitals:   02/22/23 1020  BP: 126/76  Pulse: 90  Resp: 14  Temp: 98.2 F (36.8 C)  SpO2: 96%    Physical Exam Vitals reviewed.  Constitutional:      Appearance: Normal appearance. He is normal weight. He is not ill-appearing.  HENT:     Head: Normocephalic and atraumatic.  Cardiovascular:     Rate and Rhythm: Normal rate and regular rhythm.     Heart sounds: Normal heart sounds. No murmur heard.    No friction rub. No gallop.  Pulmonary:     Effort: Pulmonary effort is normal. No respiratory distress.     Breath sounds: Normal breath sounds. No stridor. No wheezing, rhonchi or rales.  Abdominal:     General: Bowel sounds are normal. There is no distension.     Palpations: Abdomen is soft. There is no mass.     Tenderness: There is no abdominal tenderness. There is no guarding or rebound.     Hernia: No hernia is present.  Skin:    General: Skin is warm and dry.  Neurological:     Mental Status: He is alert and oriented to person, place, and time.   Drs. Katragadda and Carver's notes reviewed CT scan images personally reviewed.  Pathology reviewed Assessment  Cecal carcinoma, liver lesion. Plan  Patient is scheduled for robotic assisted laparoscopic partial colectomy on 03/09/2023 pending the results of the MRI.  The risks and benefits of the procedure including bleeding, infection, cardiopulmonary difficulties, the possibility of a blood transfusion, and  the addition of an open procedure were fully explained to the patient, who gave informed consent.  He is undergoing iron infusions which will be performed on 02/27/2023.  I will be seeing him back in my office after his infusion and to discuss the results of the MRI. 

## 2023-03-05 ENCOUNTER — Encounter (HOSPITAL_COMMUNITY): Payer: Self-pay | Admitting: Anesthesiology

## 2023-03-06 ENCOUNTER — Encounter (HOSPITAL_COMMUNITY)
Admission: RE | Admit: 2023-03-06 | Discharge: 2023-03-06 | Disposition: A | Discharge status: Home / Self Care | Payer: 59 | Source: Ambulatory Visit | Attending: General Surgery | Admitting: General Surgery

## 2023-03-06 ENCOUNTER — Other Ambulatory Visit: Payer: Self-pay | Admitting: *Deleted

## 2023-03-06 DIAGNOSIS — D509 Iron deficiency anemia, unspecified: Secondary | ICD-10-CM | POA: Insufficient documentation

## 2023-03-06 DIAGNOSIS — I10 Essential (primary) hypertension: Secondary | ICD-10-CM | POA: Diagnosis not present

## 2023-03-06 DIAGNOSIS — Z01818 Encounter for other preprocedural examination: Secondary | ICD-10-CM | POA: Insufficient documentation

## 2023-03-06 DIAGNOSIS — I4891 Unspecified atrial fibrillation: Secondary | ICD-10-CM

## 2023-03-06 DIAGNOSIS — D649 Anemia, unspecified: Secondary | ICD-10-CM | POA: Diagnosis not present

## 2023-03-06 DIAGNOSIS — C180 Malignant neoplasm of cecum: Secondary | ICD-10-CM

## 2023-03-06 NOTE — Pre-Procedure Instructions (Signed)
Patient in for pre-op and has a new onset of AFib. Dr Arnoldo Morale notified and will refer him to cardiology. Blair Hailey, OR scheduler notified as well.

## 2023-03-07 ENCOUNTER — Telehealth: Payer: Self-pay

## 2023-03-07 ENCOUNTER — Encounter: Payer: Self-pay | Admitting: *Deleted

## 2023-03-07 NOTE — Telephone Encounter (Signed)
   Name: Bradley Trujillo  DOB: 1951-10-04  MRN: [id number]  Primary Cardiologist: None  Chart reviewed as part of pre-operative protocol coverage. Because of [name] past medical history and time since last visit, he will require a follow-up in-office visit in order to better assess preoperative cardiovascular risk. Patient is scheduled as a new patient with Dr. Harl Bowie on 03/13/2023. I have updated appointment notes.   Pre-op covering staff:  - Please contact requesting surgeon's office via preferred method (i.e, phone, fax) to inform them of need for appointment prior to surgery.   Mayra Reel, NP  03/07/2023, 11:51 AM

## 2023-03-07 NOTE — Telephone Encounter (Signed)
..     Pre-operative Risk Assessment    Patient Name: Bradley Trujillo  DOB: 20-Mar-1951 MRN: [id number]      Request for Surgical Clearance    Procedure:   partial coloectomy  Date of Surgery:  Clearance TBD                                 Surgeon:  mark jenkins  Surgeon's Group or Practice Name:  [facility] Phone number:  [phone] Fax number:  [phone]   Type of Clearance Requested:   - Medical    Type of Anesthesia:  General    Additional requests/questions:    Gwenlyn Found   03/07/2023, 11:21 AM

## 2023-03-09 ENCOUNTER — Encounter (HOSPITAL_COMMUNITY): Admission: RE | Payer: Self-pay | Source: Home / Self Care

## 2023-03-09 ENCOUNTER — Inpatient Hospital Stay (HOSPITAL_COMMUNITY): Admission: RE | Admit: 2023-03-09 | Payer: Medicare HMO | Source: Home / Self Care | Admitting: General Surgery

## 2023-03-09 DIAGNOSIS — Z01818 Encounter for other preprocedural examination: Secondary | ICD-10-CM

## 2023-03-09 DIAGNOSIS — C180 Malignant neoplasm of cecum: Secondary | ICD-10-CM

## 2023-03-09 SURGERY — COLECTOMY, PARTIAL, ROBOT-ASSISTED, LAPAROSCOPIC
Anesthesia: General | Laterality: Right

## 2023-03-13 ENCOUNTER — Encounter: Payer: Self-pay | Admitting: Internal Medicine

## 2023-03-13 ENCOUNTER — Ambulatory Visit: Payer: 59 | Attending: Internal Medicine | Admitting: Internal Medicine

## 2023-03-13 VITALS — BP 100/72 | HR 83 | Ht 64.0 in | Wt 169.4 lb

## 2023-03-13 DIAGNOSIS — I480 Paroxysmal atrial fibrillation: Secondary | ICD-10-CM | POA: Diagnosis not present

## 2023-03-13 NOTE — Progress Notes (Addendum)
Cardiology Office Note:    Date:  03/13/2023   ID:  Bradley Trujillo, DOB 08-16-1951, MRN [id number]  PCP:  Adaline Sill, NP   [place] Providers Cardiologist:  Janina Mayo, MD     Referring MD: Adaline Sill, NP   No chief complaint on file. Pre-op  History of Present Illness:    Bradley Trujillo is a 72 y.o. male with a hx of arthritis, GERD, Guillain-barre, HTN, cecal adenocarcinoma, robotic assisted laparoscopic partial colectomy referral for pre-op, noted to be in afib. EKG 03/06/2023 shows rate controlled atrial fibrillation. Initially found to have signfiicant anemia hgb 6.4 on 02/07/2023. He received PRBC transfusion. He has received iron. Colonoscopy on 02/12/2023 + Cecal mass biopsy-adenocarcinoma. CT AP-Subtle hypodensity within the central segment 4 measuring 14 mm in the liver.   He was noted to be in afib when getting his transfusion. At pre-op afib was identified again.  He denies palpitations. He can tell if it is irregular. He notes this seems to have been long standing. Stool is dark. No BRBPR with no hx. Has leg and knee pain 2/2 Guillan Barre. Denies CP or SOB with his chores. He notes a prior stress test many years ago in the past for his job. That was normal. No cardiovascular dx hx otherwise.  Blood pressure is well controlled. No hx of OSA in the chart.   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 Quite smoking > 30 years ago  Family Hx: no premature CAD. No cardiac dx  Past Medical History:  Diagnosis Date   Arthritis    GERD (gastroesophageal reflux disease)    Guillain-Barre syndrome (HCC)    History of hiatal hernia    Hypertension    Seasonal allergies     Past Surgical History:  Procedure Laterality Date   ANTERIOR CERVICAL DECOMP/DISCECTOMY FUSION N/A 04/23/2013   Procedure: ANTERIOR CERVICAL DECOMPRESSION/DISCECTOMY FUSION 3 LEVELS;  Surgeon: Floyce Stakes, MD;  Location: [facility];   Service: Neurosurgery;  Laterality: N/A;  Cervical three-four, cervical four-five, cervical five-six Anterior cervical decompression/diskectomy/fusion   APPENDECTOMY     BIOPSY  02/12/2023   Procedure: BIOPSY;  Surgeon: Eloise Harman, DO;  Location: [facility];  Service: Endoscopy;;   CERVICAL FUSION     C7   COLONOSCOPY WITH PROPOFOL N/A 02/12/2023   Procedure: COLONOSCOPY WITH PROPOFOL;  Surgeon: Eloise Harman, DO;  Location: [facility];  Service: Endoscopy;  Laterality: N/A;  12:30pm, asa 3   ESOPHAGOGASTRODUODENOSCOPY (EGD) WITH PROPOFOL N/A 02/12/2023   Procedure: ESOPHAGOGASTRODUODENOSCOPY (EGD) WITH PROPOFOL;  Surgeon: Eloise Harman, DO;  Location: [facility];  Service: Endoscopy;  Laterality: N/A;   POLYPECTOMY  02/12/2023   Procedure: POLYPECTOMY INTESTINAL;  Surgeon: Eloise Harman, DO;  Location: [facility];  Service: Endoscopy;;    Current Medications: Current Meds  Medication Sig   acetaminophen (TYLENOL) 500 MG tablet Take 1,000 mg by mouth every 6 (six) hours as needed for moderate pain or mild pain.   calcium carbonate (TUMS EX) 750 MG chewable tablet Chew 1 tablet by mouth as needed (indigestion/heartburn.).   Cholecalciferol (VITAMIN D3) 125 MCG (5000 UT) TABS Take 5,000 Units by mouth every other day.   diphenhydrAMINE (BENADRYL) 25 MG tablet Take 25 mg by mouth every 6 (six) hours as needed for sleep.   ferrous sulfate 325 (65 FE) MG EC tablet Take 1 tablet (325 mg total) by mouth daily  with breakfast.   Glycerin-Hypromellose-PEG 400 (VISINE DRY EYE) 0.2-0.2-1 % SOLN Place 1 drop into both eyes 3 (three) times daily as needed (dry/irritated eyes.).   levocetirizine (XYZAL) 5 MG tablet Take 5 mg by mouth daily as needed for allergies.   loratadine (CLARITIN) 10 MG tablet Take 10 mg by mouth daily as needed for allergies.   metroNIDAZOLE (FLAGYL) 500 MG tablet Take 1 tablet (500 mg total) by mouth 3 (three) times daily. Take one tablet by mouth at  1pm, 2pm, and 9pm the day before surgery   Multiple Vitamin (MULTIVITAMIN WITH MINERALS) TABS Take 1 tablet by mouth daily. Centrum   neomycin (MYCIFRADIN) 500 MG tablet Take 2 tablets (1,000 mg total) by mouth 3 (three) times daily. Take two tablets by mouth at 1pm, 2pm, and 9pm the day before surgery.   Omega-3 Fatty Acids (FISH OIL) 1200 MG CAPS Take 1,200 mg by mouth daily.   omeprazole (PRILOSEC) 20 MG capsule Take 20 mg by mouth daily.   oxyCODONE-acetaminophen (PERCOCET/ROXICET) 5-325 MG tablet Take 1-2 tablets by mouth every 4 (four) hours as needed for severe pain. (Patient taking differently: Take 1 tablet by mouth every 8 (eight) hours as needed for severe pain.)   Oxymetazoline HCl (SINEX LONG-ACTING NA) Place 1 spray into the nose 2 (two) times daily as needed (congestion).   Probiotic CAPS Take 1 capsule by mouth daily.   zinc gluconate 50 MG tablet Take 50 mg by mouth daily.     Allergies:   Penicillins   Social History   Socioeconomic History   Marital status: Married    Spouse name: Not on file   Number of children: Not on file   Years of education: Not on file   Highest education level: Not on file  Occupational History   Not on file  Tobacco Use   Smoking status: Former   Smokeless tobacco: Never  Vaping Use   Vaping Use: Former  Substance and Sexual Activity   Alcohol use: Yes    Alcohol/week: 1.0 standard drink of alcohol    Types: 1 Cans of beer per week    Comment: socially   Drug use: No   Sexual activity: Not on file  Other Topics Concern   Not on file  Social History Narrative   Not on file   Social Determinants of Health   Financial Resource Strain: Not on file  Food Insecurity: No Food Insecurity (02/21/2023)   Hunger Vital Sign    Worried About Running Out of Food in the Last Year: Never true    Ran Out of Food in the Last Year: Never true  Transportation Needs: No Transportation Needs (02/21/2023)   PRAPARE - Radiographer, therapeutic (Medical): No    Lack of Transportation (Non-Medical): No  Physical Activity: Not on file  Stress: Not on file  Social Connections: Not on file     Family History: Per above  ROS:   Please see the history of present illness.     All other systems reviewed and are negative.  EKGs/Labs/Other Studies Reviewed:    The following studies were reviewed today:   EKG:  EKG is  ordered today.  The ekg ordered today demonstrates   EKG 03/13/2023- atrial fibrillation rate 83 bpm  Recent Labs: 02/09/2023: Magnesium 2.1 02/21/2023: ALT 13; BUN 16; Creatinine, Ser 1.06; Hemoglobin 10.0; Platelets 458; Potassium 4.3; Sodium 136  Recent Lipid Panel No results found for: "CHOL", "TRIG", "HDL", "CHOLHDL", "VLDL", "  LDLCALC", "LDLDIRECT"   Risk Assessment/Calculations:    CHA2DS2-VASc Score = 2   This indicates a 2.2% annual risk of stroke. The patient's score is based upon: CHF History: 0 HTN History: 1 Diabetes History: 0 Stroke History: 0 Vascular Disease History: 0 Age Score: 1 Gender Score: 0               Physical Exam:    VS:   Vitals:   03/13/23 1444  BP: 100/72  Pulse: 83  SpO2: 96%    Wt Readings from Last 3 Encounters:  03/13/23 169 lb 6.4 oz (76.8 kg)  03/01/23 171 lb (77.6 kg)  02/22/23 171 lb (77.6 kg)     GEN:  Well nourished, well developed in no acute distress HEENT: Normal NECK: No JVD; No carotid bruits LYMPHATICS: No lymphadenopathy CARDIAC: IRRR, no murmurs, rubs, gallops RESPIRATORY:  Clear to auscultation without rales, wheezing or rhonchi  ABDOMEN: Soft, non-tender, non-distended MUSCULOSKELETAL:  No edema; No deformity  SKIN: Warm and dry NEUROLOGIC:  Alert and oriented x 3 PSYCHIATRIC:  Normal affect   ASSESSMENT:    New Onset Paroxysmal Atrial Fibrillation: Diagnosed 03/06/2023. Chads2vasc =2. Recommend starting anticoagulation. As to not to delay surgery, he can start after his procedure. Will need to monitor CBC with hgb  6.3 last month. Rate controlled, no indication for rate control at this time. Will get TTE, TSH. Will start eliquis after his procedure.  Pre-Op: Had shared decision making conversation regarding his afib/surgical recommendations. He is in rate controlled afib which is not a contraindication for general anesthesia. Can give IV metop 5mg  with RVR during the procedure. Would not start anticoagulation at this moment considering his bleeding risk. Recommend he have his procedure and then can start eliquis 5 mg BID afterwards. Otherwise, he can do >4 mets (mainly upper body) without CP/SOB.  PLAN:    In order of problems listed above:  TSH TTE Acceptable cardiac risk for robotic assisted laparoscopic partial colectomy, see above Follow up 3 months       Medication Adjustments/Labs and Tests Ordered: Current medicines are reviewed at length with the patient today.  Concerns regarding medicines are outlined above.  Orders Placed This Encounter  Procedures   TSH   EKG 12-Lead   ECHOCARDIOGRAM COMPLETE   No orders of the defined types were placed in this encounter.   Patient Instructions  Medication Instructions:  Your physician recommends that you continue on your current medications as directed. Please refer to the Current Medication list given to you today.   *If you need a refill on your cardiac medications before your next appointment, please call your pharmacy*   Lab Work: Your provider has suggested for you to have lab work today.  TSH  If you have labs (blood work) drawn today and your tests are completely normal, you will receive your results only by: MyChart Message (if you have MyChart) OR A paper copy in the mail If you have any lab test that is abnormal or we need to change your treatment, we will call you to review the results.   Testing/Procedures: Your physician has requested that you have an echocardiogram. Echocardiography is a painless test that uses sound waves  to create images of your heart. It provides your doctor with information about the size and shape of your heart and how well your heart's chambers and valves are working. This procedure takes approximately one hour. There are no restrictions for this procedure. Please do NOT wear cologne,  perfume, aftershave, or lotions (deodorant is allowed). Please arrive 15 minutes prior to your appointment time.    Follow-Up: At [facility], you and your health needs are our priority.  As part of our continuing mission to provide you with exceptional heart care, we have created designated Provider Care Teams.  These Care Teams include your primary Cardiologist (physician) and Advanced Practice Providers (APPs -  Physician Assistants and Nurse Practitioners) who all work together to provide you with the care you need, when you need it.  We recommend signing up for the patient portal called "MyChart".  Sign up information is provided on this After Visit Summary.  MyChart is used to connect with patients for Virtual Visits (Telemedicine).  Patients are able to view lab/test results, encounter notes, upcoming appointments, etc.  Non-urgent messages can be sent to your provider as well.   To learn more about what you can do with MyChart, go to [email or web address].    Your next appointment:   3 month(s)  Provider:   Carolan Clines, MD   Other Instructions    Signed, [name], MD  03/13/2023 3:33 PM    [facility]

## 2023-03-13 NOTE — Patient Instructions (Signed)
Medication Instructions:  Your physician recommends that you continue on your current medications as directed. Please refer to the Current Medication list given to you today.   *If you need a refill on your cardiac medications before your next appointment, please call your pharmacy*   Lab Work: Your provider has suggested for you to have lab work today.  TSH  If you have labs (blood work) drawn today and your tests are completely normal, you will receive your results only by: [place] (if you have MyChart) OR A paper copy in the mail If you have any lab test that is abnormal or we need to change your treatment, we will call you to review the results.   Testing/Procedures: Your physician has requested that you have an echocardiogram. Echocardiography is a painless test that uses sound waves to create images of your heart. It provides your doctor with information about the size and shape of your heart and how well your heart's chambers and valves are working. This procedure takes approximately one hour. There are no restrictions for this procedure. Please do NOT wear cologne, perfume, aftershave, or lotions (deodorant is allowed). Please arrive 15 minutes prior to your appointment time.    Follow-Up: At [facility], you and your health needs are our priority.  As part of our continuing mission to provide you with exceptional heart care, we have created designated Provider Care Teams.  These Care Teams include your primary Cardiologist (physician) and Advanced Practice Providers (APPs -  Physician Assistants and Nurse Practitioners) who all work together to provide you with the care you need, when you need it.  We recommend signing up for the patient portal called "MyChart".  Sign up information is provided on this After Visit Summary.  MyChart is used to connect with patients for Virtual Visits (Telemedicine).  Patients are able to view lab/test results, encounter notes,  upcoming appointments, etc.  Non-urgent messages can be sent to your provider as well.   To learn more about what you can do with MyChart, go to [email or web address].    Your next appointment:   3 month(s)  Provider:   Phineas Inches, MD   Other Instructions

## 2023-03-14 ENCOUNTER — Encounter: Payer: Self-pay | Admitting: Internal Medicine

## 2023-03-14 LAB — TSH: TSH: 1.49 u[IU]/mL (ref 0.450–4.500)

## 2023-04-04 ENCOUNTER — Ambulatory Visit: Payer: Medicare HMO | Admitting: Hematology

## 2023-04-06 ENCOUNTER — Telehealth: Payer: Self-pay | Admitting: Internal Medicine

## 2023-04-06 NOTE — Telephone Encounter (Signed)
[name], MD  You21 minutes ago (10:41 AM)   MB He is acceptable cardiac risk for the procedure. TTE as part of his care.   Returned call to Zella Ball (okay per DPR) advised her of the above. This has been routed to surgeon office as well as OV note. Advised her to call back with any issues or concerns, Robin verbalized understanding.

## 2023-04-06 NOTE — Telephone Encounter (Signed)
Returned call to Zella Ball (okay per DPR) advised her of the below from Dr. Verna Czech last OV note. Zella Ball states that they have not heard from the surgeon and wanted to make sure that patient didn't need something else from cardiology. Advised her to keep appointment for Echo, and that I would forward message to Dr. Wyline Mood to ensure nothing is needed. Robin verbalized understanding.   Per Dr Verna Czech last OV note New Onset Paroxysmal Atrial Fibrillation: Diagnosed 03/06/2023. Chads2vasc =2. Recommend starting anticoagulation. As to not to delay surgery, he can start after his procedure. Will need to monitor CBC with hgb 6.3 last month. Rate controlled, no indication for rate control at this time. Will get TTE, TSH. Will start eliquis after his procedure.   Pre-Op: Had shared decision making conversation regarding his afib/surgical recommendations. He is in rate controlled afib which is not a contraindication for general anesthesia. Would recommend a cardiac anesthesiologist if feasible. Can give IV metop  with RVR during the procedure. Would not start anticoagulation at this moment considering his bleeding risk. Recommend he have his procedure and then can start eliquis 5 mg BID afterwards. Otherwise, he can do >4 mets (mainly upper body) without CP/SOB.

## 2023-04-06 NOTE — Telephone Encounter (Signed)
  Pt's wife calling, they would like to confirm if the echo he is scheduled for on Thursday is part of his clearance to get his surgery. She said, they were not told what is the echo for

## 2023-04-12 ENCOUNTER — Ambulatory Visit (HOSPITAL_COMMUNITY)
Admission: RE | Admit: 2023-04-12 | Discharge: 2023-04-12 | Disposition: A | Discharge status: Home / Self Care | Payer: 59 | Source: Ambulatory Visit | Attending: Internal Medicine | Admitting: Internal Medicine

## 2023-04-12 DIAGNOSIS — I480 Paroxysmal atrial fibrillation: Secondary | ICD-10-CM | POA: Diagnosis not present

## 2023-04-12 LAB — ECHOCARDIOGRAM COMPLETE
Area-P 1/2: 3.72 cm2
S' Lateral: 1.8 cm

## 2023-04-12 NOTE — Progress Notes (Signed)
*  PRELIMINARY RESULTS* Echocardiogram 2D Echocardiogram has been performed.  Stacey Drain 04/12/2023, 11:12 AM

## 2023-04-18 ENCOUNTER — Telehealth: Payer: Self-pay | Admitting: Internal Medicine

## 2023-04-18 NOTE — Telephone Encounter (Signed)
Spoke with Dr. Lovell Sheehan he states that  Dr Wyline Mood has put in notes for cardiac anesthesiologist . They don't have one.  He does state that it looks like Dr Wyline Mood wrote the note before the echo was done and this resulted and was normal.  He ask can she take out of the recommendation for the cardiac anesthesiologist , after review of echo, if she is comfortable with regular anesthesiologist.  If not will refer to [facility] for cardiac anesthesiologist

## 2023-04-18 NOTE — Telephone Encounter (Signed)
Dr. [name] called in asking  for Dr. Wyline Mood to give him a c/b about some of her recommendations.  Best c/b - [phone]

## 2023-04-19 NOTE — Telephone Encounter (Signed)
Bradley Trujillo with Dr. York Ram office is following up, requesting that Dr. Wyline Mood contact Dr. Lovell Sheehan ASAP regarding patient's clearance.   Same c/b # (Dr. Lovell Sheehan' cell): [phone]

## 2023-04-19 NOTE — Telephone Encounter (Signed)
[name], MD  Cv Div Nl Triage; Bullins, Cleotilde Neer, RN 4 hours ago (10:49 AM) removed MB 04/19/2023-4 hours ago (10:49 AM) -------------------------------------- Spoke with Dr Lovell Sheehan he states that he would like this not forwarded to him....done

## 2023-04-24 ENCOUNTER — Encounter (HOSPITAL_COMMUNITY): Payer: Self-pay

## 2023-04-24 ENCOUNTER — Other Ambulatory Visit: Payer: Self-pay

## 2023-04-24 ENCOUNTER — Encounter (HOSPITAL_COMMUNITY)
Admission: RE | Admit: 2023-04-24 | Discharge: 2023-04-24 | Disposition: A | Discharge status: Home / Self Care | Payer: 59 | Source: Ambulatory Visit | Attending: General Surgery | Admitting: General Surgery

## 2023-04-24 DIAGNOSIS — Z01818 Encounter for other preprocedural examination: Secondary | ICD-10-CM

## 2023-04-24 DIAGNOSIS — Z01812 Encounter for preprocedural laboratory examination: Secondary | ICD-10-CM | POA: Insufficient documentation

## 2023-04-24 LAB — CBC WITH DIFFERENTIAL/PLATELET
Abs Immature Granulocytes: 0.02 10*3/uL (ref 0.00–0.07)
Basophils Absolute: 0.1 10*3/uL (ref 0.0–0.1)
Basophils Relative: 2 %
Eosinophils Absolute: 0.1 10*3/uL (ref 0.0–0.5)
Eosinophils Relative: 1 %
HCT: 46.1 % (ref 39.0–52.0)
Hemoglobin: 14 g/dL (ref 13.0–17.0)
Immature Granulocytes: 0 %
Lymphocytes Relative: 18 %
Lymphs Abs: 1.1 10*3/uL (ref 0.7–4.0)
MCH: 25.4 pg — ABNORMAL LOW (ref 26.0–34.0)
MCHC: 30.4 g/dL (ref 30.0–36.0)
MCV: 83.7 fL (ref 80.0–100.0)
Monocytes Absolute: 0.4 10*3/uL (ref 0.1–1.0)
Monocytes Relative: 6 %
Neutro Abs: 4.5 10*3/uL (ref 1.7–7.7)
Neutrophils Relative %: 73 %
Platelets: 247 10*3/uL (ref 150–400)
RBC: 5.51 MIL/uL (ref 4.22–5.81)
RDW: 23.1 % — ABNORMAL HIGH (ref 11.5–15.5)
WBC: 6.1 10*3/uL (ref 4.0–10.5)
nRBC: 0 % (ref 0.0–0.2)

## 2023-04-24 LAB — COMPREHENSIVE METABOLIC PANEL
ALT: 17 U/L (ref 0–44)
AST: 20 U/L (ref 15–41)
Albumin: 3.8 g/dL (ref 3.5–5.0)
Alkaline Phosphatase: 76 U/L (ref 38–126)
Anion gap: 9 (ref 5–15)
BUN: 13 mg/dL (ref 8–23)
CO2: 25 mmol/L (ref 22–32)
Calcium: 9.2 mg/dL (ref 8.9–10.3)
Chloride: 103 mmol/L (ref 98–111)
Creatinine, Ser: 1.1 mg/dL (ref 0.61–1.24)
GFR, Estimated: >60 mL/min (ref >60)
Glucose, Bld: 91 mg/dL (ref 70–99)
Potassium: 3.7 mmol/L (ref 3.5–5.1)
Sodium: 137 mmol/L (ref 135–145)
Total Bilirubin: 0.6 mg/dL (ref 0.3–1.2)
Total Protein: 6.8 g/dL (ref 6.5–8.1)

## 2023-04-24 LAB — TYPE AND SCREEN
ABO/RH(D): O POS
Antibody Screen: NEGATIVE

## 2023-04-24 NOTE — Patient Instructions (Signed)
Your procedure is scheduled on: 04/27/2023  Report to [facility] Main Entrance at   10:00  AM.  Call this number if you have problems the morning of surgery: [phone]   Remember:              Follow instructions from Dr Lovell Sheehan office reguarding bowel prep              Take the  2 Antibiotics (Metronidazole/Flagyl and Neomycin) the day before your surgery as directed on bottle at 1 pm, 2 pm, and 9 pm   Do not Eat or Drink after midnight         No Smoking the morning of surgery  :  Take these medicines the morning of surgery with A SIP OF WATER: omeprazole and claritin   Do not wear jewelry, make-up or nail polish.  Do not wear lotions, powders, or perfumes. You may wear deodorant.  Do not shave 48 hours prior to surgery. Men may shave face and neck.  Do not bring valuables to the hospital.  Contacts, dentures or bridgework may not be worn into surgery.  Leave suitcase in the car. After surgery it may be brought to your room.  For patients admitted to the hospital, checkout time is 11:00 AM the day of discharge.   Patients discharged the day of surgery will not be allowed to drive home.    Special Instructions: Shower using CHG night before surgery and shower the day of surgery use CHG.  Use special wash - you have one bottle of CHG for all showers.  You should use approximately 1/2 of the bottle for each shower. How to Use Chlorhexidine Before Surgery Chlorhexidine gluconate (CHG) is a germ-killing (antiseptic) solution that is used to clean the skin. It can get rid of the bacteria that normally live on the skin and can keep them away for about 24 hours. To clean your skin with CHG, you may be given: A CHG solution to use in the shower or as part of a sponge bath. A prepackaged cloth that contains CHG. Cleaning your skin with CHG may help lower the risk for infection: While you are staying in the intensive care unit of the hospital. If you have a vascular access, such as a  central line, to provide short-term or long-term access to your veins. If you have a catheter to drain urine from your bladder. If you are on a ventilator. A ventilator is a machine that helps you breathe by moving air in and out of your lungs. After surgery. What are the risks? Risks of using CHG include: A skin reaction. Hearing loss, if CHG gets in your ears and you have a perforated eardrum. Eye injury, if CHG gets in your eyes and is not rinsed out. The CHG product catching fire. Make sure that you avoid smoking and flames after applying CHG to your skin. Do not use CHG: If you have a chlorhexidine allergy or have previously reacted to chlorhexidine. On babies younger than 89 months of age. How to use CHG solution Use CHG only as told by your health care provider, and follow the instructions on the label. Use the full amount of CHG as directed. Usually, this is one bottle. During a shower Follow these steps when using CHG solution during a shower (unless your health care provider gives you different instructions): Start the shower. Use your normal soap and shampoo to wash your face and hair. Turn off the shower or move  out of the shower stream. Pour the CHG onto a clean washcloth. Do not use any type of brush or rough-edged sponge. Starting at your neck, lather your body down to your toes. Make sure you follow these instructions: If you will be having surgery, pay special attention to the part of your body where you will be having surgery. Scrub this area for at least 1 minute. Do not use CHG on your head or face. If the solution gets into your ears or eyes, rinse them well with water. Avoid your genital area. Avoid any areas of skin that have broken skin, cuts, or scrapes. Scrub your back and under your arms. Make sure to wash skin folds. Let the lather sit on your skin for 1-2 minutes or as long as told by your health care provider. Thoroughly rinse your entire body in the shower.  Make sure that all body creases and crevices are rinsed well. Dry off with a clean towel. Do not put any substances on your body afterward--such as powder, lotion, or perfume--unless you are told to do so by your health care provider. Only use lotions that are recommended by the manufacturer. Put on clean clothes or pajamas. If it is the night before your surgery, sleep in clean sheets.  During a sponge bath Follow these steps when using CHG solution during a sponge bath (unless your health care provider gives you different instructions): Use your normal soap and shampoo to wash your face and hair. Pour the CHG onto a clean washcloth. Starting at your neck, lather your body down to your toes. Make sure you follow these instructions: If you will be having surgery, pay special attention to the part of your body where you will be having surgery. Scrub this area for at least 1 minute. Do not use CHG on your head or face. If the solution gets into your ears or eyes, rinse them well with water. Avoid your genital area. Avoid any areas of skin that have broken skin, cuts, or scrapes. Scrub your back and under your arms. Make sure to wash skin folds. Let the lather sit on your skin for 1-2 minutes or as long as told by your health care provider. Using a different clean, wet washcloth, thoroughly rinse your entire body. Make sure that all body creases and crevices are rinsed well. Dry off with a clean towel. Do not put any substances on your body afterward--such as powder, lotion, or perfume--unless you are told to do so by your health care provider. Only use lotions that are recommended by the manufacturer. Put on clean clothes or pajamas. If it is the night before your surgery, sleep in clean sheets. How to use CHG prepackaged cloths Only use CHG cloths as told by your health care provider, and follow the instructions on the label. Use the CHG cloth on clean, dry skin. Do not use the CHG cloth on  your head or face unless your health care provider tells you to. When washing with the CHG cloth: Avoid your genital area. Avoid any areas of skin that have broken skin, cuts, or scrapes. Before surgery Follow these steps when using a CHG cloth to clean before surgery (unless your health care provider gives you different instructions): Using the CHG cloth, vigorously scrub the part of your body where you will be having surgery. Scrub using a back-and-forth motion for 3 minutes. The area on your body should be completely wet with CHG when you are done scrubbing. Do not  rinse. Discard the cloth and let the area air-dry. Do not put any substances on the area afterward, such as powder, lotion, or perfume. Put on clean clothes or pajamas. If it is the night before your surgery, sleep in clean sheets.  For general bathing Follow these steps when using CHG cloths for general bathing (unless your health care provider gives you different instructions). Use a separate CHG cloth for each area of your body. Make sure you wash between any folds of skin and between your fingers and toes. Wash your body in the following order, switching to a new cloth after each step: The front of your neck, shoulders, and chest. Both of your arms, under your arms, and your hands. Your stomach and groin area, avoiding the genitals. Your right leg and foot. Your left leg and foot. The back of your neck, your back, and your buttocks. Do not rinse. Discard the cloth and let the area air-dry. Do not put any substances on your body afterward--such as powder, lotion, or perfume--unless you are told to do so by your health care provider. Only use lotions that are recommended by the manufacturer. Put on clean clothes or pajamas. Contact a health care provider if: Your skin gets irritated after scrubbing. You have questions about using your solution or cloth. You swallow any chlorhexidine. Call your local poison control center  ([phone] in the [place] Get help right away if: Your eyes itch badly, or they become very red or swollen. Your skin itches badly and is red or swollen. Your hearing changes. You have trouble seeing. You have swelling or tingling in your mouth or throat. You have trouble breathing. These symptoms may represent a serious problem that is an emergency. Do not wait to see if the symptoms will go away. Get medical help right away. Call your local emergency services (911 in the [place] Do not drive yourself to the hospital. Summary Chlorhexidine gluconate (CHG) is a germ-killing (antiseptic) solution that is used to clean the skin. Cleaning your skin with CHG may help to lower your risk for infection. You may be given CHG to use for bathing. It may be in a bottle or in a prepackaged cloth to use on your skin. Carefully follow your health care provider's instructions and the instructions on the product label. Do not use CHG if you have a chlorhexidine allergy. Contact your health care provider if your skin gets irritated after scrubbing. This information is not intended to replace advice given to you by your health care provider. Make sure you discuss any questions you have with your health care provider. Document Revised: 04/03/2022 Document Reviewed: 02/14/2021 Elsevier Patient Education  2023 [employer] Minimally Invasive Partial Colectomy, Adult, Care After The following information offers guidance on how to care for yourself after your procedure. Your health care provider may also give you more specific instructions. If you have problems or questions, contact your health care provider. What can I expect after the surgery? After the procedure, it is common to have: Pain, bruising, and swelling. Bloating. Weakness and tiredness (fatigue). Changes to your bowel movements, especially having bowel movements more often. Follow these instructions at home: Medicines Take over-the-counter and  prescription medicines only as told by your health care provider. If you were prescribed an antibiotic medicine, take it as told by your health care provider. Do not stop using the antibiotic even if you start to feel better. Ask your health care provider if the medicine prescribed to you: Requires you  to avoid driving or using machinery. Can cause constipation. You may need to take these actions to prevent or treat constipation: Drink enough fluids to keep your urine pale yellow. Take over-the-counter or prescription medicines. Limit foods that are high in fat and processed sugars, such as fried or sweet foods. Eating and drinking Follow instructions from your health care provider about what you may eat and drink. Do not drink alcohol if your health care provider tells you not to drink. Eat a low-fiber diet for the first 4 weeks after surgery or as told by your health care provider.. Most people on a low-fiber eating plan should eat less than 10 grams (g) of fiber a day. Follow recommendations from your health care provider or dietitian about how much fiber you should have each day. Always check food labels to know the fiber content of packaged foods. In general, a low-fiber food will have fewer than 2 g of fiber per serving. In general, try to avoid whole grains, raw fruits and vegetables, dried fruit, tough cuts of meat, nuts, and seeds. Incision care  Follow instructions from your health care provider about how to take care of your incisions. Make sure you: Wash your hands with soap and water for at least 20 seconds before and after you change your bandage (dressing). If soap and water are not available, use hand sanitizer. Change your dressing as told by your health care provider. Leave stitches (sutures), skin glue, or adhesive strips in place. These skin closures may need to stay in place for 2 weeks or longer. If adhesive strip edges start to loosen and curl up, you may trim the loose  edges. Do not remove adhesive strips completely unless your health care provider tells you to do that. Check your incision area every day for signs of infection. Check for: More redness, swelling, or pain. Fluid or blood. Warmth. Pus or a bad smell. Activity Rest as told by your health care provider. Avoid sitting for a long time without moving. Get up to take short walks every 1-2 hours. This is important to improve blood flow and breathing. Ask for help if you feel weak or unsteady. You may have to avoid lifting. Ask your health care provider how much you can safely lift. Return to your normal activities as told by your health care provider. Ask your health care provider what activities are safe for you. General instructions Do not use any products that contain nicotine or tobacco. These products include cigarettes, chewing tobacco, and vaping devices, such as e-cigarettes. If you need help quitting, ask your health care provider. Do not take baths, swim, or use a hot tub until your health care provider approves. Ask your health care provider if you may take showers. You may only be allowed to take sponge baths. Wear compression stockings as told by your health care provider. These stockings help to prevent blood clots and reduce swelling in your legs. Keep all follow-up visits. This is important to monitor healing and check for any complications. Contact a health care provider if: Medicine is not controlling your pain. You have chills or fever. You have any signs of infection in your incision areas. You have a persistent cough. You have nausea or vomiting. You develop a rash. You have not had a bowel movement in 3 days. Get help right away if: You have severe pain. Your incisions break open after sutures or staples have been removed. You are bleeding from your rectum or have blood in  your stool. You have a warm, tender swelling in your leg. You have chest pain or trouble  breathing. You have increased swelling in the abdomen. You feel light-headed or you faint. These symptoms may be an emergency. Get help right away. Call 911. Do not wait to see if the symptoms will go away. Do not drive yourself to the hospital. Summary After surgery, it is common to have some pain, bruising, swelling, bloating, tiredness, weakness, or changes to your bowel movements. Follow instructions from your health care provider about what to eat and drink. Return to your normal activities as told by your health care provider. Check your incision area every day for signs of infection. Get help right away if you have chest pain or trouble breathing. This information is not intended to replace advice given to you by your health care provider. Make sure you discuss any questions you have with your health care provider. Document Revised: 03/22/2022 Document Reviewed: 03/22/2022 Elsevier Patient Education  2023 [employer] Inc. General Anesthesia, Adult, Care After The following information offers guidance on how to care for yourself after your procedure. Your health care provider may also give you more specific instructions. If you have problems or questions, contact your health care provider. What can I expect after the procedure? After the procedure, it is common for people to: Have pain or discomfort at the IV site. Have nausea or vomiting. Have a sore throat or hoarseness. Have trouble concentrating. Feel cold or chills. Feel weak, sleepy, or tired (fatigue). Have soreness and body aches. These can affect parts of the body that were not involved in surgery. Follow these instructions at home: For the time period you were told by your health care provider:  Rest. Do not participate in activities where you could fall or become injured. Do not drive or use machinery. Do not drink alcohol. Do not take sleeping pills or medicines that cause drowsiness. Do not make important decisions  or sign legal documents. Do not take care of children on your own. General instructions Drink enough fluid to keep your urine pale yellow. If you have sleep apnea, surgery and certain medicines can increase your risk for breathing problems. Follow instructions from your health care provider about wearing your sleep device: Anytime you are sleeping, including during daytime naps. While taking prescription pain medicines, sleeping medicines, or medicines that make you drowsy. Return to your normal activities as told by your health care provider. Ask your health care provider what activities are safe for you. Take over-the-counter and prescription medicines only as told by your health care provider. Do not use any products that contain nicotine or tobacco. These products include cigarettes, chewing tobacco, and vaping devices, such as e-cigarettes. These can delay incision healing after surgery. If you need help quitting, ask your health care provider. Contact a health care provider if: You have nausea or vomiting that does not get better with medicine. You vomit every time you eat or drink. You have pain that does not get better with medicine. You cannot urinate or have bloody urine. You develop a skin rash. You have a fever. Get help right away if: You have trouble breathing. You have chest pain. You vomit blood. These symptoms may be an emergency. Get help right away. Call 911. Do not wait to see if the symptoms will go away. Do not drive yourself to the hospital. Summary After the procedure, it is common to have a sore throat, hoarseness, nausea, vomiting, or to feel weak,  sleepy, or fatigue. For the time period you were told by your health care provider, do not drive or use machinery. Get help right away if you have difficulty breathing, have chest pain, or vomit blood. These symptoms may be an emergency. This information is not intended to replace advice given to you by your health  care provider. Make sure you discuss any questions you have with your health care provider. Document Revised: 03/03/2022 Document Reviewed: 03/03/2022 Elsevier Patient Education  2023 [employer].

## 2023-04-24 NOTE — H&P (Signed)
Bradley Trujillo; [id number]; July 26, 1951   HPI Patient is a 72 year old white male who was referred to my care by Dr. 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  He was found on recent colonoscopy for anemia to have a colon cancer.  It was placed in the cecum.  Patient denies any abdominal pain, nausea, or vomiting.  He has not noticed any blood in his stools.  There is no family history of colon cancer.  He has been seen by Dr. Delton Coombes of oncology and is undergoing an MRI of his liver due to a small lesion in segment 4 of the liver.  This is on 02/25/2023.  Patient's bowel movements have been within normal limits. Past Medical History:  Diagnosis Date   Arthritis    GERD (gastroesophageal reflux disease)    Guillain-Barre syndrome (HCC)    History of hiatal hernia    Hypertension    Seasonal allergies     Past Surgical History:  Procedure Laterality Date   ANTERIOR CERVICAL DECOMP/DISCECTOMY FUSION N/A 04/23/2013   Procedure: ANTERIOR CERVICAL DECOMPRESSION/DISCECTOMY FUSION 3 LEVELS;  Surgeon: Floyce Stakes, MD;  Location: [facility];  Service: Neurosurgery;  Laterality: N/A;  Cervical three-four, cervical four-five, cervical five-six Anterior cervical decompression/diskectomy/fusion   APPENDECTOMY     BIOPSY  02/12/2023   Procedure: BIOPSY;  Surgeon: Eloise Harman, DO;  Location: [facility];  Service: Endoscopy;;   CERVICAL FUSION     C7   COLONOSCOPY WITH PROPOFOL N/A 02/12/2023   Procedure: COLONOSCOPY WITH PROPOFOL;  Surgeon: Eloise Harman, DO;  Location: [facility];  Service: Endoscopy;  Laterality: N/A;  12:30pm, asa 3   ESOPHAGOGASTRODUODENOSCOPY (EGD) WITH PROPOFOL N/A 02/12/2023   Procedure: ESOPHAGOGASTRODUODENOSCOPY (EGD) WITH PROPOFOL;  Surgeon: Eloise Harman, DO;  Location: [facility];  Service: Endoscopy;  Laterality: N/A;   POLYPECTOMY  02/12/2023   Procedure: POLYPECTOMY INTESTINAL;  Surgeon: Eloise Harman, DO;   Location: [facility];  Service: Endoscopy;;    History reviewed. No pertinent family history.  Current Outpatient Medications on File Prior to Visit  Medication Sig Dispense Refill   calcium carbonate (TUMS EX) 750 MG chewable tablet Chew 1 tablet by mouth 3 (three) times daily as needed (indigestion/heartburn.).     Cholecalciferol (VITAMIN D3) 125 MCG (5000 UT) TABS Take 10,000 Units by mouth every other day.     diphenhydrAMINE (BENADRYL) 25 MG tablet Take 25 mg by mouth every 6 (six) hours as needed for sleep.     ferrous sulfate 325 (65 FE) MG EC tablet Take 1 tablet (325 mg total) by mouth daily with breakfast. 30 tablet 5   Glycerin-Hypromellose-PEG 400 (VISINE DRY EYE) 0.2-0.2-1 % SOLN Place 1 drop into both eyes 3 (three) times daily as needed (dry/irritated eyes.).     levocetirizine (XYZAL) 5 MG tablet Take 5 mg by mouth every evening.     loratadine (CLARITIN) 10 MG tablet Take 10 mg by mouth daily as needed for allergies.     Multiple Vitamin (MULTIVITAMIN WITH MINERALS) TABS Take 1 tablet by mouth daily. Centrum     Multiple Vitamins-Minerals (EMERGEN-C IMMUNE) PACK Take 1 Package by mouth at bedtime.     Omega-3 Fatty Acids (FISH OIL) 1200 MG CAPS Take 1,200 mg by mouth daily.     omeprazole (PRILOSEC) 20 MG capsule Take 20 mg by mouth daily.     oxyCODONE-acetaminophen (PERCOCET/ROXICET) 5-325 MG tablet Take 1-2 tablets by mouth every 4 (four)  hours as needed for severe pain. 60 tablet 0   Oxymetazoline HCl (SINEX LONG-ACTING NA) Place 1 spray into the nose 2 (two) times daily as needed (congestion).     Probiotic CAPS Take 1 capsule by mouth daily.     zinc gluconate 50 MG tablet Take 50 mg by mouth daily.     No current facility-administered medications on file prior to visit.    Allergies  Allergen Reactions   Penicillins Rash    Social History   Substance and Sexual Activity  Alcohol Use Yes   Alcohol/week: 1.0 standard drink of alcohol   Types: 1 Cans of  beer per week   Comment: socially    Social History   Tobacco Use  Smoking Status Former  Smokeless Tobacco Never    Review of Systems  Constitutional: Negative.   HENT:  Positive for sinus pain.   Eyes: Negative.   Respiratory: Negative.    Cardiovascular: Negative.   Genitourinary:  Positive for frequency.  Musculoskeletal:  Positive for back pain, joint pain and neck pain.  Skin: Negative.   Neurological: Negative.   Endo/Heme/Allergies: Negative.   Psychiatric/Behavioral: Negative.      Objective   Vitals:   02/22/23 1020  BP: 126/76  Pulse: 90  Resp: 14  Temp: 98.2 F (36.8 C)  SpO2: 96%    Physical Exam Vitals reviewed.  Constitutional:      Appearance: Normal appearance. He is normal weight. He is not ill-appearing.  HENT:     Head: Normocephalic and atraumatic.  Cardiovascular:     Rate and Rhythm: Normal rate and regular rhythm.     Heart sounds: Normal heart sounds. No murmur heard.    No friction rub. No gallop.  Pulmonary:     Effort: Pulmonary effort is normal. No respiratory distress.     Breath sounds: Normal breath sounds. No stridor. No wheezing, rhonchi or rales.  Abdominal:     General: Bowel sounds are normal. There is no distension.     Palpations: Abdomen is soft. There is no mass.     Tenderness: There is no abdominal tenderness. There is no guarding or rebound.     Hernia: No hernia is present.  Skin:    General: Skin is warm and dry.  Neurological:     Mental Status: He is alert and oriented to person, place, and time.   Drs. Ellin Saba and Carver's notes reviewed CT scan images personally reviewed.  Pathology reviewed Assessment  Cecal carcinoma, liver lesion. Plan  Patient is scheduled for robotic assisted laparoscopic partial colectomy on 03/09/2023 pending the results of the MRI.  The risks and benefits of the procedure including bleeding, infection, cardiopulmonary difficulties, the possibility of a blood transfusion, and  the addition of an open procedure were fully explained to the patient, who gave informed consent.  He is undergoing iron infusions which will be performed on 02/27/2023.  I will be seeing him back in my office after his infusion and to discuss the results of the MRI.  Addendum: Patient had an MRI which was negative for metastatic disease.  He has been seen by cardiology for workup of new onset atrial fibrillation.  He has been cleared by cardiology for surgical intervention.

## 2023-04-25 ENCOUNTER — Encounter: Payer: Self-pay | Admitting: *Deleted

## 2023-04-26 ENCOUNTER — Other Ambulatory Visit (HOSPITAL_COMMUNITY): Payer: 59

## 2023-04-27 ENCOUNTER — Inpatient Hospital Stay (HOSPITAL_COMMUNITY): Payer: 59 | Admitting: Certified Registered Nurse Anesthetist

## 2023-04-27 ENCOUNTER — Encounter (HOSPITAL_COMMUNITY): Payer: Self-pay | Admitting: General Surgery

## 2023-04-27 ENCOUNTER — Encounter (HOSPITAL_COMMUNITY)
Admission: RE | Disposition: A | Discharge status: Home / Self Care | Payer: Self-pay | Source: Home / Self Care | Attending: General Surgery

## 2023-04-27 ENCOUNTER — Other Ambulatory Visit: Payer: Self-pay

## 2023-04-27 ENCOUNTER — Inpatient Hospital Stay (HOSPITAL_COMMUNITY)
Admission: RE | Admit: 2023-04-27 | Discharge: 2023-04-30 | DRG: 330 | Disposition: A | Discharge status: Home / Self Care | Payer: 59 | Attending: General Surgery | Admitting: General Surgery

## 2023-04-27 DIAGNOSIS — G610 Guillain-Barre syndrome: Secondary | ICD-10-CM | POA: Diagnosis present

## 2023-04-27 DIAGNOSIS — Z85038 Personal history of other malignant neoplasm of large intestine: Secondary | ICD-10-CM | POA: Diagnosis not present

## 2023-04-27 DIAGNOSIS — C180 Malignant neoplasm of cecum: Secondary | ICD-10-CM | POA: Diagnosis not present

## 2023-04-27 DIAGNOSIS — Z8719 Personal history of other diseases of the digestive system: Secondary | ICD-10-CM | POA: Diagnosis not present

## 2023-04-27 DIAGNOSIS — J302 Other seasonal allergic rhinitis: Secondary | ICD-10-CM | POA: Diagnosis present

## 2023-04-27 DIAGNOSIS — K660 Peritoneal adhesions (postprocedural) (postinfection): Secondary | ICD-10-CM | POA: Diagnosis present

## 2023-04-27 DIAGNOSIS — I480 Paroxysmal atrial fibrillation: Secondary | ICD-10-CM | POA: Diagnosis present

## 2023-04-27 DIAGNOSIS — I10 Essential (primary) hypertension: Secondary | ICD-10-CM

## 2023-04-27 DIAGNOSIS — D630 Anemia in neoplastic disease: Secondary | ICD-10-CM | POA: Diagnosis not present

## 2023-04-27 DIAGNOSIS — Z79899 Other long term (current) drug therapy: Secondary | ICD-10-CM | POA: Diagnosis not present

## 2023-04-27 DIAGNOSIS — K769 Liver disease, unspecified: Secondary | ICD-10-CM | POA: Diagnosis present

## 2023-04-27 DIAGNOSIS — Z9049 Acquired absence of other specified parts of digestive tract: Secondary | ICD-10-CM | POA: Diagnosis present

## 2023-04-27 DIAGNOSIS — Z880 Allergy status to penicillin: Secondary | ICD-10-CM

## 2023-04-27 DIAGNOSIS — C182 Malignant neoplasm of ascending colon: Secondary | ICD-10-CM | POA: Diagnosis present

## 2023-04-27 DIAGNOSIS — Z01818 Encounter for other preprocedural examination: Secondary | ICD-10-CM

## 2023-04-27 DIAGNOSIS — Z87891 Personal history of nicotine dependence: Secondary | ICD-10-CM

## 2023-04-27 DIAGNOSIS — K219 Gastro-esophageal reflux disease without esophagitis: Secondary | ICD-10-CM | POA: Diagnosis present

## 2023-04-27 DIAGNOSIS — Z981 Arthrodesis status: Secondary | ICD-10-CM

## 2023-04-27 HISTORY — DX: Cardiac arrhythmia, unspecified: I49.9

## 2023-04-27 LAB — TYPE AND SCREEN
ABO/RH(D): O POS
Antibody Screen: NEGATIVE

## 2023-04-27 SURGERY — COLECTOMY, PARTIAL, ROBOT-ASSISTED, LAPAROSCOPIC
Anesthesia: General | Site: Abdomen

## 2023-04-27 MED ORDER — ONDANSETRON HCL 4 MG/2ML IJ SOLN: 4.0000 mg | Freq: Four times a day (QID) | INTRAMUSCULAR | Status: DC | PRN

## 2023-04-27 MED ORDER — 0.9 % SODIUM CHLORIDE (POUR BTL) OPTIME
TOPICAL | Status: DC | PRN
  Administered 2023-04-27 (×2): 1000 mL

## 2023-04-27 MED ORDER — ZINC GLUCONATE 50 MG PO TABS: 50.0000 mg | ORAL_TABLET | Freq: Every day | ORAL | Status: DC

## 2023-04-27 MED ORDER — ENOXAPARIN SODIUM 40 MG/0.4ML IJ SOSY
PREFILLED_SYRINGE | INTRAMUSCULAR | Status: AC
  Filled 2023-04-27: qty 0.4

## 2023-04-27 MED ORDER — PROPOFOL 10 MG/ML IV BOLUS
INTRAVENOUS | Status: DC | PRN
  Administered 2023-04-27: 130 mg via INTRAVENOUS
  Administered 2023-04-27: 30 mg via INTRAVENOUS

## 2023-04-27 MED ORDER — LIDOCAINE 2% (20 MG/ML) 5 ML SYRINGE
INTRAMUSCULAR | Status: DC | PRN
  Administered 2023-04-27: 40 mg via INTRAVENOUS

## 2023-04-27 MED ORDER — BUPIVACAINE HCL (PF) 0.5 % IJ SOLN
INTRAMUSCULAR | Status: DC | PRN
  Administered 2023-04-27: 16 mL

## 2023-04-27 MED ORDER — LACTATED RINGERS IV SOLN: INTRAVENOUS | Status: DC

## 2023-04-27 MED ORDER — FENTANYL CITRATE (PF) 250 MCG/5ML IJ SOLN
INTRAMUSCULAR | Status: DC | PRN
  Administered 2023-04-27: 25 ug via INTRAVENOUS
  Administered 2023-04-27: 50 ug via INTRAVENOUS
  Administered 2023-04-27: 100 ug via INTRAVENOUS
  Administered 2023-04-27: 50 ug via INTRAVENOUS
  Administered 2023-04-27: 25 ug via INTRAVENOUS

## 2023-04-27 MED ORDER — ENOXAPARIN SODIUM 40 MG/0.4ML IJ SOSY
40.0000 mg | PREFILLED_SYRINGE | Freq: Once | INTRAMUSCULAR | Status: AC
  Administered 2023-04-27: 40 mg via SUBCUTANEOUS

## 2023-04-27 MED ORDER — SUGAMMADEX SODIUM 200 MG/2ML IV SOLN
INTRAVENOUS | Status: DC | PRN
  Administered 2023-04-27: 200 mg via INTRAVENOUS

## 2023-04-27 MED ORDER — OXYCODONE HCL 5 MG PO TABS: 5.0000 mg | ORAL_TABLET | Freq: Once | ORAL | Status: DC | PRN

## 2023-04-27 MED ORDER — OXYCODONE HCL 5 MG PO TABS
5.0000 mg | ORAL_TABLET | ORAL | Status: DC | PRN
  Administered 2023-04-27 – 2023-04-28 (×2): 10 mg via ORAL
  Administered 2023-04-28 (×2): 5 mg via ORAL
  Filled 2023-04-27: qty 1
  Filled 2023-04-27: qty 2
  Filled 2023-04-27: qty 1
  Filled 2023-04-27: qty 2

## 2023-04-27 MED ORDER — ESMOLOL HCL 100 MG/10ML IV SOLN
INTRAVENOUS | Status: AC
  Filled 2023-04-27: qty 10

## 2023-04-27 MED ORDER — CHLORHEXIDINE GLUCONATE 0.12 % MT SOLN: 15.0000 mL | Freq: Once | OROMUCOSAL | Status: DC

## 2023-04-27 MED ORDER — LORATADINE 10 MG PO TABS: 10.0000 mg | ORAL_TABLET | Freq: Every day | ORAL | Status: DC | PRN

## 2023-04-27 MED ORDER — ACETAMINOPHEN 10 MG/ML IV SOLN
INTRAVENOUS | Status: DC | PRN
  Administered 2023-04-27: 1000 mg via INTRAVENOUS

## 2023-04-27 MED ORDER — CHLORHEXIDINE GLUCONATE CLOTH 2 % EX PADS
6.0000 | MEDICATED_PAD | Freq: Once | CUTANEOUS | Status: AC
  Administered 2023-04-27: 6 via TOPICAL

## 2023-04-27 MED ORDER — KETOROLAC TROMETHAMINE 15 MG/ML IJ SOLN
INTRAMUSCULAR | Status: AC
  Filled 2023-04-27: qty 1

## 2023-04-27 MED ORDER — SUCCINYLCHOLINE CHLORIDE 200 MG/10ML IV SOSY
PREFILLED_SYRINGE | INTRAVENOUS | Status: AC
  Filled 2023-04-27: qty 10

## 2023-04-27 MED ORDER — ACETAMINOPHEN 650 MG RE SUPP: 650.0000 mg | Freq: Four times a day (QID) | RECTAL | Status: DC | PRN

## 2023-04-27 MED ORDER — ONDANSETRON HCL 4 MG/2ML IJ SOLN
INTRAMUSCULAR | Status: DC | PRN
  Administered 2023-04-27: 4 mg via INTRAVENOUS

## 2023-04-27 MED ORDER — BUPIVACAINE HCL (PF) 0.5 % IJ SOLN
INTRAMUSCULAR | Status: AC
  Filled 2023-04-27: qty 30

## 2023-04-27 MED ORDER — STERILE WATER FOR IRRIGATION IR SOLN
Status: DC | PRN
  Administered 2023-04-27: 1000 mL

## 2023-04-27 MED ORDER — POVIDONE-IODINE 10 % EX OINT
TOPICAL_OINTMENT | CUTANEOUS | Status: AC
  Filled 2023-04-27: qty 1

## 2023-04-27 MED ORDER — ONDANSETRON 4 MG PO TBDP: 4.0000 mg | ORAL_TABLET | Freq: Four times a day (QID) | ORAL | Status: DC | PRN

## 2023-04-27 MED ORDER — DIPHENHYDRAMINE HCL 50 MG/ML IJ SOLN: 12.5000 mg | Freq: Four times a day (QID) | INTRAMUSCULAR | Status: DC | PRN

## 2023-04-27 MED ORDER — ORAL CARE MOUTH RINSE: 15.0000 mL | Freq: Once | OROMUCOSAL | Status: DC

## 2023-04-27 MED ORDER — ROCURONIUM BROMIDE 10 MG/ML (PF) SYRINGE
PREFILLED_SYRINGE | INTRAVENOUS | Status: AC
  Filled 2023-04-27: qty 10

## 2023-04-27 MED ORDER — PHENYLEPHRINE HCL (PRESSORS) 10 MG/ML IV SOLN
INTRAVENOUS | Status: DC | PRN
  Administered 2023-04-27: 160 ug via INTRAVENOUS

## 2023-04-27 MED ORDER — DEXAMETHASONE SODIUM PHOSPHATE 10 MG/ML IJ SOLN
INTRAMUSCULAR | Status: AC
  Filled 2023-04-27: qty 1

## 2023-04-27 MED ORDER — PANTOPRAZOLE SODIUM 40 MG PO TBEC
40.0000 mg | DELAYED_RELEASE_TABLET | Freq: Every day | ORAL | Status: DC
  Administered 2023-04-28 – 2023-04-29 (×2): 40 mg via ORAL
  Filled 2023-04-27 (×2): qty 1

## 2023-04-27 MED ORDER — SODIUM CHLORIDE 0.9 % IV SOLN
2.0000 g | INTRAVENOUS | Status: AC
  Administered 2023-04-27: 2 g via INTRAVENOUS

## 2023-04-27 MED ORDER — ONDANSETRON HCL 4 MG/2ML IJ SOLN
INTRAMUSCULAR | Status: AC
  Filled 2023-04-27: qty 2

## 2023-04-27 MED ORDER — INDOCYANINE GREEN 25 MG IV SOLR
INTRAVENOUS | Status: AC
  Filled 2023-04-27: qty 10

## 2023-04-27 MED ORDER — ADULT MULTIVITAMIN W/MINERALS CH
1.0000 | ORAL_TABLET | Freq: Every day | ORAL | Status: DC
  Administered 2023-04-27 – 2023-04-30 (×4): 1 via ORAL
  Filled 2023-04-27 (×4): qty 1

## 2023-04-27 MED ORDER — ESMOLOL HCL 100 MG/10ML IV SOLN
INTRAVENOUS | Status: DC | PRN
  Administered 2023-04-27: 10 mg via INTRAVENOUS

## 2023-04-27 MED ORDER — LIDOCAINE HCL (PF) 2 % IJ SOLN
INTRAMUSCULAR | Status: AC
  Filled 2023-04-27: qty 5

## 2023-04-27 MED ORDER — ROCURONIUM BROMIDE 10 MG/ML (PF) SYRINGE
PREFILLED_SYRINGE | INTRAVENOUS | Status: DC | PRN
  Administered 2023-04-27: 20 mg via INTRAVENOUS
  Administered 2023-04-27: 60 mg via INTRAVENOUS

## 2023-04-27 MED ORDER — HYDROMORPHONE HCL 1 MG/ML IJ SOLN
1.0000 mg | INTRAMUSCULAR | Status: DC | PRN
  Administered 2023-04-27: 1 mg via INTRAVENOUS
  Filled 2023-04-27: qty 1

## 2023-04-27 MED ORDER — ENOXAPARIN SODIUM 40 MG/0.4ML IJ SOSY
40.0000 mg | PREFILLED_SYRINGE | INTRAMUSCULAR | Status: DC
  Administered 2023-04-28 – 2023-04-30 (×3): 40 mg via SUBCUTANEOUS
  Filled 2023-04-27 (×3): qty 0.4

## 2023-04-27 MED ORDER — SODIUM CHLORIDE 0.9 % IV SOLN: INTRAVENOUS | Status: DC | PRN

## 2023-04-27 MED ORDER — ALVIMOPAN 12 MG PO CAPS
12.0000 mg | ORAL_CAPSULE | Freq: Two times a day (BID) | ORAL | Status: DC
  Administered 2023-04-28 (×2): 12 mg via ORAL
  Filled 2023-04-27 (×3): qty 1

## 2023-04-27 MED ORDER — ALVIMOPAN 12 MG PO CAPS
12.0000 mg | ORAL_CAPSULE | ORAL | Status: AC
  Administered 2023-04-27: 12 mg via ORAL

## 2023-04-27 MED ORDER — LACTATED RINGERS IV SOLN: INTRAVENOUS | Status: DC | PRN

## 2023-04-27 MED ORDER — ACETAMINOPHEN 10 MG/ML IV SOLN
INTRAVENOUS | Status: AC
  Filled 2023-04-27: qty 100

## 2023-04-27 MED ORDER — LABETALOL HCL 5 MG/ML IV SOLN
INTRAVENOUS | Status: AC
  Filled 2023-04-27: qty 4

## 2023-04-27 MED ORDER — FENTANYL CITRATE PF 50 MCG/ML IJ SOSY
25.0000 ug | PREFILLED_SYRINGE | INTRAMUSCULAR | Status: DC | PRN
  Administered 2023-04-27: 25 ug via INTRAVENOUS
  Administered 2023-04-27 (×2): 50 ug via INTRAVENOUS
  Filled 2023-04-27 (×3): qty 1

## 2023-04-27 MED ORDER — ACETAMINOPHEN 325 MG PO TABS: 650.0000 mg | ORAL_TABLET | Freq: Four times a day (QID) | ORAL | Status: DC | PRN

## 2023-04-27 MED ORDER — DEXAMETHASONE SODIUM PHOSPHATE 10 MG/ML IJ SOLN
INTRAMUSCULAR | Status: DC | PRN
  Administered 2023-04-27: 5 mg via INTRAVENOUS

## 2023-04-27 MED ORDER — OXYCODONE HCL 5 MG/5ML PO SOLN: 5.0000 mg | Freq: Once | ORAL | Status: DC | PRN

## 2023-04-27 MED ORDER — FENTANYL CITRATE (PF) 250 MCG/5ML IJ SOLN
INTRAMUSCULAR | Status: AC
  Filled 2023-04-27: qty 5

## 2023-04-27 MED ORDER — PHENYLEPHRINE 80 MCG/ML (10ML) SYRINGE FOR IV PUSH (FOR BLOOD PRESSURE SUPPORT)
PREFILLED_SYRINGE | INTRAVENOUS | Status: AC
  Filled 2023-04-27: qty 30

## 2023-04-27 MED ORDER — DIPHENHYDRAMINE HCL 12.5 MG/5ML PO ELIX: 12.5000 mg | ORAL_SOLUTION | Freq: Four times a day (QID) | ORAL | Status: DC | PRN

## 2023-04-27 MED ORDER — KETOROLAC TROMETHAMINE 15 MG/ML IJ SOLN: 15.0000 mg | Freq: Four times a day (QID) | INTRAMUSCULAR | Status: DC | PRN

## 2023-04-27 MED ORDER — ONDANSETRON HCL 4 MG/2ML IJ SOLN: 4.0000 mg | Freq: Once | INTRAMUSCULAR | Status: DC | PRN

## 2023-04-27 MED ORDER — KETOROLAC TROMETHAMINE 15 MG/ML IJ SOLN
15.0000 mg | Freq: Four times a day (QID) | INTRAMUSCULAR | Status: AC
  Administered 2023-04-27: 15 mg via INTRAVENOUS

## 2023-04-27 MED ORDER — LABETALOL HCL 5 MG/ML IV SOLN
INTRAVENOUS | Status: DC | PRN
  Administered 2023-04-27: 2.5 mg via INTRAVENOUS

## 2023-04-27 MED ORDER — PHENYLEPHRINE HCL-NACL 20-0.9 MG/250ML-% IV SOLN
INTRAVENOUS | Status: DC | PRN
  Administered 2023-04-27: 30 ug/min via INTRAVENOUS

## 2023-04-27 MED ORDER — SODIUM CHLORIDE 0.9 % IV SOLN
INTRAVENOUS | Status: AC
  Filled 2023-04-27: qty 2

## 2023-04-27 MED ORDER — ALVIMOPAN 12 MG PO CAPS
ORAL_CAPSULE | ORAL | Status: AC
  Filled 2023-04-27: qty 1

## 2023-04-27 SURGICAL SUPPLY — 60 items
ADH SKN CLS APL DERMABOND .7 (GAUZE/BANDAGES/DRESSINGS) ×1
CANNULA REDUCER 12-8 DVNC XI (CANNULA) ×1 IMPLANT
CELLS DAT CNTRL 66122 CELL SVR (MISCELLANEOUS) IMPLANT
CLIP LIGATING HEMO O LOK GREEN (MISCELLANEOUS) IMPLANT
COVER TIP SHEARS 8 DVNC (MISCELLANEOUS) ×1 IMPLANT
DERMABOND ADVANCED .7 DNX12 (GAUZE/BANDAGES/DRESSINGS) ×1 IMPLANT
DRAPE ARM DVNC X/XI (DISPOSABLE) ×4 IMPLANT
DRAPE COLUMN DVNC XI (DISPOSABLE) ×1 IMPLANT
DRSG OPSITE POSTOP 4X8 (GAUZE/BANDAGES/DRESSINGS) IMPLANT
DRSG TEGADERM 2-3/8X2-3/4 SM (GAUZE/BANDAGES/DRESSINGS) IMPLANT
ELECT REM PT RETURN 9FT ADLT (ELECTROSURGICAL) ×1
ELECTRODE REM PT RTRN 9FT ADLT (ELECTROSURGICAL) ×1 IMPLANT
GAUZE SPONGE 2X2 STRL 8-PLY (GAUZE/BANDAGES/DRESSINGS) IMPLANT
GLOVE BIO SURGEON STRL SZ 6.5 (GLOVE) IMPLANT
GLOVE BIO SURGEON STRL SZ7 (GLOVE) ×3 IMPLANT
GLOVE BIOGEL PI IND STRL 6.5 (GLOVE) IMPLANT
GLOVE BIOGEL PI IND STRL 7.0 (GLOVE) ×5 IMPLANT
GLOVE BIOGEL PI IND STRL 7.5 (GLOVE) IMPLANT
GLOVE ECLIPSE 6.5 STRL STRAW (GLOVE) IMPLANT
GLOVE SURG SS PI 7.5 STRL IVOR (GLOVE) ×2 IMPLANT
GOWN STRL REUS W/TWL LRG LVL3 (GOWN DISPOSABLE) ×5 IMPLANT
IV NS 1000ML (IV SOLUTION) ×2
IV NS 1000ML BAXH (IV SOLUTION) IMPLANT
KIT PINK PAD W/HEAD ARE REST (MISCELLANEOUS) ×1
KIT PINK PAD W/HEAD ARM REST (MISCELLANEOUS) ×1 IMPLANT
LIGASURE IMPACT 36 18CM CVD LR (INSTRUMENTS) IMPLANT
MANIFOLD NEPTUNE II (INSTRUMENTS) ×1 IMPLANT
NDL HYPO 21X1.5 SAFETY (NEEDLE) IMPLANT
NDL HYPO 22X1.5 SAFETY MO (MISCELLANEOUS) ×1 IMPLANT
NDL INSUFFLATION 14GA 120MM (NEEDLE) IMPLANT
NEEDLE HYPO 21X1.5 SAFETY (NEEDLE) ×1 IMPLANT
NEEDLE HYPO 22X1.5 SAFETY MO (MISCELLANEOUS) ×1 IMPLANT
NEEDLE INSUFFLATION 14GA 120MM (NEEDLE) ×1 IMPLANT
OBTURATOR OPTICAL STND 8 DVNC (TROCAR) ×1
OBTURATOR OPTICALSTD 8 DVNC (TROCAR) ×1 IMPLANT
PACK COLON (CUSTOM PROCEDURE TRAY) IMPLANT
PENCIL HANDSWITCHING (ELECTRODE) IMPLANT
RELOAD PROXIMATE 75MM BLUE (ENDOMECHANICALS) ×1 IMPLANT
RELOAD STAPLE 60 3.5 BLU DVNC (STAPLE) IMPLANT
RELOAD STAPLE 75 3.8 BLU REG (ENDOMECHANICALS) IMPLANT
RELOAD STAPLER 3.5X60 BLU DVNC (STAPLE) ×1 IMPLANT
RETRACTOR WND ALEXIS 18 MED (MISCELLANEOUS) IMPLANT
RETRACTOR WOUND ALXS 18CM MED (MISCELLANEOUS) IMPLANT
RTRCTR WOUND ALEXIS 18CM MED (MISCELLANEOUS)
RTRCTR WOUND ALEXIS O 18CM MED (MISCELLANEOUS) ×1
SEAL CANN UNIV 5-8 DVNC XI (MISCELLANEOUS) ×3 IMPLANT
SEALER VESSEL EXT DVNC XI (MISCELLANEOUS) IMPLANT
STAPLER 60 SUREFORM DVNC (STAPLE) IMPLANT
STAPLER CANNULA SEAL DVNC XI (STAPLE) ×1 IMPLANT
STAPLER GUN LINEAR PROX 60 (STAPLE) IMPLANT
STAPLER PROXIMATE 75MM BLUE (STAPLE) IMPLANT
STAPLER RELOAD 3.5X60 BLU DVNC (STAPLE) ×1
STAPLER VISISTAT (STAPLE) IMPLANT
SUT PDS AB 0 CTX 60 (SUTURE) IMPLANT
SUT SILK 3 0 SH CR/8 (SUTURE) IMPLANT
SUT VICRYL 0 UR6 27IN ABS (SUTURE) IMPLANT
SYR 30ML LL (SYRINGE) ×2 IMPLANT
TRAY FOLEY MTR SLVR 16FR STAT (SET/KITS/TRAYS/PACK) ×1 IMPLANT
WATER STERILE IRR 1000ML POUR (IV SOLUTION) IMPLANT
YANKAUER SUCT BULB TIP 10FT TU (MISCELLANEOUS) IMPLANT

## 2023-04-27 NOTE — Anesthesia Procedure Notes (Addendum)
Arterial Line Insertion Start/End5/09/2023 12:55 PM, 04/27/2023 1:00 PM Performed by: Lorin Glass, CRNA, CRNA  Patient location: OR. Preanesthetic checklist: patient identified, IV checked, risks and benefits discussed, surgical consent, monitors and equipment checked, pre-op evaluation, timeout performed and anesthesia consent Patient sedated Left, radial was placed Catheter size: 20 G Hand hygiene performed   Attempts: 1 Procedure performed without using ultrasound guided technique. Following insertion, Biopatch and dressing applied. Post procedure assessment: normal

## 2023-04-27 NOTE — Transfer of Care (Signed)
Immediate Anesthesia Transfer of Care Note  Patient: Bradley Trujillo  Procedure(s) Performed: XI ROBOT ASSISTED LAPAROSCOPIC PARTIAL COLECTOMY (Abdomen)  Patient Location: PACU  Anesthesia Type:General  Level of Consciousness: awake  Airway & Oxygen Therapy: Patient Spontanous Breathing and Patient connected to nasal cannula oxygen  Post-op Assessment: Report given to RN and Post -op Vital signs reviewed and stable  Post vital signs: Reviewed and stable  Last Vitals:  Vitals Value Taken Time  BP 135/99   Temp 99.6   Pulse 119 04/27/23 1601  Resp 19 04/27/23 1601  SpO2  99% 04/27/23 1601  Vitals shown include unvalidated device data.  Last Pain:  Vitals:   04/27/23 1052  TempSrc: Oral  PainSc: 0-No pain         Complications: No notable events documented.

## 2023-04-27 NOTE — Anesthesia Procedure Notes (Addendum)
Procedure Name: Intubation Date/Time: 04/27/2023 12:57 PM  Performed by: Cy Blamer, CRNAPre-anesthesia Checklist: Patient identified, Emergency Drugs available, Suction available and Patient being monitored Patient Re-evaluated:Patient Re-evaluated prior to induction Oxygen Delivery Method: Circle system utilized Preoxygenation: Pre-oxygenation with 100% oxygen Induction Type: IV induction Ventilation: Mask ventilation without difficulty Laryngoscope Size: Glidescope and 3 Grade View: Grade I Tube size: 7.0 mm Number of attempts: 1 Airway Equipment and Method: Video-laryngoscopy and Rigid stylet Placement Confirmation: ETT inserted through vocal cords under direct vision, positive ETCO2 and breath sounds checked- equal and bilateral Secured at: 21 cm Tube secured with: Tape Dental Injury: Teeth and Oropharynx as per pre-operative assessment

## 2023-04-27 NOTE — Anesthesia Preprocedure Evaluation (Signed)
Anesthesia Evaluation  Patient identified by MRN, date of birth, ID band Patient awake    Reviewed: Allergy & Precautions, H&P , NPO status , Patient's Chart, lab work & pertinent test results, reviewed documented beta blocker date and time   Airway Mallampati: II  TM Distance: >3 FB Neck ROM: full    Dental no notable dental hx.    Pulmonary neg pulmonary ROS, former smoker   Pulmonary exam normal breath sounds clear to auscultation       Cardiovascular Exercise Tolerance: Good hypertension, negative cardio ROS + dysrhythmias  Rhythm:regular Rate:Normal     Neuro/Psych  Neuromuscular disease negative neurological ROS  negative psych ROS   GI/Hepatic negative GI ROS, Neg liver ROS, hiatal hernia,GERD  ,,  Endo/Other  negative endocrine ROS    Renal/GU negative Renal ROS  negative genitourinary   Musculoskeletal   Abdominal   Peds  Hematology negative hematology ROS (+) Blood dyscrasia, anemia   Anesthesia Other Findings   Reproductive/Obstetrics negative OB ROS                             Anesthesia Physical Anesthesia Plan  ASA: 3  Anesthesia Plan: General and General ETT   Post-op Pain Management:    Induction:   PONV Risk Score and Plan: Ondansetron  Airway Management Planned:   Additional Equipment:   Intra-op Plan:   Post-operative Plan:   Informed Consent: I have reviewed the patients History and Physical, chart, labs and discussed the procedure including the risks, benefits and alternatives for the proposed anesthesia with the patient or authorized representative who has indicated his/her understanding and acceptance.     Dental Advisory Given  Plan Discussed with: CRNA  Anesthesia Plan Comments:        Anesthesia Quick Evaluation

## 2023-04-27 NOTE — Interval H&P Note (Signed)
History and Physical Interval Note:  04/27/2023 12:03 PM  Bradley Trujillo  has presented today for surgery, with the diagnosis of CECAL CANCER.  The various methods of treatment have been discussed with the patient and family. After consideration of risks, benefits and other options for treatment, the patient has consented to  Procedure(s): XI ROBOT ASSISTED LAPAROSCOPIC PARTIAL COLECTOMY (N/A) as a surgical intervention.  The patient's history has been reviewed, patient examined, no change in status, stable for surgery.  I have reviewed the patient's chart and labs.  Questions were answered to the patient's satisfaction.     [name]

## 2023-04-27 NOTE — Op Note (Signed)
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  This was biopsy-proven.  He now presents for a robotic assisted laparoscopic right hemicolectomy.  He has been cleared by cardiology for general anesthesia.  The risks and benefits of the procedure including bleeding, infection, cardiopulmonary difficulties, the possibility of a blood transfusion, the possibility of anastomotic leak, and the possibility of bowel injury were fully explained to the patient, who gave informed consent.  Procedure note: The patient was placed in the supine position.  After induction of general endotracheal anesthesia, the abdomen was prepped and draped using the usual sterile technique with ChloraPrep.  Surgical site confirmation was performed.  An incision was made in Palmer's point.  A Veress needle was introduced into the abdominal cavity and confirmation of placement was done using the saline drop test.  The abdomen was then insufflated to 15 mmHg pressure.  An 8 mm trocar was placed in the left upper quadrant under direct visualization without difficulty.  An additional 8 mm trocar was placed in the infraumbilical region and right lower quadrant regions.  A 12 mm trocar was placed in the left paramedian region.  The patient was then rolled to the left side and the bed was flexed.  The robot was then docked and targeted.  On inspection, the patient had adhesion of the cecum and terminal ileum to the piece of mesh that was in place in the right lower quadrant of the abdomen.  This was bluntly and sharply removed from the mesh using a vessel sealer.  Once this was  performed, a lateral to medial dissection was performed.  The dissection was begun along the line of Toldt.  I did come across the hepatic flexure using the vessel sealer.  I was able to bring down the hepatic flexure in order to identify the duodenum.  The proximal transverse colon looped back down towards the cecum with an omental attachment to the cecum that may have been secondary to tumor reaction.  A blue load robotic GIA stapler was placed across the terminal ileum and fired.  The vessel sealer was then used to go up the right colon and ileocolic mesentery.  The robot was then undocked and the abdomen decompressed.  I then made a small midline incision in order to exteriorize the right colon and proximal transverse colon.  A GIA 75 stapler was placed across the proximal transverse colon and fired.  The mesentery was then completely divided using the LigaSure.  A side-to-side ileocolic anastomosis was performed using a GIA 75 stapler.  The enterotomy was closed using a TA 60 stapler.  The staple line was bolstered with both 3-0 silk Lembert sutures and surrounding omentum was placed over the anastomosis.  The bowel was then dropped back into the abdomen in an orderly fashion.  The abdominal cavity was copiously irrigated normal saline.  No bowel injury was noted on running the small bowel.  No abnormal bleeding was noted.  All operating personnel then changed their gown and gloves.  A new set up was used for closure.  All wounds were irrigated normal saline.  All wounds were injected with 0.5% Sensorcaine.  The 12 mm  trocar site fascia was reapproximated using an 0 Vicryl interrupted suture.  The midline incision was closed using an 0 PDS running suture.  All incisions were closed using staples.  Betadine ointment and dry sterile dressings were applied.  All tape and needle counts were correct at the end of the procedure.  The patient was extubated in the operating room and transferred to PACU in stable  condition.  Complications: None  EBL: 75 cc  Specimen: Right colon

## 2023-04-28 LAB — CBC
HCT: 38.7 % — ABNORMAL LOW (ref 39.0–52.0)
Hemoglobin: 12.3 g/dL — ABNORMAL LOW (ref 13.0–17.0)
MCH: 26.5 pg (ref 26.0–34.0)
MCHC: 31.8 g/dL (ref 30.0–36.0)
MCV: 83.4 fL (ref 80.0–100.0)
Platelets: 225 10*3/uL (ref 150–400)
RBC: 4.64 MIL/uL (ref 4.22–5.81)
RDW: 21.9 % — ABNORMAL HIGH (ref 11.5–15.5)
WBC: 7.5 10*3/uL (ref 4.0–10.5)
nRBC: 0 % (ref 0.0–0.2)

## 2023-04-28 LAB — BASIC METABOLIC PANEL
Anion gap: 5 (ref 5–15)
BUN: 14 mg/dL (ref 8–23)
CO2: 26 mmol/L (ref 22–32)
Calcium: 8.9 mg/dL (ref 8.9–10.3)
Chloride: 104 mmol/L (ref 98–111)
Creatinine, Ser: 1.34 mg/dL — ABNORMAL HIGH (ref 0.61–1.24)
GFR, Estimated: 57 mL/min — ABNORMAL LOW (ref >60)
Glucose, Bld: 99 mg/dL (ref 70–99)
Potassium: 3.9 mmol/L (ref 3.5–5.1)
Sodium: 135 mmol/L (ref 135–145)

## 2023-04-28 LAB — MAGNESIUM: Magnesium: 2 mg/dL (ref 1.7–2.4)

## 2023-04-28 LAB — PHOSPHORUS: Phosphorus: 3.5 mg/dL (ref 2.5–4.6)

## 2023-04-28 MED ORDER — ACETAMINOPHEN 500 MG PO TABS
1000.0000 mg | ORAL_TABLET | Freq: Four times a day (QID) | ORAL | Status: DC
  Administered 2023-04-28 – 2023-04-30 (×7): 1000 mg via ORAL
  Filled 2023-04-28 (×9): qty 2

## 2023-04-28 NOTE — Progress Notes (Signed)
[facility] Progress Note  1 Day Post-Op  Subjective: Sore and did not take meds all night but woke up with pain. Foley with good UOP. Tolerating the clears and no nausea/ no flatus or Bm.  Discussed surgery and when pathology would be back. 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   Objective: Vital signs in last 24 hours: Temp:  [98.4 F (36.9 C)-99.6 F (37.6 C)] 99.1 F (37.3 C) (05/11 0934) Pulse Rate:  [84-120] 86 (05/11 0934) Resp:  [10-22] 18 (05/11 0610) BP: (101-146)/(71-123) 109/82 (05/11 0934) SpO2:  [95 %-99 %] 97 % (05/11 0934)    Intake/Output from previous day: 05/10 0701 - 05/11 0700 In: 1420 [P.O.:120; I.V.:1100; IV Piggyback:200] Out: 275 [Urine:250; Blood:25] Intake/Output this shift: Total I/O In: 240 [P.O.:240] Out: 550 [Urine:550]  General appearance: alert and no distress Resp: normal work of breathing GI: soft, mildly distended, dressings in place, betadine on the  dressing  Lab Results:  Recent Labs    04/28/23 0527  WBC 7.5  HGB 12.3*  HCT 38.7*  PLT 225   BMET Recent Labs    04/28/23 0527  NA 135  K 3.9  CL 104  CO2 26  GLUCOSE 99  BUN 14  CREATININE 1.34*  CALCIUM 8.9   PT/INR No results for input(s): "LABPROT", "INR" in the last 72 hours.  Studies/Results: No results found.  Anti-infectives: Anti-infectives (From admission, onward)    Start     Dose/Rate Route Frequency Ordered Stop   04/27/23 1030  cefoTEtan (CEFOTAN) 2 g in sodium chloride 0.9 % 100 mL IVPB        2 g 200 mL/hr over 30 Minutes Intravenous On call to O.R. 04/27/23 1016 04/27/23 1332   04/27/23 1019  sodium chloride 0.9 % with cefoTEtan (CEFOTAN) ADS Med       Note to Pharmacy: Leota Jacobsen: cabinet override      04/27/23 1019 04/27/23 1314       Assessment/Plan: Bradley Trujillo is a 72 yo s/p XI ROBOT ASSISTED LAPAROSCOPIC PARTIAL COLECTOMY for cecal cancer. Doing well.  I have scheduled  tylenol PRN narcotics IS, OOB Telemetry for today Clear diet, entereg, awaiting bowel function Labs tomorrow, H&H drifted some no signs of bleeding Foley out, LR stopped SCDs, lovenox  Bedside commode  Told him to ambulate and to get to a chair today    LOS: 1 day    Lucretia Roers 04/28/2023

## 2023-04-28 NOTE — TOC Progression Note (Signed)
  Transition of Care [facility]) Screening Note   Patient Details  Name: Bradley Trujillo Date of Birth: July 16, 1951   Transition of [facility]) CM/SW Contact:    Elliot Gault, LCSW Phone Number: 04/28/2023, 11:09 AM    Transition of Care Department [facility]) has reviewed patient and no TOC needs have been identified at this time. We will continue to monitor patient advancement through interdisciplinary progression rounds. If new patient transition needs arise, please place a TOC consult.

## 2023-04-28 NOTE — Anesthesia Postprocedure Evaluation (Signed)
Anesthesia Post Note  Patient: Bradley Trujillo  Procedure(s) Performed: XI ROBOT ASSISTED LAPAROSCOPIC PARTIAL COLECTOMY (Abdomen)  Patient location during evaluation: Phase II Anesthesia Type: General Level of consciousness: awake Pain management: pain level controlled Vital Signs Assessment: post-procedure vital signs reviewed and stable Respiratory status: spontaneous breathing and respiratory function stable Cardiovascular status: blood pressure returned to baseline and stable Postop Assessment: no headache and no apparent nausea or vomiting Anesthetic complications: no Comments: Late entry   No notable events documented.   Last Vitals:  Vitals:   04/27/23 1717 04/27/23 2103  BP: 103/71 101/72  Pulse: (!) 105 (!) 101  Resp: 18 18  Temp:  36.9 C  SpO2: 95% 98%    Last Pain:  Vitals:   04/27/23 2103  TempSrc: Oral  PainSc: 0-No pain                 Windell Norfolk

## 2023-04-29 LAB — CBC
HCT: 37.6 % — ABNORMAL LOW (ref 39.0–52.0)
HCT: 38.6 % — ABNORMAL LOW (ref 39.0–52.0)
Hemoglobin: 11.8 g/dL — ABNORMAL LOW (ref 13.0–17.0)
Hemoglobin: 12.1 g/dL — ABNORMAL LOW (ref 13.0–17.0)
MCH: 26.2 pg (ref 26.0–34.0)
MCH: 26.2 pg (ref 26.0–34.0)
MCHC: 31.4 g/dL (ref 30.0–36.0)
MCHC: 31.3 g/dL (ref 30.0–36.0)
MCV: 83.6 fL (ref 80.0–100.0)
MCV: 83.5 fL (ref 80.0–100.0)
Platelets: 210 10*3/uL (ref 150–400)
Platelets: 223 10*3/uL (ref 150–400)
RBC: 4.5 MIL/uL (ref 4.22–5.81)
RBC: 4.62 MIL/uL (ref 4.22–5.81)
RDW: 21.3 % — ABNORMAL HIGH (ref 11.5–15.5)
RDW: 21.3 % — ABNORMAL HIGH (ref 11.5–15.5)
WBC: 7.3 10*3/uL (ref 4.0–10.5)
WBC: 7.8 10*3/uL (ref 4.0–10.5)
nRBC: 0 % (ref 0.0–0.2)
nRBC: 0 % (ref 0.0–0.2)

## 2023-04-29 LAB — MAGNESIUM: Magnesium: 1.9 mg/dL (ref 1.7–2.4)

## 2023-04-29 LAB — BASIC METABOLIC PANEL
Anion gap: 6 (ref 5–15)
BUN: 10 mg/dL (ref 8–23)
CO2: 24 mmol/L (ref 22–32)
Calcium: 8.9 mg/dL (ref 8.9–10.3)
Chloride: 105 mmol/L (ref 98–111)
Creatinine, Ser: 1.15 mg/dL (ref 0.61–1.24)
GFR, Estimated: >60 mL/min (ref >60)
Glucose, Bld: 102 mg/dL — ABNORMAL HIGH (ref 70–99)
Potassium: 3.7 mmol/L (ref 3.5–5.1)
Sodium: 135 mmol/L (ref 135–145)

## 2023-04-29 LAB — PHOSPHORUS: Phosphorus: 2.4 mg/dL — ABNORMAL LOW (ref 2.5–4.6)

## 2023-04-29 MED ORDER — POTASSIUM & SODIUM PHOSPHATES 280-160-250 MG PO PACK
1.0000 | PACK | Freq: Three times a day (TID) | ORAL | Status: AC
  Administered 2023-04-29 – 2023-04-30 (×3): 1 via ORAL
  Filled 2023-04-29 (×3): qty 1

## 2023-04-29 MED ORDER — MAGNESIUM SULFATE 2 GM/50ML IV SOLN
2.0000 g | Freq: Once | INTRAVENOUS | Status: AC
  Administered 2023-04-29: 2 g via INTRAVENOUS
  Filled 2023-04-29: qty 50

## 2023-04-29 NOTE — Progress Notes (Signed)
[facility] Progress Note  2 Days Post-Op  Subjective: Multiple Bms since last night. No nausea. Only taking tylenol today. Ambulated yesterday.   Objective: Vital signs in last 24 hours: Temp:  [98 F (36.7 C)-98.4 F (36.9 C)] 98.4 F (36.9 C) (05/12 1258) Pulse Rate:  [68-91] 68 (05/12 1258) Resp:  [14-18] 16 (05/12 1258) BP: (118-125)/(84-89) 118/84 (05/12 1258) SpO2:  [97 %-100 %] 100 % (05/12 1258) Last BM Date : 04/29/23  Intake/Output from previous day: 05/11 0701 - 05/12 0700 In: 2020 [P.O.:1020; I.V.:1000] Out: 1301 [Urine:1300; Stool:1] Intake/Output this shift: Total I/O In: -  Out: 300 [Urine:300]  General appearance: alert and no distress Resp: normal work of breathing GI: soft, appropriately tender, bruising inferior to extraction site, removed the bandages to ensure only bruising and no erythema or drainage noted   Lab Results:  Recent Labs    04/29/23 0436 04/29/23 1439  WBC 7.3 7.8  HGB 11.8* 12.1*  HCT 37.6* 38.6*  PLT 210 223   BMET Recent Labs    04/28/23 0527 04/29/23 0436  NA 135 135  K 3.9 3.7  CL 104 105  CO2 26 24  GLUCOSE 99 102*  BUN 14 10  CREATININE 1.34* 1.15  CALCIUM 8.9 8.9   PT/INR No results for input(s): "LABPROT", "INR" in the last 72 hours.  Studies/Results: No results found.  Anti-infectives: Anti-infectives (From admission, onward)    Start     Dose/Rate Route Frequency Ordered Stop   04/27/23 1030  cefoTEtan (CEFOTAN) 2 g in sodium chloride 0.9 % 100 mL IVPB        2 g 200 mL/hr over 30 Minutes Intravenous On call to O.R. 04/27/23 1016 04/27/23 1332   04/27/23 1019  sodium chloride 0.9 % with cefoTEtan (CEFOTAN) ADS Med       Note to Pharmacy: Leota Jacobsen: cabinet override      04/27/23 1019 04/27/23 1314       Assessment/Plan: MR. Kuczek is a 72 yo s/p XI ROBOT ASSISTED LAPAROSCOPIC PARTIAL COLECTOMY for cecal cancer. Doing well.  Scheduled tylenol PRN narcotics IS, OOB HD  well, off monitor  Soft diet, multiple Bms  Labs tomorrow, H&H stable  Lytes replaced  SCDs, lovenox  Bedside commode  Ambulating     LOS: 2 days    Lucretia Roers 04/29/2023

## 2023-04-30 LAB — BASIC METABOLIC PANEL
Anion gap: 7 (ref 5–15)
BUN: 10 mg/dL (ref 8–23)
CO2: 24 mmol/L (ref 22–32)
Calcium: 8.9 mg/dL (ref 8.9–10.3)
Chloride: 106 mmol/L (ref 98–111)
Creatinine, Ser: 1.11 mg/dL (ref 0.61–1.24)
GFR, Estimated: >60 mL/min (ref >60)
Glucose, Bld: 92 mg/dL (ref 70–99)
Potassium: 4 mmol/L (ref 3.5–5.1)
Sodium: 137 mmol/L (ref 135–145)

## 2023-04-30 LAB — MAGNESIUM: Magnesium: 2.3 mg/dL (ref 1.7–2.4)

## 2023-04-30 LAB — PHOSPHORUS: Phosphorus: 2.8 mg/dL (ref 2.5–4.6)

## 2023-04-30 NOTE — Progress Notes (Signed)
Patient discharged home today, transported home by family. Discharge paperwork went over with patient, patient verbalized understanding. Belongings sent home with patient.  ?

## 2023-04-30 NOTE — Discharge Summary (Signed)
Physician Discharge Summary  Patient ID: Bradley Trujillo MRN: [id number] DOB/AGE: 1951/06/17 72 y.o.  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  He tolerated the surgery well.  His postoperative course was for the most part unremarkable.  His diet was advanced out difficulty.  He was noted to have mild hypophosphatemia which was corrected.  His diet was advanced without difficulty.  He is being discharged home on 04/30/2023 in good and improving condition.  Final pathology is pending.  Treatments: surgery: Robotic assisted laparoscopic partial colectomy with resection of terminal ileum on 04/27/2023  Discharge Exam: Blood pressure 101/72, pulse 90, temperature 98.2 F (36.8 C), temperature source Oral, resp. rate 16, height 5\' 4"  (1.626 m), weight 77 kg, SpO2 96 %. General appearance: alert, cooperative, and no distress Resp: clear to auscultation bilaterally Cardio: regular rate and rhythm, S1, S2 normal, no murmur, click, rub or gallop GI: Soft, incisions healing well.  Ecchymosis noted in the suprapubic region and stable.  Disposition: Discharge disposition: 01-Home or Self Care       Discharge Instructions     Diet - low sodium heart healthy   Complete by: As directed    Increase activity slowly   Complete by: As directed       Allergies as of 04/30/2023       Reactions   Penicillins Rash   Tolerated cefotetan on 04/27/23        Medication List     STOP taking these medications    metroNIDAZOLE 500 MG  tablet Commonly known as: Flagyl   neomycin 500 MG tablet Commonly known as: MYCIFRADIN       TAKE these medications    acetaminophen 500 MG tablet Commonly known as: TYLENOL Take 1,000 mg by mouth every 6 (six) hours as needed for moderate pain or mild pain.   celecoxib 200 MG capsule Commonly known as: CELEBREX Take 200 mg by mouth every other day.   diphenhydrAMINE 25 MG tablet Commonly known as: BENADRYL Take 25 mg by mouth every 6 (six) hours as needed for sleep.   ferrous sulfate 325 (65 FE) MG EC tablet Take 1 tablet (325 mg total) by mouth daily with breakfast.   Fish Oil 1200 MG Caps Take 1,200 mg by mouth daily.   loratadine 10 MG tablet Commonly known as: CLARITIN Take 10 mg by mouth daily as needed for allergies.   multivitamin with minerals Tabs tablet Take 1 tablet by mouth daily. Centrum   omeprazole 20 MG capsule Commonly known as: PRILOSEC Take 20 mg by mouth daily.   oxyCODONE-acetaminophen 5-325 MG tablet Commonly known as: PERCOCET/ROXICET Take 1-2 tablets by mouth every 4 (four) hours as needed for severe pain. What changed:  how much to take when to take this   Probiotic Caps Take 1 capsule by mouth daily.   SINEX LONG-ACTING NA Place 1 spray into the nose 2 (two) times daily as needed (congestion).   Visine Dry Eye 0.2-0.2-1 % Soln Generic drug: Glycerin-Hypromellose-PEG 400 Place 1 drop into both eyes 3 (three) times daily as needed (dry/irritated eyes.).   Vitamin D3 250 MCG (10000 UT)  capsule Take 10,000 Units by mouth every other day.   zinc gluconate 50 MG tablet Take 50 mg by mouth daily.        Follow-up Information     [name], MD. Schedule an appointment as soon as possible for a visit on 05/08/2023.   Specialty: General Surgery Contact information: [address] [place] [address] [phone]                 Signed: [name] 04/30/2023, 10:10 AM

## 2023-04-30 NOTE — Progress Notes (Signed)
No drainage noted to abdominal incisions. Patient c/o abdomen being sore but has only taken Tylenol this shift. No nausea. Patient has not had any Bms throughout the night. Patient has gotten up several times to the chair this shift with the walker.

## 2023-04-30 NOTE — Progress Notes (Signed)
Mobility Specialist Progress Note:   04/30/23 1058  Mobility  Activity Transferred to/from [facility]  Level of Assistance Standby assist, set-up cues, supervision of patient - no hands on  Assistive Device Front wheel walker  Distance Ambulated (ft) 10 ft  Range of Motion/Exercises Active  Activity Response Tolerated well  Mobility Referral Yes  $Mobility charge 1 Mobility  Mobility Specialist Start Time (ACUTE ONLY) 1045  Mobility Specialist Stop Time (ACUTE ONLY) 1058  Mobility Specialist Time Calculation (min) (ACUTE ONLY) 13 min   Pt requested assistance to transfer BR>C via RW with SBA. Tolerated well, asx throughout. Left pt in chair, all needs met.   Feliciana Rossetti Mobility Specialist Please contact via Special educational needs teacher or  Rehab office at [phone]

## 2023-05-01 LAB — SURGICAL PATHOLOGY

## 2023-05-02 ENCOUNTER — Telehealth (INDEPENDENT_AMBULATORY_CARE_PROVIDER_SITE_OTHER): Payer: 59 | Admitting: General Surgery

## 2023-05-02 DIAGNOSIS — Z09 Encounter for follow-up examination after completed treatment for conditions other than malignant neoplasm: Secondary | ICD-10-CM

## 2023-05-02 NOTE — Telephone Encounter (Signed)
Notified patient of pathology results.  He is doing well.  Will see him next week in office.

## 2023-05-08 ENCOUNTER — Encounter: Payer: Self-pay | Admitting: General Surgery

## 2023-05-08 ENCOUNTER — Ambulatory Visit (INDEPENDENT_AMBULATORY_CARE_PROVIDER_SITE_OTHER): Payer: 59 | Admitting: General Surgery

## 2023-05-08 VITALS — BP 128/82 | HR 73 | Temp 98.4°F | Resp 14 | Ht 64.0 in | Wt 166.0 lb

## 2023-05-08 DIAGNOSIS — Z09 Encounter for follow-up examination after completed treatment for conditions other than malignant neoplasm: Secondary | ICD-10-CM

## 2023-05-08 NOTE — Progress Notes (Signed)
Subjective:     Bradley Trujillo  Patient here for postoperative visit, status post robotic assisted laparoscopic partial colectomy.  He is doing very well.  He had some bowel urgency when he got home, but today it has resolved.  He denies any fever or chills.  He has minimal incisional pain. Objective:    BP 128/82   Pulse 73   Temp 98.4 F (36.9 C) (Oral)   Resp 14   Ht 5\' 4"  (1.626 m)   Wt 166 lb (75.3 kg)   SpO2 97%   BMI 28.49 kg/m   General:  alert, cooperative, and no distress  Abdomen is soft, incisions healing well.  Staples removed. 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     Assessment:    Doing well postoperatively.    Plan:   Patient may resume his Celebrex and his iron supplementation.  He has to follow-up with Dr. Ellin Saba of oncology for further management and treatment.  May gradually resume normal activity.  He should avoid any heavy lifting over 25 pounds for the next 2 to 4 weeks.

## 2023-05-23 NOTE — Progress Notes (Signed)
[facility] [address], [place] [address]    Clinic Day:  05/24/2023  Referring physician: Rebekah Chesterfield, NP  Patient Care Team: Rebekah Chesterfield, NP as PCP - General (Internal Medicine) Bradley Fus, MD as PCP - Cardiology (Cardiology) Jena Gauss Bradley Friends, MD as Consulting Physician (Gastroenterology) Doreatha Massed, MD as Medical Oncologist (Medical Oncology) Therese Sarah, RN as Oncology Nurse Navigator (Medical Oncology)   ASSESSMENT & PLAN:   Assessment: 1.  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 - Received 2 units PRBC and 1 infusion of Ferrlecit to 50 mg.  Never had prior colonoscopy. - Colonoscopy (02/12/2023): Polypoid submucosal ulcerated nonobstructing large mass in the cecum, partially circumferential. - EGD with (02/12/2023): 6 cm hiatal hernia.  Normal duodenal bulb, first part and second part of duodenum. - Pathology: Cecal mass biopsy-adenocarcinoma - CT CAP (02/16/2023): Subtle hypodensity within the central segment 4 measuring 14 mm in the liver.  Prominent lymph node in the ileocolic mesentery measures 4 mm.  No other evidence of metastatic disease. - MRI liver (02/25/2023): No solid liver abnormality seen. - Right partial colectomy with terminal ileum resection on 04/27/2023 - Pathology: Moderately differentiated invasive carcinoma, margins negative, negative LVI/PNI, 0/21 lymph nodes involved, pT3 N0, MMR preserved   2.  Social/family history: - Lives at home with his wife.  He had Guillain-Barr syndrome at age 23.  He has bilateral knee pains and uses a cane to ambulate.  He is independent of ADLs and IADLs.  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  Quit smoking cigarettes more than 30 years ago. - Brother had colon cancer.  Father had abdominal cancer.  Nephew died of cancer.  Maternal grandfather had leukemia.    Plan: 1.  Stage II (T3 N0) cecal  adenocarcinoma: - I have reviewed MRI results which did not show metastatic disease.  CEA on 02/21/2023 was 1.8. - We reviewed pathology report from 04/27/2023 in detail.  As it was lymph node negative stage II cancer, I did not recommend adjuvant chemotherapy. - We talked about Signatera testing for surveillance.  He is agreeable. - We have also discussed surveillance plan with blood work, CEA, Signatera every 3 months and CT imaging with abdomen and pelvis every 6 months for the first 2 years. - RTC 3 months with CTAP with contrast, CEA and LFTs.   2.  Iron deficiency anemia: - He has stopped taking iron tablet daily.  He received Venofer 500 mg 1 dose on 02/27/2023. - His last hemoglobin improved to 12.1. - Plan to repeat ferritin and iron panel at next visit in 3 months.    Orders Placed This Encounter  Procedures   CT Abdomen Pelvis W Contrast    Standing Status:   Future    Standing Expiration Date:   05/23/2024    Order Specific Question:   If indicated for the ordered procedure, I authorize the administration of contrast media per Radiology protocol    Answer:   Yes    Order Specific Question:   Does the patient have a contrast media/X-ray dye allergy?    Answer:   No    Order Specific Question:   Preferred imaging location?    Answer:   [facility]    Order Specific Question:   Release to patient    Answer:   Immediate [1]    Order Specific Question:   If indicated  for the ordered procedure, I authorize the administration of oral contrast media per Radiology protocol    Answer:   Yes   CEA    Standing Status:   Future    Standing Expiration Date:   05/23/2024   Comprehensive metabolic panel    Standing Status:   Future    Standing Expiration Date:   05/23/2024    Order Specific Question:   Release to patient    Answer:   Immediate   CBC    Standing Status:   Future    Standing Expiration Date:   05/23/2024   Ferritin    Standing Status:   Future    Standing Expiration  Date:   05/23/2024    Order Specific Question:   Release to patient    Answer:   Immediate   Iron and TIBC    Standing Status:   Future    Standing Expiration Date:   05/23/2024    Order Specific Question:   Release to patient    Answer:   Immediate   Signatera    Select as applicable. If patient is on or planning to receive immunotherapy, select drug: Not on Immunotherapy If "Other or Multiple", Write down drug name:  Do not Delete Below This Line   ==========Department Information========== ID: [id number] Department:[facility] [address] [id number] [facility] [place] [address] Dept: [phone] Dept Fax: [phone]    Standing Status:   Future    Number of Occurrences:   1    Standing Expiration Date:   05/23/2024    Order Specific Question:   Avelina Laine to follow up with patient for sample collection (mobile phleb, lab, or saliva):    Answer:   No    Order Specific Question:   Surveillance Program draw frequency:    Answer:   Every 3 Months    Order Specific Question:   Surveillance Program draw count:    Answer:   4    Order Specific Question:   Cancer type:    Answer:   Colon    Order Specific Question:   Stage of diagnosis:    Answer:   II    Order Specific Question:   History of recurrence?    Answer:   No    Order Specific Question:   Current disease status:    Answer:   Active disease    Order Specific Question:   Date of surgery (MM/DD/YYYY):    Answer:   04/27/2023    Order Specific Question:   Is the patient receiving or planning to receive immunotherapy?    Answer:   No    Order Specific Question:   Patient status:    Answer:   Outpatient    Order Specific Question:   Most recent progress/clinical note attached?    Answer:   Yes    Order Specific Question:   Pathology report attached?    Answer:   Yes    Order Specific Question:   Pathology institution name:    Answer:   [facility]     Order Specific Question:   Pathology institution address and fax number:    Answer:   [phone]    Order Specific Question:   Tissue collection date (MM/DD/YYYY):    Answer:   04/27/2023    Order Specific Question:   By placing this electronic order I confirm the testing ordered herein is medically necessary and this patient has  been informed of the details of the genetic test(s) ordered, including the risks, benefits, and alternatives, and has consented to testing.    Answer:   Yes    Order Specific Question:   What type of billing?    Answer:   Engineer, maintenance (IT) Specific Question:   Release to patient    Answer:   Immediate [1]      I,Katie Daubenspeck,acting as a scribe for Doreatha Massed, MD.,have documented all relevant documentation on the behalf of Doreatha Massed, MD,as directed by  Doreatha Massed, MD while in the presence of Doreatha Massed, MD.   I, Doreatha Massed MD, have reviewed the above documentation for accuracy and completeness, and I agree with the above.   Doreatha Massed, MD   6/6/20245:02 PM  CHIEF COMPLAINT:   Diagnosis: Cecal adenocarcinoma    Cancer Staging  Cecal cancer [facility]) Staging form: Colon and Rectum, AJCC 8th Edition - Clinical stage from 02/21/2023: Stage IIA (cT3, cN0, cM0) - Unsigned    Prior Therapy: Right hemicolectomy  Current Therapy: Observation   HISTORY OF PRESENT ILLNESS:   Oncology History   No history exists.     INTERVAL HISTORY:   Semaj is a 72 y.o. male presenting to clinic today for follow up of Cecal adenocarcinoma. He was last seen by me on 02/21/23.  Since his last visit, he proceeded with right hemicolectomy on 04/27/23 under Dr. Lovell Sheehan. Pathology from the procedure showed: 3.2 cm moderately differentiated invasive adenocarcinoma; carcinoma invades pericolonic soft tissue; surgical margins negative; all 21 lymph nodes negative for carcinoma.  Today, he states that he is doing well  overall. His appetite level is at 100%. His energy level is at 75%.  PAST MEDICAL HISTORY:   Past Medical History: Past Medical History:  Diagnosis Date   Arthritis    Dysrhythmia    GERD (gastroesophageal reflux disease)    Guillain-Barre syndrome (HCC)    History of hiatal hernia    Hypertension    Seasonal allergies     Surgical History: Past Surgical History:  Procedure Laterality Date   ANTERIOR CERVICAL DECOMP/DISCECTOMY FUSION N/A 04/23/2013   Procedure: ANTERIOR CERVICAL DECOMPRESSION/DISCECTOMY FUSION 3 LEVELS;  Surgeon: Karn Cassis, MD;  Location: [facility];  Service: Neurosurgery;  Laterality: N/A;  Cervical three-four, cervical four-five, cervical five-six Anterior cervical decompression/diskectomy/fusion   APPENDECTOMY     BIOPSY  02/12/2023   Procedure: BIOPSY;  Surgeon: Lanelle Bal, DO;  Location: [facility];  Service: Endoscopy;;   CERVICAL FUSION     C7   COLONOSCOPY WITH PROPOFOL N/A 02/12/2023   Procedure: COLONOSCOPY WITH PROPOFOL;  Surgeon: Lanelle Bal, DO;  Location: [facility];  Service: Endoscopy;  Laterality: N/A;  12:30pm, asa 3   ESOPHAGOGASTRODUODENOSCOPY (EGD) WITH PROPOFOL N/A 02/12/2023   Procedure: ESOPHAGOGASTRODUODENOSCOPY (EGD) WITH PROPOFOL;  Surgeon: Lanelle Bal, DO;  Location: [facility];  Service: Endoscopy;  Laterality: N/A;   POLYPECTOMY  02/12/2023   Procedure: POLYPECTOMY INTESTINAL;  Surgeon: Lanelle Bal, DO;  Location: [facility];  Service: Endoscopy;;    Social History: Social History   Socioeconomic History   Marital status: Married    Spouse name: Not on file   Number of children: Not on file   Years of education: Not on file   Highest education level: Not on file  Occupational History   Not on file  Tobacco Use   Smoking status: Former   Smokeless tobacco: Never  Vaping Use   Vaping Use: Former  Substance and Sexual Activity   Alcohol use: Yes    Alcohol/week: 1.0 standard drink  of alcohol    Types: 1 Cans of beer per week    Comment: socially   Drug use: No   Sexual activity: Not on file  Other Topics Concern   Not on file  Social History Narrative   Not on file   Social Determinants of Health   Financial Resource Strain: Not on file  Food Insecurity: No Food Insecurity (04/27/2023)   Hunger Vital Sign    Worried About Running Out of Food in the Last Year: Never true    Ran Out of Food in the Last Year: Never true  Transportation Needs: No Transportation Needs (04/27/2023)   PRAPARE - Administrator, Civil Service (Medical): No    Lack of Transportation (Non-Medical): No  Physical Activity: Not on file  Stress: Not on file  Social Connections: Not on file  Intimate Partner Violence: Not At Risk (04/27/2023)   Humiliation, Afraid, Rape, and Kick questionnaire    Fear of Current or Ex-Partner: No    Emotionally Abused: No    Physically Abused: No    Sexually Abused: No    Family History: No family history on file.  Current Medications:  Current Outpatient Medications:    acetaminophen (TYLENOL) 500 MG tablet, Take 1,000 mg by mouth every 6 (six) hours as needed for moderate pain or mild pain., Disp: , Rfl:    celecoxib (CELEBREX) 200 MG capsule, Take 200 mg by mouth every other day., Disp: , Rfl:    Cholecalciferol (VITAMIN D3) 250 MCG (10000 UT) capsule, Take 10,000 Units by mouth every other day., Disp: , Rfl:    diphenhydrAMINE (BENADRYL) 25 MG tablet, Take 25 mg by mouth every 6 (six) hours as needed for sleep., Disp: , Rfl:    Glycerin-Hypromellose-PEG 400 (VISINE DRY EYE) 0.2-0.2-1 % SOLN, Place 1 drop into both eyes 3 (three) times daily as needed (dry/irritated eyes.)., Disp: , Rfl:    loratadine (CLARITIN) 10 MG tablet, Take 10 mg by mouth daily as needed for allergies., Disp: , Rfl:    Multiple Vitamin (MULTIVITAMIN WITH MINERALS) TABS, Take 1 tablet by mouth daily. Centrum, Disp: , Rfl:    Omega-3 Fatty Acids (FISH OIL) 1200 MG  CAPS, Take 1,200 mg by mouth daily., Disp: , Rfl:    omeprazole (PRILOSEC) 20 MG capsule, Take 20 mg by mouth daily., Disp: , Rfl:    oxyCODONE-acetaminophen (PERCOCET/ROXICET) 5-325 MG tablet, Take 1-2 tablets by mouth every 4 (four) hours as needed for severe pain. (Patient taking differently: Take 1 tablet by mouth every 8 (eight) hours as needed for severe pain.), Disp: 60 tablet, Rfl: 0   Oxymetazoline HCl (SINEX LONG-ACTING NA), Place 1 spray into the nose 2 (two) times daily as needed (congestion)., Disp: , Rfl:    Probiotic CAPS, Take 1 capsule by mouth daily., Disp: , Rfl:    zinc gluconate 50 MG tablet, Take 50 mg by mouth daily., Disp: , Rfl:    Allergies: Allergies  Allergen Reactions   Penicillins Rash    Tolerated cefotetan on 04/27/23    REVIEW OF SYSTEMS:   Review of Systems  Constitutional:  Negative for chills, fatigue and fever.  HENT:   Negative for lump/mass, mouth sores, nosebleeds, sore throat and trouble swallowing.   Eyes:  Negative for eye problems.  Respiratory:  Negative for cough and shortness of breath.  Cardiovascular:  Negative for chest pain, leg swelling and palpitations.  Gastrointestinal:  Negative for abdominal pain, constipation, diarrhea, nausea and vomiting.  Genitourinary:  Negative for bladder incontinence, difficulty urinating, dysuria, frequency, hematuria and nocturia.   Musculoskeletal:  Negative for arthralgias, back pain, flank pain, myalgias and neck pain.  Skin:  Negative for itching and rash.  Neurological:  Negative for dizziness, headaches and numbness.  Hematological:  Does not bruise/bleed easily.  Psychiatric/Behavioral:  Negative for depression, sleep disturbance and suicidal ideas. The patient is not nervous/anxious.   All other systems reviewed and are negative.    VITALS:   Blood pressure 126/87, pulse 91, temperature 97.6 F (36.4 C), resp. rate 18, weight 168 lb 14 oz (76.6 kg), SpO2 95 %.  Wt Readings from Last 3  Encounters:  05/24/23 168 lb 14 oz (76.6 kg)  05/08/23 166 lb (75.3 kg)  04/27/23 169 lb 12.1 oz (77 kg)    Body mass index is 28.99 kg/m.  Performance status (ECOG): 1 - Symptomatic but completely ambulatory  PHYSICAL EXAM:   Physical Exam Vitals and nursing note reviewed. Exam conducted with a chaperone present.  Constitutional:      Appearance: Normal appearance.  Cardiovascular:     Rate and Rhythm: Normal rate and regular rhythm.     Pulses: Normal pulses.     Heart sounds: Normal heart sounds.  Pulmonary:     Effort: Pulmonary effort is normal.     Breath sounds: Normal breath sounds.  Abdominal:     Palpations: Abdomen is soft. There is no hepatomegaly, splenomegaly or mass.     Tenderness: There is no abdominal tenderness.  Musculoskeletal:     Right lower leg: No edema.     Left lower leg: No edema.  Lymphadenopathy:     Cervical: No cervical adenopathy.     Right cervical: No superficial, deep or posterior cervical adenopathy.    Left cervical: No superficial, deep or posterior cervical adenopathy.     Upper Body:     Right upper body: No supraclavicular or axillary adenopathy.     Left upper body: No supraclavicular or axillary adenopathy.  Neurological:     General: No focal deficit present.     Mental Status: He is alert and oriented to person, place, and time.  Psychiatric:        Mood and Affect: Mood normal.        Behavior: Behavior normal.     LABS:      Latest Ref Rng & Units 04/29/2023    2:39 PM 04/29/2023    4:36 AM 04/28/2023    5:27 AM  CBC  WBC 4.0 - 10.5 K/uL 7.8  7.3  7.5   Hemoglobin 13.0 - 17.0 g/dL 21.3  08.6  57.8   Hematocrit 39.0 - 52.0 % 38.6  37.6  38.7   Platelets 150 - 400 K/uL 223  210  225       Latest Ref Rng & Units 04/30/2023    5:11 AM 04/29/2023    4:36 AM 04/28/2023    5:27 AM  CMP  Glucose 70 - 99 mg/dL 92  469  99   BUN 8 - 23 mg/dL 10  10  14    Creatinine 0.61 - 1.24 mg/dL 6.29  5.28  4.13   Sodium 135 - 145  mmol/L 137  135  135   Potassium 3.5 - 5.1 mmol/L 4.0  3.7  3.9   Chloride 98 - 111 mmol/L 106  105  104   CO2 22 - 32 mmol/L 24  24  26    Calcium 8.9 - 10.3 mg/dL 8.9  8.9  8.9      Lab Results  Component Value Date   CEA1 1.8 02/21/2023   /  CEA  Date Value Ref Range Status  02/21/2023 1.8 0.0 - 4.7 ng/mL Final    Comment:    (NOTE)                             Nonsmokers          <3.9                             Smokers             <5.6 Roche [employer] Electrochemiluminescence Immunoassay (ECLIA) Values obtained with different assay methods or kits cannot be used interchangeably.  Results cannot be interpreted as absolute evidence of the presence or absence of malignant disease. Performed At: [facility] [address] [place], [place] [id number] Jolene Schimke MD WU:[id number]    No results found for: "PSA1" No results found for: "CAN199" No results found for: "CAN125"  No results found for: "TOTALPROTELP", "ALBUMINELP", "A1GS", "A2GS", "BETS", "BETA2SER", "GAMS", "MSPIKE", "SPEI" Lab Results  Component Value Date   TIBC 454 (H) 02/08/2023   TIBC 423 02/07/2023   FERRITIN 5 (L) 02/08/2023   FERRITIN 5 (L) 02/07/2023   IRONPCTSAT 2 (L) 02/08/2023   IRONPCTSAT 3 (L) 02/07/2023   No results found for: "LDH"   STUDIES:   No results found.

## 2023-05-24 ENCOUNTER — Inpatient Hospital Stay: Payer: 59 | Attending: Hematology | Admitting: Hematology

## 2023-05-24 ENCOUNTER — Inpatient Hospital Stay: Payer: 59

## 2023-05-24 VITALS — BP 126/87 | HR 91 | Temp 97.6°F | Resp 18 | Wt 168.9 lb

## 2023-05-24 DIAGNOSIS — D509 Iron deficiency anemia, unspecified: Secondary | ICD-10-CM | POA: Diagnosis not present

## 2023-05-24 DIAGNOSIS — Z806 Family history of leukemia: Secondary | ICD-10-CM | POA: Diagnosis not present

## 2023-05-24 DIAGNOSIS — C180 Malignant neoplasm of cecum: Secondary | ICD-10-CM | POA: Diagnosis present

## 2023-05-24 DIAGNOSIS — Z800 Family history of malignant neoplasm of digestive organs: Secondary | ICD-10-CM | POA: Diagnosis not present

## 2023-05-24 DIAGNOSIS — Z87891 Personal history of nicotine dependence: Secondary | ICD-10-CM | POA: Insufficient documentation

## 2023-05-24 LAB — GENETIC SCREENING ORDER

## 2023-05-24 NOTE — Patient Instructions (Addendum)
[facility]  Discharge Instructions  You were seen and examined today by Dr. Ellin Saba.  Dr. Ellin Saba discussed your most recent lab work.   Dr. Ellin Saba is going to order Signatera to be drawn every three months to see if the cancer is trying to come back.   Follow-up as scheduled in 3 months.    Thank you for choosing [facility] to provide your oncology and hematology care.   To afford each patient quality time with our provider, please arrive at least 15 minutes before your scheduled appointment time. You may need to reschedule your appointment if you arrive late (10 or more minutes). Arriving late affects you and other patients whose appointments are after yours.  Also, if you miss three or more appointments without notifying the office, you may be dismissed from the clinic at the provider's discretion.    Again, thank you for choosing [facility].  Our hope is that these requests will decrease the amount of time that you wait before being seen by our physicians.   If you have a lab appointment with the Cancer Center - please note that after April 8th, all labs will be drawn in the cancer center.  You do not have to check in or register with the main entrance as you have in the past but will complete your check-in at the cancer center.            _____________________________________________________________  Should you have questions after your visit to [facility], please contact our office at [phone] and follow the prompts.  Our office hours are 8:00 a.m. to 4:30 p.m. Monday - Thursday and 8:00 a.m. to 2:30 p.m. Friday.  Please note that voicemails left after 4:00 p.m. may not be returned until the following business day.  We are closed weekends and all major holidays.  You do have access to a nurse 24-7, just call the main number to the clinic [phone] and do not press any options, hold on  the line and a nurse will answer the phone.    For prescription refill requests, have your pharmacy contact our office and allow 72 hours.    Masks are no longer required in the cancer centers. If you would like for your care team to wear a mask while they are taking care of you, please let them know. You may have one support person who is at least 71 years old accompany you for your appointments.

## 2023-06-15 ENCOUNTER — Encounter: Payer: Self-pay | Admitting: Internal Medicine

## 2023-06-15 ENCOUNTER — Ambulatory Visit: Payer: 59 | Attending: Internal Medicine | Admitting: Internal Medicine

## 2023-06-15 VITALS — BP 132/88 | HR 93 | Ht 64.0 in | Wt 168.8 lb

## 2023-06-15 DIAGNOSIS — I480 Paroxysmal atrial fibrillation: Secondary | ICD-10-CM

## 2023-06-15 MED ORDER — APIXABAN 5 MG PO TABS: 5.0000 mg | ORAL_TABLET | Freq: Two times a day (BID) | ORAL | 6 refills | Status: DC

## 2023-06-15 MED ORDER — DILTIAZEM HCL 30 MG PO TABS: 30.0000 mg | ORAL_TABLET | Freq: Four times a day (QID) | ORAL | 2 refills | Status: AC | PRN

## 2023-06-15 NOTE — Patient Instructions (Signed)
Medication Instructions:  Eliquis 5 mg  twice a day    Diltiazem 30 mg  may take every 6 hours as needed for elevated heart rate    *If you need a refill on your cardiac medications before your next appointment, please call your pharmacy*   Lab Work: Not needed    Testing/Procedures:  Not needed  Follow-Up: At [facility], you and your health needs are our priority.  As part of our continuing mission to provide you with exceptional heart care, we have created designated Provider Care Teams.  These Care Teams include your primary Cardiologist (physician) and Advanced Practice Providers (APPs -  Physician Assistants and Nurse Practitioners) who all work together to provide you with the care you need, when you need it.     Your next appointment:   1 month(s)  The format for your next appointment:   In Person  Provider:   [name], MD    Other Instructions

## 2023-06-15 NOTE — Progress Notes (Signed)
Cardiology Office Note:    Date:  06/15/2023   ID:  Bradley Trujillo, DOB 21-Dec-1950, MRN [id number]  PCP:  Rebekah Chesterfield, NP   [facility] Providers Cardiologist:  [name], MD     Referring MD: Rebekah Chesterfield, NP   No chief complaint on file. Pre-op  History of Present Illness:   Initial Visit Bradley Trujillo is a 72 y.o. male with a hx of arthritis, GERD, Guillain-barre, HTN, cecal adenocarcinoma, robotic assisted laparoscopic partial colectomy referral for pre-op, noted to be in afib. EKG 03/06/2023 shows rate controlled atrial fibrillation. Initially found to have signfiicant anemia hgb 6.4 on 02/07/2023. He received PRBC transfusion. He has received iron. Colonoscopy on 02/12/2023 + Cecal mass biopsy-adenocarcinoma. CT AP-Subtle hypodensity within the central segment 4 measuring 14 mm in the liver.   He was noted to be in afib when getting his transfusion. At pre-op afib was identified again.  He denies palpitations. He can tell if it is irregular. He notes this seems to have been long standing. Stool is dark. No BRBPR with no hx. Has leg and knee pain 2/2 Guillan Barre. Denies CP or SOB with his chores. He notes a prior stress test many years ago in the past for his job. That was normal. No cardiovascular dx hx otherwise.  Blood pressure is well controlled. No hx of OSA in the chart.   Social Hx: lives at home with his wife.Worked as a Chartered certified accountant for [employer]. Quite smoking > 30 years ago  Family Hx: no premature CAD. No cardiac dx  Interim hx 06/15/2023 He underwent robotic assisted laparoscopic right hemicolectomy on 04/27/2023. He tolerated the procedure well. He does not require cancer therapy with good margins. His hgb is now stable. He does note persistent palpitations. He denies DOE.  Past Medical History:  Diagnosis Date   Arthritis    Dysrhythmia    GERD (gastroesophageal reflux disease)    Guillain-Barre syndrome (HCC)    History of hiatal hernia     Hypertension    Seasonal allergies     Past Surgical History:  Procedure Laterality Date   ANTERIOR CERVICAL DECOMP/DISCECTOMY FUSION N/A 04/23/2013   Procedure: ANTERIOR CERVICAL DECOMPRESSION/DISCECTOMY FUSION 3 LEVELS;  Surgeon: Karn Cassis, MD;  Location: [facility];  Service: Neurosurgery;  Laterality: N/A;  Cervical three-four, cervical four-five, cervical five-six Anterior cervical decompression/diskectomy/fusion   APPENDECTOMY     BIOPSY  02/12/2023   Procedure: BIOPSY;  Surgeon: Lanelle Bal, DO;  Location: [facility];  Service: Endoscopy;;   CERVICAL FUSION     C7   COLONOSCOPY WITH PROPOFOL N/A 02/12/2023   Procedure: COLONOSCOPY WITH PROPOFOL;  Surgeon: Lanelle Bal, DO;  Location: [facility];  Service: Endoscopy;  Laterality: N/A;  12:30pm, asa 3   ESOPHAGOGASTRODUODENOSCOPY (EGD) WITH PROPOFOL N/A 02/12/2023   Procedure: ESOPHAGOGASTRODUODENOSCOPY (EGD) WITH PROPOFOL;  Surgeon: Lanelle Bal, DO;  [facility]: [facility];  Service: Endoscopy;  Laterality: N/A;   POLYPECTOMY  02/12/2023   Procedure: POLYPECTOMY INTESTINAL;  Surgeon: Lanelle Bal, DO;  [facility]: [facility];  Service: Endoscopy;;    Current Medications: Current Outpatient Medications on File Prior to Visit  Medication Sig Dispense Refill   acetaminophen (TYLENOL) 500 MG tablet Take 1,000 mg by mouth every 6 (six) hours as needed for moderate pain or mild pain.     celecoxib (CELEBREX) 200 MG capsule Take 200 mg by mouth every other day.     Cholecalciferol (  VITAMIN D3) 250 MCG (10000 UT) capsule Take 10,000 Units by mouth every other day.     diphenhydrAMINE (BENADRYL) 25 MG tablet Take 25 mg by mouth every 6 (six) hours as needed for sleep.     Glycerin-Hypromellose-PEG 400 (VISINE DRY EYE) 0.2-0.2-1 % SOLN Place 1 drop into both eyes 3 (three) times daily as needed (dry/irritated eyes.).     loratadine (CLARITIN) 10 MG tablet Take 10 mg by mouth daily as needed for allergies.      Multiple Vitamin (MULTIVITAMIN WITH MINERALS) TABS Take 1 tablet by mouth daily. Centrum     Omega-3 Fatty Acids (FISH OIL) 1200 MG CAPS Take 1,200 mg by mouth daily.     omeprazole (PRILOSEC) 20 MG capsule Take 20 mg by mouth daily.     oxyCODONE-acetaminophen (PERCOCET/ROXICET) 5-325 MG tablet Take 1-2 tablets by mouth every 4 (four) hours as needed for severe pain. (Patient taking differently: Take 1 tablet by mouth every 8 (eight) hours as needed for severe pain.) 60 tablet 0   Oxymetazoline HCl (SINEX LONG-ACTING NA) Place 1 spray into the nose 2 (two) times daily as needed (congestion).     Probiotic CAPS Take 1 capsule by mouth daily.     zinc gluconate 50 MG tablet Take 50 mg by mouth daily.     No current facility-administered medications on file prior to visit.    Allergies:   Penicillins   Social History   Socioeconomic History   Marital status: Married    Spouse name: Not on file   Number of children: Not on file   Years of education: Not on file   Highest education level: Not on file  Occupational History   Not on file  Tobacco Use   Smoking status: Former   Smokeless tobacco: Never  Vaping Use   Vaping Use: Former  Substance and Sexual Activity   Alcohol use: Yes    Alcohol/week: 1.0 standard drink of alcohol    Types: 1 Cans of beer per week    Comment: socially   Drug use: No   Sexual activity: Not on file  Other Topics Concern   Not on file  Social History Narrative   Not on file   Social Determinants of Health   Financial Resource Strain: Not on file  Food Insecurity: No Food Insecurity (04/27/2023)   Hunger Vital Sign    Worried About Running Out of Food in the Last Year: Never true    Ran Out of Food in the Last Year: Never true  Transportation Needs: No Transportation Needs (04/27/2023)   PRAPARE - Administrator, Civil Service (Medical): No    Lack of Transportation (Non-Medical): No  Physical Activity: Not on file  Stress: Not on  file  Social Connections: Not on file     Family History: Per above  ROS:   Please see the history of present illness.     All other systems reviewed and are negative.  EKGs/Labs/Other Studies Reviewed:    The following studies were reviewed today:  TTE 04/12/2023 EF 60-65%, nl RV LA size 66 ml/m2 No sig valve dx    EKG:  EKG is  ordered today.  The ekg ordered today demonstrates   EKG 03/13/2023- atrial fibrillation rate 83 bpm  EKG Interpretation Date/Time:  Friday June 15 2023 13:57:52 EDT Ventricular Rate:  93 PR Interval:    QRS Duration:  80 QT Interval:  362 QTC Calculation: 450 R Axis:   61  Text Interpretation: Atrial fibrillation Low voltage QRS When compared with ECG of 06-Mar-2023 15:01, No significant change was found Confirmed by Carolan Clines [phone]) on 06/15/2023 2:12:11 PM   Recent Labs: 03/13/2023: TSH 1.490 04/24/2023: ALT 17 04/29/2023: Hemoglobin 12.1; Platelets 223 04/30/2023: BUN 10; Creatinine, Ser 1.11; Magnesium 2.3; Potassium 4.0; Sodium 137   Recent Lipid Panel No results found for: "CHOL", "TRIG", "HDL", "CHOLHDL", "VLDL", "LDLCALC", "LDLDIRECT"   Risk Assessment/Calculations:    CHA2DS2-VASc Score = 2   This indicates a 2.2% annual risk of stroke. The patient's score is based upon: CHF History: 0 HTN History: 1 Diabetes History: 0 Stroke History: 0 Vascular Disease History: 0 Age Score: 1 Gender Score: 0           Physical Exam:    VS:   Vitals:   06/15/23 1353  BP: 132/88  Pulse: 93  SpO2: 95%     Wt Readings from Last 3 Encounters:  05/24/23 168 lb 14 oz (76.6 kg)  05/08/23 166 lb (75.3 kg)  04/27/23 169 lb 12.1 oz (77 kg)     GEN:  Well nourished, well developed in no acute distress HEENT: Normal NECK: No JVD CARDIAC: IRRR, no murmurs, rubs, gallops RESPIRATORY:  Clear to auscultation without rales, wheezing or rhonchi  ABDOMEN: non-distended MUSCULOSKELETAL:  No edema; No deformity  SKIN: Warm and  dry NEUROLOGIC:  Alert and oriented x 3 PSYCHIATRIC:  Normal affect   ASSESSMENT:    New Onset Persistent Atrial Fibrillation: Diagnosed 03/06/2023. Chads2vasc =2. Cancer can increase risk of ischemic stroke and chads2vasc may underestimate this risk. Recommended starting anticoagulation after his surgery. He underwent robotic assisted laparoscopic partial colectomy successfully in May.  His hgb was 6.3 prior to surgery. It is now 12.1 g/dL and stable. - in rate controlled atrial fibrillation , mostly asymptomatic - not a good PVI candidate with his LA size and age.   PLAN:    In order of problems listed above:  Will start eliquis 5 mg BID Will discuss DCCV in 1 month if he can tolerate AC without bleeding or drop in hgb Diltiazem 30 mg Q6H PRN Follow up 1 month     Medication Adjustments/Labs and Tests Ordered: Current medicines are reviewed at length with the patient today.  Concerns regarding medicines are outlined above.  Orders Placed This Encounter  Procedures   EKG 12-Lead   No orders of the defined types were placed in this encounter.   There are no Patient Instructions on file for this visit.   Signed, [name], MD  06/15/2023 1:53 PM    [facility]

## 2023-07-05 LAB — SIGNATERA
SIGNATERA MTM READOUT: 0 MTM/ml
SIGNATERA TEST RESULT: NEGATIVE

## 2023-07-11 ENCOUNTER — Ambulatory Visit: Payer: 59 | Attending: Internal Medicine | Admitting: Internal Medicine

## 2023-07-11 ENCOUNTER — Encounter: Payer: Self-pay | Admitting: Internal Medicine

## 2023-07-11 VITALS — BP 140/90 | HR 89 | Ht 64.0 in | Wt 169.0 lb

## 2023-07-11 DIAGNOSIS — Z01818 Encounter for other preprocedural examination: Secondary | ICD-10-CM | POA: Diagnosis not present

## 2023-07-11 DIAGNOSIS — I480 Paroxysmal atrial fibrillation: Secondary | ICD-10-CM

## 2023-07-11 NOTE — H&P (View-Only) (Signed)
Cardiology Office Note:    Date:  07/11/2023   ID:  Bradley Trujillo, DOB [birth year], MRN [id number]  PCP:  Bradley Chesterfield, NP   [facility] Providers Cardiologist:  Bradley Fus, MD     Referring MD: Bradley Chesterfield, NP   No chief complaint on file. Pre-op  History of Present Illness:   Initial Visit Bradley Trujillo is a 72 y.o. male with a hx of arthritis, GERD, Guillain-barre, HTN, cecal adenocarcinoma, robotic assisted laparoscopic partial colectomy referral for pre-op, noted to be in afib. EKG 03/06/2023 shows rate controlled atrial fibrillation. Initially found to have signfiicant anemia hgb 6.4 on 02/07/2023. He received PRBC transfusion. He has received iron. Colonoscopy on 02/12/2023 + Cecal mass biopsy-adenocarcinoma. CT AP-Subtle hypodensity within the central segment 4 measuring 14 mm in the liver.   He was noted to be in afib when getting his transfusion. At pre-op afib was identified again.  He denies palpitations. He can tell if it is irregular. He notes this seems to have been long standing. Stool is dark. No BRBPR with no hx. Has leg and knee pain 2/2 Guillan Barre. Denies CP or SOB with his chores. He notes a prior stress test many years ago in the past for his job. That was normal. No cardiovascular dx hx otherwise.  Blood pressure is well controlled. No hx of OSA in the chart.   Social Hx: lives at home with his wife.Worked as a Chartered certified accountant for [employer]. Quite smoking > 30 years ago  Family Hx: no premature CAD. No cardiac dx  Interim hx 06/15/2023 He underwent robotic assisted laparoscopic right hemicolectomy on 04/27/2023. He tolerated the procedure well. He does not require cancer therapy with good margins. His hgb is now stable. He does note persistent palpitations. He denies DOE.  Interim hx 07/11/2023   Past Medical History:  Diagnosis Date   Arthritis    Dysrhythmia    GERD (gastroesophageal reflux disease)    Guillain-Barre syndrome (HCC)     History of hiatal hernia    Hypertension    Seasonal allergies     Past Surgical History:  Procedure Laterality Date   ANTERIOR CERVICAL DECOMP/DISCECTOMY FUSION N/A 04/23/2013   Procedure: ANTERIOR CERVICAL DECOMPRESSION/DISCECTOMY FUSION 3 LEVELS;  Surgeon: Bradley Cassis, MD;  Location: [facility];  Service: Neurosurgery;  Laterality: N/A;  Cervical three-four, cervical four-five, cervical five-six Anterior cervical decompression/diskectomy/fusion   APPENDECTOMY     BIOPSY  02/12/2023   Procedure: BIOPSY;  Surgeon: Bradley Bal, DO;  [facility]: [facility];  Service: Endoscopy;;   CERVICAL FUSION     C7   COLONOSCOPY WITH PROPOFOL N/A 02/12/2023   Procedure: COLONOSCOPY WITH PROPOFOL;  Surgeon: Bradley Bal, DO;  [facility]: [facility];  Service: Endoscopy;  Laterality: N/A;  12:30pm, asa 3   ESOPHAGOGASTRODUODENOSCOPY (EGD) WITH PROPOFOL N/A 02/12/2023   Procedure: ESOPHAGOGASTRODUODENOSCOPY (EGD) WITH PROPOFOL;  Surgeon: Bradley Bal, DO;  [facility];  Service: Endoscopy;  Laterality: N/A;   POLYPECTOMY  02/12/2023   Procedure: POLYPECTOMY INTESTINAL;  Surgeon: Bradley Bal, DO;  [facility];  Service: Endoscopy;;    Current Medications: Current Outpatient Medications on File Prior to Visit  Medication Sig Dispense Refill   acetaminophen (TYLENOL) 500 MG tablet Take 1,000 mg by mouth every 6 (six) hours as needed for moderate pain or mild pain.     apixaban (ELIQUIS) 5 MG TABS tablet Take 1 tablet (5 mg total) by  mouth 2 (two) times daily. 60 tablet 6   celecoxib (CELEBREX) 200 MG capsule Take 200 mg by mouth every other day.     Cholecalciferol (VITAMIN D3) 250 MCG (10000 UT) capsule Take 10,000 Units by mouth every other day.     diltiazem (CARDIZEM) 30 MG tablet Take 1 tablet (30 mg total) by mouth every 6 (six) hours as needed. 40 tablet 2   diphenhydrAMINE (BENADRYL) 25 MG tablet Take 25 mg by mouth every 6 (six) hours as needed for  sleep.     Glycerin-Hypromellose-PEG 400 (VISINE DRY EYE) 0.2-0.2-1 % SOLN Place 1 drop into both eyes 3 (three) times daily as needed (dry/irritated eyes.).     loratadine (CLARITIN) 10 MG tablet Take 10 mg by mouth daily as needed for allergies.     Multiple Vitamin (MULTIVITAMIN WITH MINERALS) TABS Take 1 tablet by mouth daily. Centrum     Omega-3 Fatty Acids (FISH OIL) 1200 MG CAPS Take 1,200 mg by mouth daily.     omeprazole (PRILOSEC) 20 MG capsule Take 20 mg by mouth daily.     oxyCODONE-acetaminophen (PERCOCET/ROXICET) 5-325 MG tablet Take 1-2 tablets by mouth every 4 (four) hours as needed for severe pain. (Patient taking differently: Take 1 tablet by mouth every 8 (eight) hours as needed for severe pain.) 60 tablet 0   Oxymetazoline HCl (SINEX LONG-ACTING NA) Place 1 spray into the nose 2 (two) times daily as needed (congestion).     Probiotic CAPS Take 1 capsule by mouth daily.     zinc gluconate 50 MG tablet Take 50 mg by mouth daily.     No current facility-administered medications on file prior to visit.    Allergies:   Penicillins   Social History   Socioeconomic History   Marital status: Married    Spouse name: Not on file   Number of children: Not on file   Years of education: Not on file   Highest education level: Not on file  Occupational History   Not on file  Tobacco Use   Smoking status: Former   Smokeless tobacco: Never  Vaping Use   Vaping status: Former  Substance and Sexual Activity   Alcohol use: Yes    Alcohol/week: 1.0 standard drink of alcohol    Types: 1 Cans of beer per week    Comment: socially   Drug use: No   Sexual activity: Not on file  Other Topics Concern   Not on file  Social History Narrative   Not on file   Social Determinants of Health   Financial Resource Strain: Not on file  Food Insecurity: No Food Insecurity (04/27/2023)   Hunger Vital Sign    Worried About Running Out of Food in the Last Year: Never true    Ran Out of  Food in the Last Year: Never true  Transportation Needs: No Transportation Needs (04/27/2023)   PRAPARE - Administrator, Civil Service (Medical): No    Lack of Transportation (Non-Medical): No  Physical Activity: Not on file  Stress: Not on file  Social Connections: Not on file     Family History: Per above  ROS:   Please see the history of present illness.     All other systems reviewed and are negative.  EKGs/Labs/Other Studies Reviewed:    The following studies were reviewed today:  TTE 04/12/2023 EF 60-65%, nl RV LA size 66 ml/m2 No sig valve dx   EKG:  EKG is  ordered today.  The ekg ordered today demonstrates   EKG 03/13/2023- atrial fibrillation rate 83 bpm  EKG Interpretation Date/Time:  Wednesday July 11 2023 13:14:10 EDT Ventricular Rate:  89 PR Interval:    QRS Duration:  80 QT Interval:  362 QTC Calculation: 440 R Axis:   64  Text Interpretation: Atrial fibrillation When compared with ECG of 15-Jun-2023 13:57, No significant change was found Confirmed by Carolan Clines [id number]) on 07/11/2023 3:33:34 PM   Recent Labs: 03/13/2023: TSH 1.490 04/24/2023: ALT 17 04/29/2023: Hemoglobin 12.1; Platelets 223 04/30/2023: BUN 10; Creatinine, Ser 1.11; Magnesium 2.3; Potassium 4.0; Sodium 137   Recent Lipid Panel No results found for: "CHOL", "TRIG", "HDL", "CHOLHDL", "VLDL", "LDLCALC", "LDLDIRECT"   Risk Assessment/Calculations:    CHA2DS2-VASc Score = 2   This indicates a 2.2% annual risk of stroke. The patient's score is based upon: CHF History: 0 HTN History: 1 Diabetes History: 0 Stroke History: 0 Vascular Disease History: 0 Age Score: 1 Gender Score: 0           Physical Exam:    VS:   Vitals:   07/11/23 1316  BP: (!) 140/90  Pulse: 89  SpO2: 98%      Wt Readings from Last 3 Encounters:  06/15/23 168 lb 12.8 oz (76.6 kg)  05/24/23 168 lb 14 oz (76.6 kg)  05/08/23 166 lb (75.3 kg)     GEN:  Well nourished, well developed in  no acute distress HEENT: Normal NECK: No JVD CARDIAC: IRRR, no murmurs, rubs, gallops RESPIRATORY:  Clear to auscultation without rales, wheezing or rhonchi  ABDOMEN: non-distended MUSCULOSKELETAL:  No edema; No deformity  SKIN: Warm and dry NEUROLOGIC:  Alert and oriented x 3 PSYCHIATRIC:  Normal affect   ASSESSMENT:    New Onset Persistent Atrial Fibrillation: Diagnosed 03/06/2023. Chads2vasc =2. Cancer can increase risk of ischemic stroke and chads2vasc may underestimate this risk. Recommended starting anticoagulation after his surgery. He underwent robotic assisted laparoscopic partial colectomy successfully in May. His hgb was 6.3 prior to surgery. It is now 12.1 g/dL and stable. - in rate controlled atrial fibrillation today , mostly asymptomatic, but has low energy - not a good PVI candidate with his LA size and age.  PLAN:    In order of problems listed above:  Continue eliquis 5 mg BID Plan for DCCV [orders for surgery signed] Continue Diltiazem 30 mg Q6H PRN Follow up 6 months      Medication Adjustments/Labs and Tests Ordered: Current medicines are reviewed at length with the patient today.  Concerns regarding medicines are outlined above.  No orders of the defined types were placed in this encounter.  No orders of the defined types were placed in this encounter.   There are no Patient Instructions on file for this visit.   Signed, Bradley Fus, MD  07/11/2023 11:53 AM    [facility]

## 2023-07-11 NOTE — Progress Notes (Signed)
Cardiology Office Note:    Date:  07/11/2023   ID:  Bradley Trujillo, DOB 11-Feb-1951, MRN [id number]  PCP:  Rebekah Chesterfield, NP   [facility] Providers Cardiologist:  [name], MD     Referring MD: Rebekah Chesterfield, NP   No chief complaint on file. Pre-op  History of Present Illness:   Initial Visit Bradley Trujillo is a 72 y.o. male with a hx of arthritis, GERD, Guillain-barre, HTN, cecal adenocarcinoma, robotic assisted laparoscopic partial colectomy referral for pre-op, noted to be in afib. EKG 03/06/2023 shows rate controlled atrial fibrillation. Initially found to have signfiicant anemia hgb 6.4 on 02/07/2023. He received PRBC transfusion. He has received iron. Colonoscopy on 02/12/2023 + Cecal mass biopsy-adenocarcinoma. CT AP-Subtle hypodensity within the central segment 4 measuring 14 mm in the liver.   He was noted to be in afib when getting his transfusion. At pre-op afib was identified again.  He denies palpitations. He can tell if it is irregular. He notes this seems to have been long standing. Stool is dark. No BRBPR with no hx. Has leg and knee pain 2/2 Guillan Barre. Denies CP or SOB with his chores. He notes a prior stress test many years ago in the past for his job. That was normal. No cardiovascular dx hx otherwise.  Blood pressure is well controlled. No hx of OSA in the chart.   Social Hx: lives at home with his wife.Worked as a Chartered certified accountant for [employer]. Quite smoking > 30 years ago  Family Hx: no premature CAD. No cardiac dx  Interim hx 06/15/2023 He underwent robotic assisted laparoscopic right hemicolectomy on 04/27/2023. He tolerated the procedure well. He does not require cancer therapy with good margins. His hgb is now stable. He does note persistent palpitations. He denies DOE.  Interim hx 07/11/2023   Past Medical History:  Diagnosis Date   Arthritis    Dysrhythmia    GERD (gastroesophageal reflux disease)    Guillain-Barre syndrome (HCC)     History of hiatal hernia    Hypertension    Seasonal allergies     Past Surgical History:  Procedure Laterality Date   ANTERIOR CERVICAL DECOMP/DISCECTOMY FUSION N/A 04/23/2013   Procedure: ANTERIOR CERVICAL DECOMPRESSION/DISCECTOMY FUSION 3 LEVELS;  Surgeon: Karn Cassis, MD;  Location: [facility];  Service: Neurosurgery;  Laterality: N/A;  Cervical three-four, cervical four-five, cervical five-six Anterior cervical decompression/diskectomy/fusion   APPENDECTOMY     BIOPSY  02/12/2023   Procedure: BIOPSY;  Surgeon: Lanelle Bal, DO;  Location: [facility];  Service: Endoscopy;;   CERVICAL FUSION     C7   COLONOSCOPY WITH PROPOFOL N/A 02/12/2023   Procedure: COLONOSCOPY WITH PROPOFOL;  Surgeon: Lanelle Bal, DO;  Location: [facility];  Service: Endoscopy;  Laterality: N/A;  12:30pm, asa 3   ESOPHAGOGASTRODUODENOSCOPY (EGD) WITH PROPOFOL N/A 02/12/2023   Procedure: ESOPHAGOGASTRODUODENOSCOPY (EGD) WITH PROPOFOL;  Surgeon: Lanelle Bal, DO;  Location: [facility];  Service: Endoscopy;  Laterality: N/A;   POLYPECTOMY  02/12/2023   Procedure: POLYPECTOMY INTESTINAL;  Surgeon: Lanelle Bal, DO;  Location: [facility];  Service: Endoscopy;;    Current Medications: Current Outpatient Medications on File Prior to Visit  Medication Sig Dispense Refill   acetaminophen (TYLENOL) 500 MG tablet Take 1,000 mg by mouth every 6 (six) hours as needed for moderate pain or mild pain.     apixaban (ELIQUIS) 5 MG TABS tablet Take 1 tablet (5 mg total) by  mouth 2 (two) times daily. 60 tablet 6   celecoxib (CELEBREX) 200 MG capsule Take 200 mg by mouth every other day.     Cholecalciferol (VITAMIN D3) 250 MCG (10000 UT) capsule Take 10,000 Units by mouth every other day.     diltiazem (CARDIZEM) 30 MG tablet Take 1 tablet (30 mg total) by mouth every 6 (six) hours as needed. 40 tablet 2   diphenhydrAMINE (BENADRYL) 25 MG tablet Take 25 mg by mouth every 6 (six) hours as needed for  sleep.     Glycerin-Hypromellose-PEG 400 (VISINE DRY EYE) 0.2-0.2-1 % SOLN Place 1 drop into both eyes 3 (three) times daily as needed (dry/irritated eyes.).     loratadine (CLARITIN) 10 MG tablet Take 10 mg by mouth daily as needed for allergies.     Multiple Vitamin (MULTIVITAMIN WITH MINERALS) TABS Take 1 tablet by mouth daily. Centrum     Omega-3 Fatty Acids (FISH OIL) 1200 MG CAPS Take 1,200 mg by mouth daily.     omeprazole (PRILOSEC) 20 MG capsule Take 20 mg by mouth daily.     oxyCODONE-acetaminophen (PERCOCET/ROXICET) 5-325 MG tablet Take 1-2 tablets by mouth every 4 (four) hours as needed for severe pain. (Patient taking differently: Take 1 tablet by mouth every 8 (eight) hours as needed for severe pain.) 60 tablet 0   Oxymetazoline HCl (SINEX LONG-ACTING NA) Place 1 spray into the nose 2 (two) times daily as needed (congestion).     Probiotic CAPS Take 1 capsule by mouth daily.     zinc gluconate 50 MG tablet Take 50 mg by mouth daily.     No current facility-administered medications on file prior to visit.    Allergies:   Penicillins   Social History   Socioeconomic History   Marital status: Married    Spouse name: Not on file   Number of children: Not on file   Years of education: Not on file   Highest education level: Not on file  Occupational History   Not on file  Tobacco Use   Smoking status: Former   Smokeless tobacco: Never  Vaping Use   Vaping status: Former  Substance and Sexual Activity   Alcohol use: Yes    Alcohol/week: 1.0 standard drink of alcohol    Types: 1 Cans of beer per week    Comment: socially   Drug use: No   Sexual activity: Not on file  Other Topics Concern   Not on file  Social History Narrative   Not on file   Social Determinants of Health   Financial Resource Strain: Not on file  Food Insecurity: No Food Insecurity (04/27/2023)   Hunger Vital Sign    Worried About Running Out of Food in the Last Year: Never true    Ran Out of  Food in the Last Year: Never true  Transportation Needs: No Transportation Needs (04/27/2023)   PRAPARE - Administrator, Civil Service (Medical): No    Lack of Transportation (Non-Medical): No  Physical Activity: Not on file  Stress: Not on file  Social Connections: Not on file     Family History: Per above  ROS:   Please see the history of present illness.     All other systems reviewed and are negative.  EKGs/Labs/Other Studies Reviewed:    The following studies were reviewed today:  TTE 04/12/2023 EF 60-65%, nl RV LA size 66 ml/m2 No sig valve dx   EKG:  EKG is  ordered today.  The ekg ordered today demonstrates   EKG 03/13/2023- atrial fibrillation rate 83 bpm  EKG Interpretation Date/Time:  Wednesday July 11 2023 13:14:10 EDT Ventricular Rate:  89 PR Interval:    QRS Duration:  80 QT Interval:  362 QTC Calculation: 440 R Axis:   64  Text Interpretation: Atrial fibrillation When compared with ECG of 15-Jun-2023 13:57, No significant change was found Confirmed by Carolan Clines [phone]) on 07/11/2023 3:33:34 PM   Recent Labs: 03/13/2023: TSH 1.490 04/24/2023: ALT 17 04/29/2023: Hemoglobin 12.1; Platelets 223 04/30/2023: BUN 10; Creatinine, Ser 1.11; Magnesium 2.3; Potassium 4.0; Sodium 137   Recent Lipid Panel No results found for: "CHOL", "TRIG", "HDL", "CHOLHDL", "VLDL", "LDLCALC", "LDLDIRECT"   Risk Assessment/Calculations:    CHA2DS2-VASc Score = 2   This indicates a 2.2% annual risk of stroke. The patient's score is based upon: CHF History: 0 HTN History: 1 Diabetes History: 0 Stroke History: 0 Vascular Disease History: 0 Age Score: 1 Gender Score: 0           Physical Exam:    VS:   Vitals:   07/11/23 1316  BP: (!) 140/90  Pulse: 89  SpO2: 98%      Wt Readings from Last 3 Encounters:  06/15/23 168 lb 12.8 oz (76.6 kg)  05/24/23 168 lb 14 oz (76.6 kg)  05/08/23 166 lb (75.3 kg)     GEN:  Well nourished, well developed in  no acute distress HEENT: Normal NECK: No JVD CARDIAC: IRRR, no murmurs, rubs, gallops RESPIRATORY:  Clear to auscultation without rales, wheezing or rhonchi  ABDOMEN: non-distended MUSCULOSKELETAL:  No edema; No deformity  SKIN: Warm and dry NEUROLOGIC:  Alert and oriented x 3 PSYCHIATRIC:  Normal affect   ASSESSMENT:    New Onset Persistent Atrial Fibrillation: Diagnosed 03/06/2023. Chads2vasc =2. Cancer can increase risk of ischemic stroke and chads2vasc may underestimate this risk. Recommended starting anticoagulation after his surgery. He underwent robotic assisted laparoscopic partial colectomy successfully in May. His hgb was 6.3 prior to surgery. It is now 12.1 g/dL and stable. - in rate controlled atrial fibrillation today , mostly asymptomatic, but has low energy - not a good PVI candidate with his LA size and age.  PLAN:    In order of problems listed above:  Continue eliquis 5 mg BID Plan for DCCV [orders for surgery signed] Continue Diltiazem 30 mg Q6H PRN Follow up 6 months      Medication Adjustments/Labs and Tests Ordered: Current medicines are reviewed at length with the patient today.  Concerns regarding medicines are outlined above.  No orders of the defined types were placed in this encounter.  No orders of the defined types were placed in this encounter.   There are no Patient Instructions on file for this visit.   Signed, [name], MD  07/11/2023 11:53 AM     [facility]

## 2023-07-11 NOTE — Patient Instructions (Addendum)
Medication Instructions:  Your physician recommends that you continue on your current medications as directed. Please refer to the Current Medication list given to you today.  *If you need a refill on your cardiac medications before your next appointment, please call your pharmacy*   Lab Work: CBC  BMET  If you have labs (blood work) drawn today and your tests are completely normal, you will receive your results only by: MyChart Message (if you have MyChart) OR A paper copy in the mail If you have any lab test that is abnormal or we need to change your treatment, we will call you to review the results.   Testing/Procedures:    Dear Bradley Trujillo  You are scheduled for a Cardioversion on Monday, July 29 with Dr. Royann Shivers.  Please arrive at the [facility] (Main Entrance A) at [facility]: [address] [place], [place] [address] at 11:00 AM (This time is 1 hour(s) before your procedure to ensure your preparation). Free valet parking service is available. You will check in at ADMITTING. The support person will be asked to wait in the waiting room.  It is OK to have someone drop you off and come back when you are ready to be discharged.    DIET:  Nothing to eat or drink after midnight except a sip of water with medications (see medication instructions below)  MEDICATION INSTRUCTIONS: !!IF ANY NEW MEDICATIONS ARE STARTED AFTER TODAY, PLEASE NOTIFY YOUR PROVIDER AS SOON AS POSSIBLE!!  FYI: Medications such as Semaglutide (Ozempic, [place]), Tirzepatide (Mounjaro, Zepbound), Dulaglutide (Trulicity), etc ("GLP1 agonists") AND Canagliflozin (Invokana), Dapagliflozin (Farxiga), Empagliflozin (Jardiance), Ertugliflozin (Steglatro), Bexagliflozin [employer]) or any combination with one of these drugs such as Invokamet (Canagliflozin/Metformin), Synjardy (Empagliflozin/Metformin), etc ("SGLT2 inhibitors") must be held around the time of a procedure. This is not a comprehensive list of all of  these drugs. Please review all of your medications and talk to your provider if you take any one of these. If you are not sure, ask your provider.  Continue taking your anticoagulant (blood thinner): Apixaban (Eliquis).  You will need to continue this after your procedure until you are told by your provider that it is safe to stop.    LABS: labs drawn today FYI:  For your safety, and to allow [place] to monitor your vital signs accurately during the surgery/procedure we request: If you have artificial nails, gel coating, SNS etc, please have those removed prior to your surgery/procedure. Not having the nail coverings /polish removed may result in cancellation or delay of your surgery/procedure.  You must have a responsible person to drive you home and stay in the waiting area during your procedure. Failure to do so could result in cancellation.  Bring your insurance cards.  *Special Note: Every effort is made to have your procedure done on time. Occasionally there are emergencies that occur at the hospital that may cause delays. Please be patient if a delay does occur.      Follow-Up: At [facility], you and your health needs are our priority.  As part of our continuing mission to provide you with exceptional heart care, we have created designated Provider Care Teams.  These Care Teams include your primary Cardiologist (physician) and Advanced Practice Providers (APPs -  Physician Assistants and Nurse Practitioners) who all work together to provide you with the care you need, when you need it.  We recommend signing up for the patient portal called "MyChart".  Sign up information is provided on  this After Visit Summary.  MyChart is used to connect with patients for Virtual Visits (Telemedicine).  Patients are able to view lab/test results, encounter notes, upcoming appointments, etc.  Non-urgent messages can be sent to your provider as well.   To learn more about what you can do with  MyChart, go to [email or web address].    Your next appointment:   6 month(s)  Provider:   [name], MD

## 2023-07-12 LAB — CBC WITH DIFFERENTIAL
Basos: 2 %
EOS (ABSOLUTE): 0.1 10*3/uL (ref 0.0–0.4)
Eos: 1 %
Hematocrit: 47 % (ref 37.5–51.0)
Hemoglobin: 15 g/dL (ref 13.0–17.7)
Immature Grans (Abs): 0 10*3/uL (ref 0.0–0.1)
Immature Granulocytes: 0 %
Lymphs: 21 %
MCH: 26 pg — ABNORMAL LOW (ref 26.6–33.0)
MCV: 82 fL (ref 79–97)
Monocytes Absolute: 0.5 10*3/uL (ref 0.1–0.9)
Neutrophils Absolute: 5.4 10*3/uL (ref 1.4–7.0)
Neutrophils: 70 %
RBC: 5.77 x10E6/uL (ref 4.14–5.80)
RDW: 13.6 % (ref 11.6–15.4)
WBC: 7.7 10*3/uL (ref 3.4–10.8)

## 2023-07-12 LAB — BASIC METABOLIC PANEL
BUN: 13 mg/dL (ref 8–27)
Calcium: 10.2 mg/dL (ref 8.6–10.2)
Chloride: 106 mmol/L (ref 96–106)
Creatinine, Ser: 1.18 mg/dL (ref 0.76–1.27)
Glucose: 107 mg/dL — ABNORMAL HIGH (ref 70–99)
Potassium: 4.7 mmol/L (ref 3.5–5.2)
Sodium: 144 mmol/L (ref 134–144)
eGFR: 66 mL/min/{1.73_m2} (ref >59)

## 2023-07-13 NOTE — OR Nursing (Signed)
Called patient with pre-procedure instructions for tomorrow.   Patient informed of:   Time to arrive for procedure.1045 Remain NPO past midnight. Agreed. Must have a ride home and a responsible adult to remain with them for 24 hours post procedure. Confirmed. Confirmed blood thinner. Eliquis. Confirmed no breaks in taking blood thinner for 3+ weeks prior to procedure.Confirmed. Confirmed patient stopped all GLP-1s and GLP-2s for at least one week before procedure. N/A  Spoke with wife. She confirmed above information.

## 2023-07-16 ENCOUNTER — Encounter (HOSPITAL_COMMUNITY)
Admission: RE | Disposition: A | Discharge status: Home / Self Care | Payer: Self-pay | Source: Home / Self Care | Attending: Cardiovascular Disease

## 2023-07-16 ENCOUNTER — Other Ambulatory Visit: Payer: Self-pay

## 2023-07-16 ENCOUNTER — Ambulatory Visit (HOSPITAL_BASED_OUTPATIENT_CLINIC_OR_DEPARTMENT_OTHER): Payer: 59 | Admitting: Anesthesiology

## 2023-07-16 ENCOUNTER — Ambulatory Visit (HOSPITAL_COMMUNITY): Payer: 59 | Admitting: Anesthesiology

## 2023-07-16 ENCOUNTER — Ambulatory Visit (HOSPITAL_COMMUNITY)
Admission: RE | Admit: 2023-07-16 | Discharge: 2023-07-16 | Disposition: A | Discharge status: Home / Self Care | Payer: 59 | Attending: Cardiovascular Disease | Admitting: Cardiovascular Disease

## 2023-07-16 DIAGNOSIS — I4891 Unspecified atrial fibrillation: Secondary | ICD-10-CM | POA: Diagnosis not present

## 2023-07-16 DIAGNOSIS — Z87891 Personal history of nicotine dependence: Secondary | ICD-10-CM | POA: Insufficient documentation

## 2023-07-16 DIAGNOSIS — Z7901 Long term (current) use of anticoagulants: Secondary | ICD-10-CM | POA: Diagnosis not present

## 2023-07-16 DIAGNOSIS — D509 Iron deficiency anemia, unspecified: Secondary | ICD-10-CM | POA: Diagnosis not present

## 2023-07-16 DIAGNOSIS — I10 Essential (primary) hypertension: Secondary | ICD-10-CM | POA: Diagnosis not present

## 2023-07-16 DIAGNOSIS — I4819 Other persistent atrial fibrillation: Secondary | ICD-10-CM | POA: Insufficient documentation

## 2023-07-16 DIAGNOSIS — I480 Paroxysmal atrial fibrillation: Secondary | ICD-10-CM

## 2023-07-16 HISTORY — PX: CARDIOVERSION: SHX1299

## 2023-07-16 SURGERY — CARDIOVERSION
Anesthesia: General

## 2023-07-16 MED ORDER — PROPOFOL 10 MG/ML IV BOLUS
INTRAVENOUS | Status: DC | PRN
Start: 2023-07-16 — End: 2023-07-16
  Administered 2023-07-16: 10 mg via INTRAVENOUS
  Administered 2023-07-16: 40 mg via INTRAVENOUS
  Administered 2023-07-16: 10 mg via INTRAVENOUS

## 2023-07-16 MED ORDER — LIDOCAINE 2% (20 MG/ML) 5 ML SYRINGE
INTRAMUSCULAR | Status: DC | PRN
  Administered 2023-07-16: 40 mg via INTRAVENOUS

## 2023-07-16 MED ORDER — SODIUM CHLORIDE 0.9 % IV SOLN: INTRAVENOUS | Status: DC

## 2023-07-16 SURGICAL SUPPLY — 1 items: ELECT DEFIB PAD ADLT CADENCE (PAD) ×1 IMPLANT

## 2023-07-16 NOTE — Interval H&P Note (Signed)
History and Physical Interval Note:  07/16/2023 10:57 AM  Norman Clay  has presented today for surgery, with the diagnosis of afib.  The various methods of treatment have been discussed with the patient and family. After consideration of risks, benefits and other options for treatment, the patient has consented to  Procedure(s): CARDIOVERSION (N/A) as a surgical intervention.  The patient's history has been reviewed, patient examined, no change in status, stable for surgery.  I have reviewed the patient's chart and labs.  Questions were answered to the patient's satisfaction.     Iza Preston

## 2023-07-16 NOTE — Transfer of Care (Signed)
Immediate Anesthesia Transfer of Care Note  Patient: Bradley Trujillo  Procedure(s) Performed: CARDIOVERSION  Patient Location: Cath Lab  Anesthesia Type:General  Level of Consciousness: drowsy and patient cooperative  Airway & Oxygen Therapy: Patient Spontanous Breathing and Patient connected to nasal cannula oxygen  Post-op Assessment: Report given to RN and Post -op Vital signs reviewed and stable  Post vital signs: Reviewed and stable  Last Vitals:  Vitals Value Taken Time  BP    Temp    Pulse 92 07/16/23 1207  Resp 19 07/16/23 1207  SpO2 100 % 07/16/23 1207  Vitals shown include unfiled device data.  Last Pain:  Vitals:   07/16/23 1100  TempSrc: Temporal  PainSc: 0-No pain         Complications: No notable events documented.

## 2023-07-16 NOTE — Op Note (Addendum)
Procedure: Electrical Cardioversion Indications:  Atrial Fibrillation  Procedure Details:  Consent: Risks of procedure as well as the alternatives and risks of each were explained to the (patient/caregiver).  Consent for procedure obtained.  Time Out: Verified patient identification, verified procedure, site/side was marked, verified correct patient position, special equipment/implants available, medications/allergies/relevent history reviewed, required imaging and test results available.  Performed  Patient placed on cardiac monitor, pulse oximetry, supplemental oxygen as necessary.  Sedation given:  propofol 60 mg IV Dr. Maple Hudson Pacer pads placed anterior and posterior chest.  Cardioverted 1 time(s).  Cardioversion with synchronized biphasic 120J shock.  Evaluation: Findings: Post procedure EKG shows: NSR Complications: None Patient did tolerate procedure well.  Unfortunately, atrial fibrillation recurred after less than 5 minutes.  Time Spent Directly with the Patient:  30 minutes   Bradley Trujillo 07/16/2023, 12:06 PM

## 2023-07-16 NOTE — Anesthesia Preprocedure Evaluation (Signed)
Anesthesia Evaluation  Patient identified by MRN, date of birth, ID band Patient awake    Reviewed: Allergy & Precautions, H&P , NPO status , Patient's Chart, lab work & pertinent test results  Airway Mallampati: II   Neck ROM: full    Dental   Pulmonary former smoker   breath sounds clear to auscultation       Cardiovascular hypertension, + dysrhythmias Atrial Fibrillation  Rhythm:irregular Rate:Normal     Neuro/Psych  Neuromuscular disease    GI/Hepatic hiatal hernia,GERD  ,,  Endo/Other    Renal/GU      Musculoskeletal  (+) Arthritis ,    Abdominal   Peds  Hematology   Anesthesia Other Findings   Reproductive/Obstetrics                             Anesthesia Physical Anesthesia Plan  ASA: 3  Anesthesia Plan: General   Post-op Pain Management:    Induction: Intravenous  PONV Risk Score and Plan: 2 and Propofol infusion and Treatment may vary due to age or medical condition  Airway Management Planned: Nasal Cannula  Additional Equipment:   Intra-op Plan:   Post-operative Plan:   Informed Consent: I have reviewed the patients History and Physical, chart, labs and discussed the procedure including the risks, benefits and alternatives for the proposed anesthesia with the patient or authorized representative who has indicated his/her understanding and acceptance.     Dental advisory given  Plan Discussed with: CRNA, Anesthesiologist and Surgeon  Anesthesia Plan Comments:        Anesthesia Quick Evaluation

## 2023-07-17 ENCOUNTER — Encounter (HOSPITAL_COMMUNITY): Payer: Self-pay | Admitting: Cardiovascular Disease

## 2023-07-18 NOTE — Anesthesia Postprocedure Evaluation (Addendum)
Anesthesia Post Note  Patient: Bradley Trujillo  Procedure(s) Performed: CARDIOVERSION     Patient location during evaluation: Cath Lab Anesthesia Type: General Level of consciousness: awake and alert Pain management: pain level controlled Vital Signs Assessment: post-procedure vital signs reviewed and stable Respiratory status: spontaneous breathing, nonlabored ventilation and respiratory function stable Cardiovascular status: blood pressure returned to baseline and stable Postop Assessment: no apparent nausea or vomiting Anesthetic complications: no   No notable events documented.  Last Vitals:  Vitals:   07/16/23 1220 07/16/23 1225  BP: (!) 127/101 (!) 124/96  Pulse: 81 73  Resp: (!) 21 13  Temp: 36.7 C 36.7 C  SpO2: 94% 98%    Last Pain:  Vitals:   07/16/23 1225  TempSrc: Temporal  PainSc: 0-No pain                 Benzion Mesta

## 2023-08-14 ENCOUNTER — Encounter: Payer: Self-pay | Admitting: Hematology

## 2023-08-28 ENCOUNTER — Ambulatory Visit (HOSPITAL_COMMUNITY)
Admission: RE | Admit: 2023-08-28 | Discharge: 2023-08-28 | Disposition: A | Discharge status: Home / Self Care | Payer: 59 | Source: Ambulatory Visit | Attending: Hematology | Admitting: Hematology

## 2023-08-28 ENCOUNTER — Inpatient Hospital Stay: Payer: 59 | Attending: Hematology

## 2023-08-28 DIAGNOSIS — Z800 Family history of malignant neoplasm of digestive organs: Secondary | ICD-10-CM | POA: Insufficient documentation

## 2023-08-28 DIAGNOSIS — D509 Iron deficiency anemia, unspecified: Secondary | ICD-10-CM | POA: Insufficient documentation

## 2023-08-28 DIAGNOSIS — Z9049 Acquired absence of other specified parts of digestive tract: Secondary | ICD-10-CM | POA: Insufficient documentation

## 2023-08-28 DIAGNOSIS — C180 Malignant neoplasm of cecum: Secondary | ICD-10-CM | POA: Insufficient documentation

## 2023-08-28 DIAGNOSIS — Z806 Family history of leukemia: Secondary | ICD-10-CM | POA: Insufficient documentation

## 2023-08-28 DIAGNOSIS — Z87891 Personal history of nicotine dependence: Secondary | ICD-10-CM | POA: Insufficient documentation

## 2023-08-28 LAB — COMPREHENSIVE METABOLIC PANEL
ALT: 19 U/L (ref 0–44)
AST: 23 U/L (ref 15–41)
Albumin: 4 g/dL (ref 3.5–5.0)
Alkaline Phosphatase: 81 U/L (ref 38–126)
Anion gap: 7 (ref 5–15)
BUN: 13 mg/dL (ref 8–23)
CO2: 25 mmol/L (ref 22–32)
Calcium: 8.8 mg/dL — ABNORMAL LOW (ref 8.9–10.3)
Chloride: 106 mmol/L (ref 98–111)
Creatinine, Ser: 1.23 mg/dL (ref 0.61–1.24)
GFR, Estimated: >60 mL/min (ref >60)
Glucose, Bld: 107 mg/dL — ABNORMAL HIGH (ref 70–99)
Potassium: 3.6 mmol/L (ref 3.5–5.1)
Sodium: 138 mmol/L (ref 135–145)
Total Bilirubin: 0.8 mg/dL (ref 0.3–1.2)
Total Protein: 7.4 g/dL (ref 6.5–8.1)

## 2023-08-28 LAB — IRON AND TIBC
Iron: 51 ug/dL (ref 45–182)
Saturation Ratios: 12 % — ABNORMAL LOW (ref 17.9–39.5)
TIBC: 411 ug/dL (ref 250–450)
UIBC: 360 ug/dL

## 2023-08-28 LAB — CBC
HCT: 48.2 % (ref 39.0–52.0)
Hemoglobin: 15.2 g/dL (ref 13.0–17.0)
MCH: 26.1 pg (ref 26.0–34.0)
MCHC: 31.5 g/dL (ref 30.0–36.0)
MCV: 82.8 fL (ref 80.0–100.0)
Platelets: 259 10*3/uL (ref 150–400)
RBC: 5.82 MIL/uL — ABNORMAL HIGH (ref 4.22–5.81)
RDW: 16.1 % — ABNORMAL HIGH (ref 11.5–15.5)
WBC: 6.2 10*3/uL (ref 4.0–10.5)
nRBC: 0 % (ref 0.0–0.2)

## 2023-08-28 LAB — FERRITIN: Ferritin: 11 ng/mL — ABNORMAL LOW (ref 24–336)

## 2023-08-28 MED ORDER — IOHEXOL 9 MG/ML PO SOLN
ORAL | Status: AC
  Filled 2023-08-28: qty 500

## 2023-08-28 MED ORDER — IOHEXOL 300 MG/ML  SOLN
100.0000 mL | Freq: Once | INTRAMUSCULAR | Status: AC | PRN
  Administered 2023-08-28: 100 mL via INTRAVENOUS

## 2023-08-29 LAB — CEA: CEA: 2.1 ng/mL (ref 0.0–4.7)

## 2023-09-03 ENCOUNTER — Inpatient Hospital Stay (HOSPITAL_BASED_OUTPATIENT_CLINIC_OR_DEPARTMENT_OTHER): Payer: 59 | Admitting: Hematology

## 2023-09-03 VITALS — BP 153/91 | HR 54 | Temp 98.4°F | Resp 16 | Wt 171.9 lb

## 2023-09-03 DIAGNOSIS — Z9049 Acquired absence of other specified parts of digestive tract: Secondary | ICD-10-CM | POA: Diagnosis not present

## 2023-09-03 DIAGNOSIS — Z87891 Personal history of nicotine dependence: Secondary | ICD-10-CM | POA: Diagnosis not present

## 2023-09-03 DIAGNOSIS — C180 Malignant neoplasm of cecum: Secondary | ICD-10-CM

## 2023-09-03 DIAGNOSIS — D509 Iron deficiency anemia, unspecified: Secondary | ICD-10-CM

## 2023-09-03 DIAGNOSIS — Z800 Family history of malignant neoplasm of digestive organs: Secondary | ICD-10-CM | POA: Diagnosis not present

## 2023-09-03 DIAGNOSIS — Z806 Family history of leukemia: Secondary | ICD-10-CM | POA: Diagnosis not present

## 2023-09-03 NOTE — Patient Instructions (Addendum)
[facility]  Discharge Instructions  You were seen and examined today by Dr. Ellin Saba.  Dr. Ellin Saba discussed your most recent lab work and CT scan which revealed that everything looks good and stable except your iron is low.  Start taking over the counter Iron one tablet once daily.  Follow-up as scheduled in 3 months.    Thank you for choosing [facility] to provide your oncology and hematology care.   To afford each patient quality time with our provider, please arrive at least 15 minutes before your scheduled appointment time. You may need to reschedule your appointment if you arrive late (10 or more minutes). Arriving late affects you and other patients whose appointments are after yours.  Also, if you miss three or more appointments without notifying the office, you may be dismissed from the clinic at the provider's discretion.    Again, thank you for choosing [facility].  Our hope is that these requests will decrease the amount of time that you wait before being seen by our physicians.   If you have a lab appointment with the Cancer Center - please note that after April 8th, all labs will be drawn in the cancer center.  You do not have to check in or register with the main entrance as you have in the past but will complete your check-in at the cancer center.            _____________________________________________________________  Should you have questions after your visit to [facility], please contact our office at [phone] and follow the prompts.  Our office hours are 8:00 a.m. to 4:30 p.m. Monday - Thursday and 8:00 a.m. to 2:30 p.m. Friday.  Please note that voicemails left after 4:00 p.m. may not be returned until the following business day.  We are closed weekends and all major holidays.  You do have access to a nurse 24-7, just call the main number to the clinic [phone] and do not  press any options, hold on the line and a nurse will answer the phone.    For prescription refill requests, have your pharmacy contact our office and allow 72 hours.    Masks are no longer required in the cancer centers. If you would like for your care team to wear a mask while they are taking care of you, please let them know. You may have one support person who is at least 71 years old accompany you for your appointments.

## 2023-09-03 NOTE — Progress Notes (Signed)
[facility] [address] [place] [address]    Clinic Day:  09/03/2023  Referring physician: Rebekah Chesterfield, NP  Patient Care Team: Rebekah Chesterfield, NP as PCP - General (Internal Medicine) [name], MD as PCP - Cardiology (Cardiology) Jena Gauss [name], MD as Consulting Physician (Gastroenterology) Doreatha Massed, MD as Medical Oncologist (Medical Oncology) Therese Sarah, RN as Oncology Nurse Navigator (Medical Oncology)   ASSESSMENT & PLAN:   Assessment: 1.  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 - Received 2 units PRBC and 1 infusion of Ferrlecit to 50 mg.  Never had prior colonoscopy. - Colonoscopy (02/12/2023): Polypoid submucosal ulcerated nonobstructing large mass in the cecum, partially circumferential. - EGD with (02/12/2023): 6 cm hiatal hernia.  Normal duodenal bulb, first part and second part of duodenum. - Pathology: Cecal mass biopsy-adenocarcinoma - CT CAP (02/16/2023): Subtle hypodensity within the central segment 4 measuring 14 mm in the liver.  Prominent lymph node in the ileocolic mesentery measures 4 mm.  No other evidence of metastatic disease. - MRI liver (02/25/2023): No solid liver abnormality seen. - Right partial colectomy with terminal ileum resection on 04/27/2023 - Pathology: Moderately differentiated invasive carcinoma, margins negative, negative LVI/PNI, 0/21 lymph nodes involved, pT3 N0, MMR preserved   2.  Social/family history: - Lives at home with his wife.  He had Guillain-Barr syndrome at age 16.  He has bilateral knee pains and uses a cane to ambulate.  He is independent of ADLs and IADLs.  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  Quit smoking cigarettes more than 30 years ago. - Brother had colon cancer.  Father had abdominal cancer.  Nephew died of cancer.  Maternal grandfather had leukemia.    Plan: 1.  Stage II (T3 N0) cecal  adenocarcinoma: - He does not report any change in bowel habits. - Denies any bleeding per rectum or melena. - Reviewed labs from 08/28/2023: Normal LFTs.  CBC grossly normal.  CEA was 2.1.  Signatera test from June was negative. - CTAP on 08/28/2023 was negative for recurrence or metastasis. - Recommend follow-up in 3 months with repeat CEA and other labs.   2.  Iron deficiency anemia: - Ferritin is 11 and percent saturation is 12.  Hemoglobin 15.2. - Recommend starting iron tablet daily.    No orders of the defined types were placed in this encounter.     I,Katie Daubenspeck,acting as a Neurosurgeon for Doreatha Massed, MD.,have documented all relevant documentation on the behalf of Doreatha Massed, MD,as directed by  Doreatha Massed, MD while in the presence of Doreatha Massed, MD.   I, Doreatha Massed MD, have reviewed the above documentation for accuracy and completeness, and I agree with the above.   Doreatha Massed, MD   9/16/20243:17 PM  CHIEF COMPLAINT:   Diagnosis: Cecal adenocarcinoma    Cancer Staging  Cecal cancer [facility]) Staging form: Colon and Rectum, AJCC 8th Edition - Clinical stage from 02/21/2023: Stage IIA (cT3, cN0, cM0) - Unsigned    Prior Therapy: Right hemicolectomy  Current Therapy: Observation   HISTORY OF PRESENT ILLNESS:   Oncology History   No history exists.     INTERVAL HISTORY:   Bradley Trujillo is a 72 y.o. male seen for follow-up of cecal adenocarcinoma.  Denied any change in bowel habits.  Denied bleeding per rectum or melena.  Reports energy and appetite levels at 100%.  PAST MEDICAL HISTORY:  Past Medical History: Past Medical History:  Diagnosis Date   Arthritis    Dysrhythmia    GERD (gastroesophageal reflux disease)    Guillain-Barre syndrome (HCC)    History of hiatal hernia    Hypertension    Seasonal allergies     Surgical History: Past Surgical History:  Procedure Laterality Date   ANTERIOR CERVICAL  DECOMP/DISCECTOMY FUSION N/A 04/23/2013   Procedure: ANTERIOR CERVICAL DECOMPRESSION/DISCECTOMY FUSION 3 LEVELS;  Surgeon: Karn Cassis, MD;  Location: [facility] NEURO ORS;  Service: Neurosurgery;  Laterality: N/A;  Cervical three-four, cervical four-five, cervical five-six Anterior cervical decompression/diskectomy/fusion   APPENDECTOMY     BIOPSY  02/12/2023   Procedure: BIOPSY;  Surgeon: Lanelle Bal, DO;  Location: [facility] ENDO SUITE;  Service: Endoscopy;;   CARDIOVERSION N/A 07/16/2023   Procedure: CARDIOVERSION;  Surgeon: Thurmon Fair, MD;  Location: [facility] INVASIVE CV LAB;  Service: Cardiovascular;  Laterality: N/A;   CERVICAL FUSION     C7   COLONOSCOPY WITH PROPOFOL N/A 02/12/2023   Procedure: COLONOSCOPY WITH PROPOFOL;  Surgeon: Lanelle Bal, DO;  Location: [facility] ENDO SUITE;  Service: Endoscopy;  Laterality: N/A;  12:30pm, asa 3   ESOPHAGOGASTRODUODENOSCOPY (EGD) WITH PROPOFOL N/A 02/12/2023   Procedure: ESOPHAGOGASTRODUODENOSCOPY (EGD) WITH PROPOFOL;  Surgeon: Lanelle Bal, DO;  Location: [facility] END[facility];  Service: Endoscopy;  Laterality: N/A;   POLYPECTOMY  02/12/2023   Procedure: POLYPECTOMY INTESTINAL;  Surgeon: Lanelle Bal, DO;  Location: [facility] END[facility];  Service: Endoscopy;;    Social History: Social History   Socioeconomic History   Marital status: Married    Spouse name: Not on file   Number of children: Not on file   Years of education: Not on file   Highest education level: Not on file  Occupational History   Not on file  Tobacco Use   Smoking status: Former   Smokeless tobacco: Never  Vaping Use   Vaping status: Former  Substance and Sexual Activity   Alcohol use: Yes    Alcohol/week: 1.0 standard drink of alcohol    Types: 1 Cans of beer per week    Comment: socially   Drug use: No   Sexual activity: Not on file  Other Topics Concern   Not on file  Social History Narrative   Not on file   Social Determinants of Health   Financial Resource Strain:  Not on file  Food Insecurity: No Food Insecurity (04/27/2023)   Hunger Vital Sign    Worried About Running Out of Food in the Last Year: Never true    Ran Out of Food in the Last Year: Never true  Transportation Needs: No Transportation Needs (04/27/2023)   PRAPARE - Administrator, Civil Service (Medical): No    Lack of Transportation (Non-Medical): No  Physical Activity: Not on file  Stress: Not on file  Social Connections: Not on file  Intimate Partner Violence: Not At Risk (04/27/2023)   Humiliation, Afraid, Rape, and Kick questionnaire    Fear of Current or Ex-Partner: No    Emotionally Abused: No    Physically Abused: No    Sexually Abused: No    Family History: No family history on file.  Current Medications:  Current Outpatient Medications:    acetaminophen (TYLENOL) 500 MG tablet, Take 1,000 mg by mouth every 6 (six) hours as needed for moderate pain or mild pain., Disp: , Rfl:    apixaban (ELIQUIS) 5 MG TABS tablet, Take 1 tablet (5 mg total)  by mouth 2 (two) times daily., Disp: 60 tablet, Rfl: 6   diclofenac Sodium (VOLTAREN) 1 % GEL, Apply 1 Application topically 4 (four) times daily as needed (pain)., Disp: , Rfl:    diltiazem (CARDIZEM) 30 MG tablet, Take 1 tablet (30 mg total) by mouth every 6 (six) hours as needed., Disp: 40 tablet, Rfl: 2   diphenhydrAMINE (BENADRYL) 25 MG tablet, Take 25 mg by mouth at bedtime as needed for sleep., Disp: , Rfl:    Glycerin-Hypromellose-PEG 400 (VISINE DRY EYE) 0.2-0.2-1 % SOLN, Place 1 drop into both eyes 3 (three) times daily as needed (dry/irritated eyes.)., Disp: , Rfl:    loratadine (CLARITIN) 10 MG tablet, Take 10 mg by mouth daily as needed for allergies., Disp: , Rfl:    Multiple Vitamin (MULTIVITAMIN WITH MINERALS) TABS, Take 1 tablet by mouth daily. Centrum, Disp: , Rfl:    Omega-3 Fatty Acids (FISH OIL) 1200 MG CAPS, Take 1,200 mg by mouth daily., Disp: , Rfl:    omeprazole (PRILOSEC) 20 MG capsule, Take 20 mg by  mouth daily., Disp: , Rfl:    oxyCODONE-acetaminophen (PERCOCET/ROXICET) 5-325 MG tablet, Take 1-2 tablets by mouth every 4 (four) hours as needed for severe pain. (Patient taking differently: Take 1 tablet by mouth daily as needed for severe pain.), Disp: 60 tablet, Rfl: 0   Oxymetazoline HCl (SINEX LONG-ACTING NA), Place 1 spray into the nose 2 (two) times daily as needed (congestion)., Disp: , Rfl:    Probiotic CAPS, Take 1 capsule by mouth daily., Disp: , Rfl:    zinc gluconate 50 MG tablet, Take 50 mg by mouth daily., Disp: , Rfl:    Allergies: Allergies  Allergen Reactions   Penicillins Rash    Tolerated cefotetan on 04/27/23    REVIEW OF SYSTEMS:   Review of Systems  Constitutional:  Negative for chills, fatigue and fever.  HENT:   Negative for lump/mass, mouth sores, nosebleeds, sore throat and trouble swallowing.   Eyes:  Negative for eye problems.  Respiratory:  Negative for cough and shortness of breath.   Cardiovascular:  Negative for chest pain, leg swelling and palpitations.  Gastrointestinal:  Negative for abdominal pain, constipation, diarrhea, nausea and vomiting.  Genitourinary:  Negative for bladder incontinence, difficulty urinating, dysuria, frequency, hematuria and nocturia.   Musculoskeletal:  Negative for arthralgias, back pain, flank pain, myalgias and neck pain.  Skin:  Negative for itching and rash.  Neurological:  Negative for dizziness, headaches and numbness.  Hematological:  Does not bruise/bleed easily.  Psychiatric/Behavioral:  Negative for depression, sleep disturbance and suicidal ideas. The patient is not nervous/anxious.   All other systems reviewed and are negative.    VITALS:   Blood pressure (!) 153/91, pulse (!) 54, temperature 98.4 F (36.9 C), temperature source Oral, resp. rate 16, weight 171 lb 14.4 oz (78 kg), SpO2 99%.  Wt Readings from Last 3 Encounters:  09/03/23 171 lb 14.4 oz (78 kg)  07/16/23 169 lb (76.7 kg)  07/11/23 169 lb  (76.7 kg)    Body mass index is 29.51 kg/m.  Performance status (ECOG): 1 - Symptomatic but completely ambulatory  PHYSICAL EXAM:   Physical Exam Vitals and nursing note reviewed. Exam conducted with a chaperone present.  Constitutional:      Appearance: Normal appearance.  Cardiovascular:     Rate and Rhythm: Normal rate and regular rhythm.     Pulses: Normal pulses.     Heart sounds: Normal heart sounds.  Pulmonary:  Effort: Pulmonary effort is normal.     Breath sounds: Normal breath sounds.  Abdominal:     Palpations: Abdomen is soft. There is no hepatomegaly, splenomegaly or mass.     Tenderness: There is no abdominal tenderness.  Musculoskeletal:     Right lower leg: No edema.     Left lower leg: No edema.  Lymphadenopathy:     Cervical: No cervical adenopathy.     Right cervical: No superficial, deep or posterior cervical adenopathy.    Left cervical: No superficial, deep or posterior cervical adenopathy.     Upper Body:     Right upper body: No supraclavicular or axillary adenopathy.     Left upper body: No supraclavicular or axillary adenopathy.  Neurological:     General: No focal deficit present.     Mental Status: He is alert and oriented to person, place, and time.  Psychiatric:        Mood and Affect: Mood normal.        Behavior: Behavior normal.     LABS:      Latest Ref Rng & Units 08/28/2023   12:52 PM 07/11/2023    2:40 PM 04/29/2023    2:39 PM  CBC  WBC 4.0 - 10.5 K/uL 6.2  7.7  7.8   Hemoglobin 13.0 - 17.0 g/dL 44.0  10.2  72.5   Hematocrit 39.0 - 52.0 % 48.2  47.0  38.6   Platelets 150 - 400 K/uL 259   223       Latest Ref Rng & Units 08/28/2023   12:52 PM 07/11/2023    2:40 PM 04/30/2023    5:11 AM  CMP  Glucose 70 - 99 mg/dL 366  440  92   BUN 8 - 23 mg/dL 13  13  10    Creatinine 0.61 - 1.24 mg/dL 3.47  4.25  9.56   Sodium 135 - 145 mmol/L 138  144  137   Potassium 3.5 - 5.1 mmol/L 3.6  4.7  4.0   Chloride 98 - 111 mmol/L 106  106   106   CO2 22 - 32 mmol/L 25  23  24    Calcium 8.9 - 10.3 mg/dL 8.8  38.7  8.9   Total Protein 6.5 - 8.1 g/dL 7.4     Total Bilirubin 0.3 - 1.2 mg/dL 0.8     Alkaline Phos 38 - 126 U/L 81     AST 15 - 41 U/L 23     ALT 0 - 44 U/L 19        Lab Results  Component Value Date   CEA1 2.1 08/28/2023   /  CEA  Date Value Ref Range Status  08/28/2023 2.1 0.0 - 4.7 ng/mL Final    Comment:    (NOTE)                             Nonsmokers          <3.9                             Smokers             <5.6 [employer] Electrochemiluminescence Immunoassay (ECLIA) Values obtained with different assay methods or kits cannot be used interchangeably.  Results cannot be interpreted as absolute evidence of the presence or absence of malignant disease. Performed At: [facility] [employer] [address]  [address] [place], [place] [id number] Jolene Schimke MD [id number]    No results found for: "PSA1" No results found for: "CAN199" No results found for: "CAN125"  No results found for: "TOTALPROTELP", "ALBUMINELP", "A1GS", "A2GS", "BETS", "BETA2SER", "GAMS", "MSPIKE", "SPEI" Lab Results  Component Value Date   TIBC 411 08/28/2023   TIBC 454 (H) 02/08/2023   TIBC 423 02/07/2023   FERRITIN 11 (L) 08/28/2023   FERRITIN 5 (L) 02/08/2023   FERRITIN 5 (L) 02/07/2023   IRONPCTSAT 12 (L) 08/28/2023   IRONPCTSAT 2 (L) 02/08/2023   IRONPCTSAT 3 (L) 02/07/2023   No results found for: "LDH"   STUDIES:   CT Abdomen Pelvis W Contrast  Result Date: 09/03/2023 CLINICAL DATA:  Follow-up cecal carcinoma. Previous surgery. Surveillance. EXAM: CT ABDOMEN AND PELVIS WITH CONTRAST TECHNIQUE: Multidetector CT imaging of the abdomen and pelvis was performed using the standard protocol following bolus administration of intravenous contrast. RADIATION DOSE REDUCTION: This exam was performed according to the departmental dose-optimization program which includes automated exposure control, adjustment of the mA  and/or kV according to patient size and/or use of iterative reconstruction technique. CONTRAST:  OMNIPAQUE IOHEXOL 300 MG/ML  SOLN COMPARISON:  02/16/2023 FINDINGS: Lower Chest: No acute findings. Hepatobiliary: No suspicious hepatic masses identified. Gallbladder is unremarkable. No evidence of biliary ductal dilatation. Pancreas:  No mass or inflammatory changes. Spleen: Within normal limits in size and appearance. Adrenals/Urinary Tract: No suspicious masses identified. Benign Bosniak category 1 renal cysts are again seen bilaterally (No followup imaging is recommended). No evidence of ureteral calculi or hydronephrosis. Unremarkable unopacified urinary bladder. Stomach/Bowel: Stable large hiatal hernia. Postop changes from right colectomy. No No evidence of recurrent mass. No evidence of obstruction, inflammatory process or abnormal fluid collections. Diverticulosis is seen mainly involving the sigmoid colon, however there is no evidence of diverticulitis. Vascular/Lymphatic: No pathologically enlarged lymph nodes. No acute vascular findings. Reproductive:  No mass or other significant abnormality. Other:  None. Musculoskeletal: No suspicious bone lesions identified. Lumbar degenerative spondylosis noted, with posterior spinal fixation rod placement at L4-5 and L5-S1. IMPRESSION: No evidence of recurrent or metastatic carcinoma within the abdomen or pelvis. Stable large hiatal hernia. Colonic diverticulosis, without radiographic evidence of diverticulitis. Electronically Signed   By: Danae Orleans M.D.   On: 09/03/2023 12:57

## 2023-12-03 ENCOUNTER — Inpatient Hospital Stay: Payer: 59 | Attending: Hematology

## 2023-12-03 DIAGNOSIS — Z9049 Acquired absence of other specified parts of digestive tract: Secondary | ICD-10-CM | POA: Insufficient documentation

## 2023-12-03 DIAGNOSIS — C180 Malignant neoplasm of cecum: Secondary | ICD-10-CM | POA: Insufficient documentation

## 2023-12-03 DIAGNOSIS — G8929 Other chronic pain: Secondary | ICD-10-CM | POA: Diagnosis not present

## 2023-12-03 DIAGNOSIS — Z87891 Personal history of nicotine dependence: Secondary | ICD-10-CM | POA: Insufficient documentation

## 2023-12-03 DIAGNOSIS — D509 Iron deficiency anemia, unspecified: Secondary | ICD-10-CM | POA: Diagnosis not present

## 2023-12-03 DIAGNOSIS — Z800 Family history of malignant neoplasm of digestive organs: Secondary | ICD-10-CM | POA: Diagnosis not present

## 2023-12-03 LAB — CBC WITH DIFFERENTIAL/PLATELET
Abs Immature Granulocytes: 0.01 10*3/uL (ref 0.00–0.07)
Basophils Absolute: 0.1 10*3/uL (ref 0.0–0.1)
Basophils Relative: 2 %
Eosinophils Absolute: 0.1 10*3/uL (ref 0.0–0.5)
Eosinophils Relative: 2 %
HCT: 51.9 % (ref 39.0–52.0)
Hemoglobin: 16.7 g/dL (ref 13.0–17.0)
Immature Granulocytes: 0 %
Lymphocytes Relative: 19 %
Lymphs Abs: 1.2 10*3/uL (ref 0.7–4.0)
MCH: 28.4 pg (ref 26.0–34.0)
MCHC: 32.2 g/dL (ref 30.0–36.0)
MCV: 88.3 fL (ref 80.0–100.0)
Monocytes Absolute: 0.4 10*3/uL (ref 0.1–1.0)
Monocytes Relative: 6 %
Neutro Abs: 4.6 10*3/uL (ref 1.7–7.7)
Neutrophils Relative %: 71 %
Platelets: 226 10*3/uL (ref 150–400)
RBC: 5.88 MIL/uL — ABNORMAL HIGH (ref 4.22–5.81)
RDW: 15.9 % — ABNORMAL HIGH (ref 11.5–15.5)
WBC: 6.4 10*3/uL (ref 4.0–10.5)
nRBC: 0 % (ref 0.0–0.2)

## 2023-12-03 LAB — COMPREHENSIVE METABOLIC PANEL
ALT: 23 U/L (ref 0–44)
AST: 26 U/L (ref 15–41)
Albumin: 4 g/dL (ref 3.5–5.0)
Alkaline Phosphatase: 80 U/L (ref 38–126)
Anion gap: 8 (ref 5–15)
BUN: 16 mg/dL (ref 8–23)
CO2: 24 mmol/L (ref 22–32)
Calcium: 9.5 mg/dL (ref 8.9–10.3)
Chloride: 108 mmol/L (ref 98–111)
Creatinine, Ser: 1.15 mg/dL (ref 0.61–1.24)
GFR, Estimated: >60 mL/min (ref >60)
Glucose, Bld: 102 mg/dL — ABNORMAL HIGH (ref 70–99)
Potassium: 3.9 mmol/L (ref 3.5–5.1)
Sodium: 140 mmol/L (ref 135–145)
Total Bilirubin: 0.8 mg/dL (ref <1.2)
Total Protein: 7.2 g/dL (ref 6.5–8.1)

## 2023-12-03 LAB — IRON AND TIBC
Iron: 125 ug/dL (ref 45–182)
Saturation Ratios: 36 % (ref 17.9–39.5)
TIBC: 349 ug/dL (ref 250–450)
UIBC: 224 ug/dL

## 2023-12-03 LAB — FERRITIN: Ferritin: 41 ng/mL (ref 24–336)

## 2023-12-04 LAB — CEA: CEA: 2.2 ng/mL (ref 0.0–4.7)

## 2023-12-10 ENCOUNTER — Inpatient Hospital Stay (HOSPITAL_BASED_OUTPATIENT_CLINIC_OR_DEPARTMENT_OTHER): Payer: 59 | Admitting: Hematology

## 2023-12-10 VITALS — BP 132/82 | HR 87 | Temp 98.4°F | Resp 17

## 2023-12-10 DIAGNOSIS — D509 Iron deficiency anemia, unspecified: Secondary | ICD-10-CM | POA: Diagnosis not present

## 2023-12-10 DIAGNOSIS — C180 Malignant neoplasm of cecum: Secondary | ICD-10-CM

## 2023-12-10 NOTE — Patient Instructions (Addendum)
[facility] at [facility] Discharge Instructions   You were seen and examined today by Dr. Ellin Saba.  He reviewed the results of your lab work which are normal/stable. Your iron has improved to 41, up from 11. We would like it to be 50 or better. Continue iron tablets daily.   We will see you back in 3 months. We will repeat lab work and a CT scan prior to this visit.   Return as scheduled.    Thank you for choosing  Can[facility] Center at [facility] to provide your oncology and hematology care.  To afford each patient quality time with our provider, please arrive at least 15 minutes before your scheduled appointment time.   If you have a lab appointment with the Cancer Center please come in thru the Main Entrance and check in at the main information desk.  You need to re-schedule your appointment should you arrive 10 or more minutes late.  We strive to give you quality time with our providers, and arriving late affects you and other patients whose appointments are after yours.  Also, if you no show three or more times for appointments you may be dismissed from the clinic at the providers discretion.     Again, thank you for choosing [facility].  Our hope is that these requests will decrease the amount of time that you wait before being seen by our physicians.       _____________________________________________________________  Should you have questions after your visit to [facility], please contact our office at [phone] and follow the prompts.  Our office hours are 8:00 a.m. and 4:30 p.m. Monday - Friday.  Please note that voicemails left after 4:00 p.m. may not be returned until the following business day.  We are closed weekends and major holidays.  You do have access to a nurse 24-7, just call the main number to the clinic [phone] and do not press any options, hold on the line and a nurse will answer the phone.     For prescription refill requests, have your pharmacy contact our office and allow 72 hours.    Due to Covid, you will need to wear a mask upon entering the hospital. If you do not have a mask, a mask will be given to you at the Main Entrance upon arrival. For doctor visits, patients may have 1 support person age 30 or older with them. For treatment visits, patients can not have anyone with them due to social distancing guidelines and our immunocompromised population.

## 2023-12-10 NOTE — Progress Notes (Signed)
[facility] [address], [place] [address]    Clinic Day:  12/10/2023  Referring physician: Rebekah Chesterfield, NP  Patient Care Team: Rebekah Chesterfield, NP as PCP - General (Internal Medicine) [name], MD as PCP - Cardiology (Cardiology) Jena Gauss [name], MD as Consulting Physician (Gastroenterology) Doreatha Massed, MD as Medical Oncologist (Medical Oncology) Therese Sarah, RN as Oncology Nurse Navigator (Medical Oncology)   ASSESSMENT & PLAN:   Assessment: 1.  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 - Received 2 units PRBC and 1 infusion of Ferrlecit to 50 mg.  Never had prior colonoscopy. - Colonoscopy (02/12/2023): Polypoid submucosal ulcerated nonobstructing large mass in the cecum, partially circumferential. - EGD with (02/12/2023): 6 cm hiatal hernia.  Normal duodenal bulb, first part and second part of duodenum. - Pathology: Cecal mass biopsy-adenocarcinoma - CT CAP (02/16/2023): Subtle hypodensity within the central segment 4 measuring 14 mm in the liver.  Prominent lymph node in the ileocolic mesentery measures 4 mm.  No other evidence of metastatic disease. - MRI liver (02/25/2023): No solid liver abnormality seen. - Right partial colectomy with terminal ileum resection on 04/27/2023 - Pathology: Moderately differentiated invasive carcinoma, margins negative, negative LVI/PNI, 0/21 lymph nodes involved, pT3 N0, MMR preserved   2.  Social/family history: - Lives at home with his wife.  He had Guillain-Barr syndrome at age 72.  He has bilateral knee pains and uses a cane to ambulate.  He is independent of ADLs and IADLs.  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  Quit smoking cigarettes more than 30 years ago. - Brother had colon cancer.  Father had abdominal cancer.  Nephew died of cancer.  Maternal grandfather had leukemia.    Plan: 1.  Stage II (T3 N0) cecal  adenocarcinoma: - He does not report any change in bowel habits.  No bleeding per rectum or melena.  He has dark stools from taking iron. - Reviewed labs from 12/03/2023: Normal LFTs.  CEA was normal at 2.2.  CBC was grossly normal. - Recommend follow-up in 3 months with repeat CT scan of the abdomen and pelvis and CEA level.   2.  Iron deficiency anemia: - Continue iron tablet daily.  May decrease to 3 tablets weekly if he gets constipation.  He is tolerating well.  Ferritin improved to 41 from 11.  Hemoglobin also improved to 16.7.    No orders of the defined types were placed in this encounter.     Doreatha Massed, MD   12/23/20242:55 PM  CHIEF COMPLAINT:   Diagnosis: Cecal adenocarcinoma    Cancer Staging  Cecal cancer [facility]) Staging form: Colon and Rectum, AJCC 8th Edition - Clinical stage from 02/21/2023: Stage IIA (cT3, cN0, cM0) - Unsigned    Prior Therapy: Right hemicolectomy  Current Therapy: Observation   HISTORY OF PRESENT ILLNESS:   Oncology History   No history exists.     INTERVAL HISTORY:   Bradley Trujillo is a 72 y.o. male seen for follow-up of cecal adenocarcinoma.  Reports energy levels of 80% and appetite of 100%.  Chronic back pain and knee pains are stable.  PAST MEDICAL HISTORY:   Past Medical History: Past Medical History:  Diagnosis Date   Arthritis    Dysrhythmia    GERD (gastroesophageal reflux disease)    Guillain-Barre syndrome (HCC)    History of hiatal hernia    Hypertension    Seasonal  allergies     Surgical History: Past Surgical History:  Procedure Laterality Date   ANTERIOR CERVICAL DECOMP/DISCECTOMY FUSION N/A 04/23/2013   Procedure: ANTERIOR CERVICAL DECOMPRESSION/DISCECTOMY FUSION 3 LEVELS;  Surgeon: Karn Cassis, MD;  Location: [facility];  Service: Neurosurgery;  Laterality: N/A;  Cervical three-four, cervical four-five, cervical five-six Anterior cervical decompression/diskectomy/fusion   APPENDECTOMY     BIOPSY   02/12/2023   Procedure: BIOPSY;  Surgeon: Lanelle Bal, DO;  Location: [facility];  Service: Endoscopy;;   CARDIOVERSION N/A 07/16/2023   Procedure: CARDIOVERSION;  Surgeon: Thurmon Fair, MD;  Location: [facility] INVASIVE CV LAB;  Service: Cardiovascular;  Laterality: N/A;   CERVICAL FUSION     C7   COLONOSCOPY WITH PROPOFOL N/A 02/12/2023   Procedure: COLONOSCOPY WITH PROPOFOL;  Surgeon: Lanelle Bal, DO;  Location: [facility];  Service: Endoscopy;  Laterality: N/A;  12:30pm, asa 3   ESOPHAGOGASTRODUODENOSCOPY (EGD) WITH PROPOFOL N/A 02/12/2023   Procedure: ESOPHAGOGASTRODUODENOSCOPY (EGD) WITH PROPOFOL;  Surgeon: Lanelle Bal, DO;  Location: [facility];  Service: Endoscopy;  Laterality: N/A;   POLYPECTOMY  02/12/2023   Procedure: POLYPECTOMY INTESTINAL;  Surgeon: Lanelle Bal, DO;  Location: [facility];  Service: Endoscopy;;    Social History: Social History   Socioeconomic History   Marital status: Married    Spouse name: Not on file   Number of children: Not on file   Years of education: Not on file   Highest education level: Not on file  Occupational History   Not on file  Tobacco Use   Smoking status: Former   Smokeless tobacco: Never  Vaping Use   Vaping status: Former  Substance and Sexual Activity   Alcohol use: Yes    Alcohol/week: 1.0 standard drink of alcohol    Types: 1 Cans of beer per week    Comment: socially   Drug use: No   Sexual activity: Not on file  Other Topics Concern   Not on file  Social History Narrative   Not on file   Social Drivers of Health   Financial Resource Strain: Not on file  Food Insecurity: No Food Insecurity (04/27/2023)   Hunger Vital Sign    Worried About Running Out of Food in the Last Year: Never true    Ran Out of Food in the Last Year: Never true  Transportation Needs: No Transportation Needs (04/27/2023)   PRAPARE - Administrator, Civil Service (Medical): No    Lack of Transportation  (Non-Medical): No  Physical Activity: Not on file  Stress: Not on file  Social Connections: Not on file  Intimate Partner Violence: Not At Risk (04/27/2023)   Humiliation, Afraid, Rape, and Kick questionnaire    Fear of Current or Ex-Partner: No    Emotionally Abused: No    Physically Abused: No    Sexually Abused: No    Family History: No family history on file.  Current Medications:  Current Outpatient Medications:    acetaminophen (TYLENOL) 500 MG tablet, Take 1,000 mg by mouth every 6 (six) hours as needed for moderate pain or mild pain., Disp: , Rfl:    apixaban (ELIQUIS) 5 MG TABS tablet, Take 1 tablet (5 mg total) by mouth 2 (two) times daily., Disp: 60 tablet, Rfl: 6   diclofenac Sodium (VOLTAREN) 1 % GEL, Apply 1 Application topically 4 (four) times daily as needed (pain)., Disp: , Rfl:    diltiazem (CARDIZEM) 30 MG tablet, Take 1 tablet (  30 mg total) by mouth every 6 (six) hours as needed., Disp: 40 tablet, Rfl: 2   diphenhydrAMINE (BENADRYL) 25 MG tablet, Take 25 mg by mouth at bedtime as needed for sleep., Disp: , Rfl:    Glycerin-Hypromellose-PEG 400 (VISINE DRY EYE) 0.2-0.2-1 % SOLN, Place 1 drop into both eyes 3 (three) times daily as needed (dry/irritated eyes.)., Disp: , Rfl:    loratadine (CLARITIN) 10 MG tablet, Take 10 mg by mouth daily as needed for allergies., Disp: , Rfl:    Multiple Vitamin (MULTIVITAMIN WITH MINERALS) TABS, Take 1 tablet by mouth daily. Centrum, Disp: , Rfl:    Omega-3 Fatty Acids (FISH OIL) 1200 MG CAPS, Take 1,200 mg by mouth daily., Disp: , Rfl:    omeprazole (PRILOSEC) 20 MG capsule, Take 20 mg by mouth daily., Disp: , Rfl:    oxyCODONE-acetaminophen (PERCOCET/ROXICET) 5-325 MG tablet, Take 1-2 tablets by mouth every 4 (four) hours as needed for severe pain. (Patient taking differently: Take 1 tablet by mouth daily as needed for severe pain (pain score 7-10).), Disp: 60 tablet, Rfl: 0   Oxymetazoline HCl (SINEX LONG-ACTING NA), Place 1 spray  into the nose 2 (two) times daily as needed (congestion)., Disp: , Rfl:    Probiotic CAPS, Take 1 capsule by mouth daily., Disp: , Rfl:    zinc gluconate 50 MG tablet, Take 50 mg by mouth daily., Disp: , Rfl:    Allergies: Allergies  Allergen Reactions   Penicillins Rash    Tolerated cefotetan on 04/27/23    REVIEW OF SYSTEMS:   Review of Systems  Constitutional:  Negative for chills, fatigue and fever.  HENT:   Negative for lump/mass, mouth sores, nosebleeds, sore throat and trouble swallowing.   Eyes:  Negative for eye problems.  Respiratory:  Negative for cough and shortness of breath.   Cardiovascular:  Negative for chest pain, leg swelling and palpitations.  Gastrointestinal:  Negative for abdominal pain, constipation, diarrhea, nausea and vomiting.  Genitourinary:  Negative for bladder incontinence, difficulty urinating, dysuria, frequency, hematuria and nocturia.   Musculoskeletal:  Positive for arthralgias and back pain. Negative for flank pain, myalgias and neck pain.  Skin:  Negative for itching and rash.  Neurological:  Negative for dizziness, headaches and numbness.  Hematological:  Does not bruise/bleed easily.  Psychiatric/Behavioral:  Negative for depression, sleep disturbance and suicidal ideas. The patient is not nervous/anxious.   All other systems reviewed and are negative.    VITALS:   Blood pressure 132/82, pulse 87, temperature 98.4 F (36.9 C), temperature source Oral, resp. rate 17, SpO2 99%.  Wt Readings from Last 3 Encounters:  09/03/23 171 lb 14.4 oz (78 kg)  07/16/23 169 lb (76.7 kg)  07/11/23 169 lb (76.7 kg)    There is no height or weight on file to calculate BMI.  Performance status (ECOG): 1 - Symptomatic but completely ambulatory  PHYSICAL EXAM:   Physical Exam Vitals and nursing note reviewed. Exam conducted with a chaperone present.  Constitutional:      Appearance: Normal appearance.  Cardiovascular:     Rate and Rhythm: Normal rate  and regular rhythm.     Pulses: Normal pulses.     Heart sounds: Normal heart sounds.  Pulmonary:     Effort: Pulmonary effort is normal.     Breath sounds: Normal breath sounds.  Abdominal:     Palpations: Abdomen is soft. There is no hepatomegaly, splenomegaly or mass.     Tenderness: There is no abdominal tenderness.  Musculoskeletal:     Right lower leg: No edema.     Left lower leg: No edema.  Lymphadenopathy:     Cervical: No cervical adenopathy.     Right cervical: No superficial, deep or posterior cervical adenopathy.    Left cervical: No superficial, deep or posterior cervical adenopathy.     Upper Body:     Right upper body: No supraclavicular or axillary adenopathy.     Left upper body: No supraclavicular or axillary adenopathy.  Neurological:     General: No focal deficit present.     Mental Status: He is alert and oriented to person, place, and time.  Psychiatric:        Mood and Affect: Mood normal.        Behavior: Behavior normal.     LABS:      Latest Ref Rng & Units 12/03/2023    2:17 PM 08/28/2023   12:52 PM 07/11/2023    2:40 PM  CBC  WBC 4.0 - 10.5 K/uL 6.4  6.2  7.7   Hemoglobin 13.0 - 17.0 g/dL 16.1  09.6  04.5   Hematocrit 39.0 - 52.0 % 51.9  48.2  47.0   Platelets 150 - 400 K/uL 226  259        Latest Ref Rng & Units 12/03/2023    2:17 PM 08/28/2023   12:52 PM 07/11/2023    2:40 PM  CMP  Glucose 70 - 99 mg/dL 409  811  914   BUN 8 - 23 mg/dL 16  13  13    Creatinine 0.61 - 1.24 mg/dL 7.82  9.56  2.13   Sodium 135 - 145 mmol/L 140  138  144   Potassium 3.5 - 5.1 mmol/L 3.9  3.6  4.7   Chloride 98 - 111 mmol/L 108  106  106   CO2 22 - 32 mmol/L 24  25  23    Calcium 8.9 - 10.3 mg/dL 9.5  8.8  08.6   Total Protein 6.5 - 8.1 g/dL 7.2  7.4    Total Bilirubin <1.2 mg/dL 0.8  0.8    Alkaline Phos 38 - 126 U/L 80  81    AST 15 - 41 U/L 26  23    ALT 0 - 44 U/L 23  19       Lab Results  Component Value Date   CEA1 2.2 12/03/2023   /  CEA   Date Value Ref Range Status  12/03/2023 2.2 0.0 - 4.7 ng/mL Final    Comment:    (NOTE)                             Nonsmokers          <3.9                             Smokers             <5.6 Roche [employer] Electrochemiluminescence Immunoassay (ECLIA) Values obtained with different assay methods or kits cannot be used interchangeably.  Results cannot be interpreted as absolute evidence of the presence or absence of malignant disease. Performed At: [facility] [address] [place], [place] [id number] Jolene Schimke MD [id number]    No results found for: "PSA1" No results found for: "CAN199" No results found for: "CAN125"  No results found for: "TOTALPROTELP", "ALBUMINELP", "A1GS", "A2GS", "BETS", "BETA2SER", "GAMS", "MSPIKE", "  SPEI" Lab Results  Component Value Date   TIBC 349 12/03/2023   TIBC 411 08/28/2023   TIBC 454 (H) 02/08/2023   FERRITIN 41 12/03/2023   FERRITIN 11 (L) 08/28/2023   FERRITIN 5 (L) 02/08/2023   IRONPCTSAT 36 12/03/2023   IRONPCTSAT 12 (L) 08/28/2023   IRONPCTSAT 2 (L) 02/08/2023   No results found for: "LDH"   STUDIES:   No results found.

## 2023-12-29 ENCOUNTER — Other Ambulatory Visit: Payer: Self-pay | Admitting: Internal Medicine

## 2023-12-31 NOTE — Telephone Encounter (Signed)
 Prescription refill request for Eliquis received. Indication:afib Last office visit:7/24 Scr:1.15  12/24 Age: 73 Weight:78  kg  Prescription refilled

## 2024-03-05 ENCOUNTER — Inpatient Hospital Stay: Attending: Hematology

## 2024-03-05 DIAGNOSIS — R197 Diarrhea, unspecified: Secondary | ICD-10-CM | POA: Diagnosis not present

## 2024-03-05 DIAGNOSIS — Z9049 Acquired absence of other specified parts of digestive tract: Secondary | ICD-10-CM | POA: Insufficient documentation

## 2024-03-05 DIAGNOSIS — Z87891 Personal history of nicotine dependence: Secondary | ICD-10-CM | POA: Diagnosis not present

## 2024-03-05 DIAGNOSIS — C180 Malignant neoplasm of cecum: Secondary | ICD-10-CM | POA: Insufficient documentation

## 2024-03-05 DIAGNOSIS — D509 Iron deficiency anemia, unspecified: Secondary | ICD-10-CM | POA: Diagnosis not present

## 2024-03-05 DIAGNOSIS — Z806 Family history of leukemia: Secondary | ICD-10-CM | POA: Diagnosis not present

## 2024-03-05 DIAGNOSIS — Z800 Family history of malignant neoplasm of digestive organs: Secondary | ICD-10-CM | POA: Insufficient documentation

## 2024-03-05 LAB — COMPREHENSIVE METABOLIC PANEL
ALT: 24 U/L (ref 0–44)
AST: 24 U/L (ref 15–41)
Albumin: 4 g/dL (ref 3.5–5.0)
Alkaline Phosphatase: 77 U/L (ref 38–126)
Anion gap: 10 (ref 5–15)
BUN: 13 mg/dL (ref 8–23)
CO2: 25 mmol/L (ref 22–32)
Calcium: 9.7 mg/dL (ref 8.9–10.3)
Chloride: 102 mmol/L (ref 98–111)
Creatinine, Ser: 1.13 mg/dL (ref 0.61–1.24)
GFR, Estimated: >60 mL/min (ref >60)
Glucose, Bld: 100 mg/dL — ABNORMAL HIGH (ref 70–99)
Potassium: 4.1 mmol/L (ref 3.5–5.1)
Sodium: 137 mmol/L (ref 135–145)
Total Bilirubin: 0.7 mg/dL (ref 0.0–1.2)
Total Protein: 7.4 g/dL (ref 6.5–8.1)

## 2024-03-05 LAB — IRON AND TIBC
Iron: 102 ug/dL (ref 45–182)
Saturation Ratios: 30 % (ref 17.9–39.5)
TIBC: 340 ug/dL (ref 250–450)
UIBC: 238 ug/dL

## 2024-03-05 LAB — CBC WITH DIFFERENTIAL/PLATELET
Abs Immature Granulocytes: 0.02 10*3/uL (ref 0.00–0.07)
Basophils Absolute: 0.1 10*3/uL (ref 0.0–0.1)
Basophils Relative: 1 %
Eosinophils Absolute: 0.1 10*3/uL (ref 0.0–0.5)
Eosinophils Relative: 1 %
HCT: 51.6 % (ref 39.0–52.0)
Hemoglobin: 16.8 g/dL (ref 13.0–17.0)
Immature Granulocytes: 0 %
Lymphocytes Relative: 20 %
Lymphs Abs: 1.3 10*3/uL (ref 0.7–4.0)
MCH: 29.1 pg (ref 26.0–34.0)
MCHC: 32.6 g/dL (ref 30.0–36.0)
MCV: 89.3 fL (ref 80.0–100.0)
Monocytes Absolute: 0.4 10*3/uL (ref 0.1–1.0)
Monocytes Relative: 7 %
Neutro Abs: 4.3 10*3/uL (ref 1.7–7.7)
Neutrophils Relative %: 71 %
Platelets: 204 10*3/uL (ref 150–400)
RBC: 5.78 MIL/uL (ref 4.22–5.81)
RDW: 13.6 % (ref 11.5–15.5)
WBC: 6.1 10*3/uL (ref 4.0–10.5)
nRBC: 0 % (ref 0.0–0.2)

## 2024-03-05 LAB — FERRITIN: Ferritin: 54 ng/mL (ref 24–336)

## 2024-03-06 LAB — CEA: CEA: 2.5 ng/mL (ref 0.0–4.7)

## 2024-03-07 ENCOUNTER — Ambulatory Visit (HOSPITAL_COMMUNITY)
Admission: RE | Admit: 2024-03-07 | Discharge: 2024-03-07 | Disposition: A | Discharge status: Home / Self Care | Payer: Medicare HMO | Source: Ambulatory Visit | Attending: Hematology | Admitting: Hematology

## 2024-03-07 ENCOUNTER — Inpatient Hospital Stay: Payer: 59

## 2024-03-07 DIAGNOSIS — C180 Malignant neoplasm of cecum: Secondary | ICD-10-CM | POA: Insufficient documentation

## 2024-03-07 MED ORDER — IOHEXOL 300 MG/ML  SOLN
100.0000 mL | Freq: Once | INTRAMUSCULAR | Status: AC | PRN
  Administered 2024-03-07: 100 mL via INTRAVENOUS

## 2024-03-13 ENCOUNTER — Inpatient Hospital Stay (HOSPITAL_BASED_OUTPATIENT_CLINIC_OR_DEPARTMENT_OTHER): Payer: 59 | Admitting: Hematology

## 2024-03-13 VITALS — BP 134/90 | HR 88 | Temp 98.0°F | Resp 18 | Ht 64.0 in | Wt 170.0 lb

## 2024-03-13 DIAGNOSIS — C180 Malignant neoplasm of cecum: Secondary | ICD-10-CM | POA: Diagnosis not present

## 2024-03-13 DIAGNOSIS — D509 Iron deficiency anemia, unspecified: Secondary | ICD-10-CM

## 2024-03-13 NOTE — Patient Instructions (Addendum)
 [facility]  Discharge Instructions  You were seen and examined today by Dr. Ellin Saba.  Dr. Ellin Saba discussed your most recent lab work and CT scan which revealed everything looks good and stable. Your CT scan shows that you have fatty liver and enlarged prostate but did not show any cancer.   You can decrease the iron to twice a week.  Follow-up as scheduled in 3 months with labs.    Thank you for choosing [facility] to provide your oncology and hematology care.   To afford each patient quality time with our provider, please arrive at least 15 minutes before your scheduled appointment time. You may need to reschedule your appointment if you arrive late (10 or more minutes). Arriving late affects you and other patients whose appointments are after yours.  Also, if you miss three or more appointments without notifying the office, you may be dismissed from the clinic at the provider's discretion.    Again, thank you for choosing [facility].  Our hope is that these requests will decrease the amount of time that you wait before being seen by our physicians.   If you have a lab appointment with the Cancer Center - please note that after April 8th, all labs will be drawn in the cancer center.  You do not have to check in or register with the main entrance as you have in the past but will complete your check-in at the cancer center.            _____________________________________________________________  Should you have questions after your visit to [facility], please contact our office at [phone] and follow the prompts.  Our office hours are 8:00 a.m. to 4:30 p.m. Monday - Thursday and 8:00 a.m. to 2:30 p.m. Friday.  Please note that voicemails left after 4:00 p.m. may not be returned until the following business day.  We are closed weekends and all major holidays.  You do have access to a nurse 24-7,  just call the main number to the clinic [phone] and do not press any options, hold on the line and a nurse will answer the phone.    For prescription refill requests, have your pharmacy contact our office and allow 72 hours.    Masks are no longer required in the cancer centers. If you would like for your care team to wear a mask while they are taking care of you, please let them know. You may have one support person who is at least 72 years old accompany you for your appointments.

## 2024-03-13 NOTE — Progress Notes (Signed)
 [facility] [address], [place] [address]    Clinic Day:  03/13/2024  Referring physician: Rebekah Chesterfield, NP  Patient Care Team: Rebekah Chesterfield, NP as PCP - General (Internal Medicine) [name], MD as PCP - Cardiology (Cardiology) Jena Gauss [name], MD as Consulting Physician (Gastroenterology) Doreatha Massed, MD as Medical Oncologist (Medical Oncology) Therese Sarah, RN as Oncology Nurse Navigator (Medical Oncology)   ASSESSMENT & PLAN:   Assessment: 1.  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 - Received 2 units PRBC and 1 infusion of Ferrlecit to 50 mg.  Never had prior colonoscopy. - Colonoscopy (02/12/2023): Polypoid submucosal ulcerated nonobstructing large mass in the cecum, partially circumferential. - EGD with (02/12/2023): 6 cm hiatal hernia.  Normal duodenal bulb, first part and second part of duodenum. - Pathology: Cecal mass biopsy-adenocarcinoma - CT CAP (02/16/2023): Subtle hypodensity within the central segment 4 measuring 14 mm in the liver.  Prominent lymph node in the ileocolic mesentery measures 4 mm.  No other evidence of metastatic disease. - MRI liver (02/25/2023): No solid liver abnormality seen. - Right partial colectomy with terminal ileum resection on 04/27/2023 - Pathology: Moderately differentiated invasive carcinoma, margins negative, negative LVI/PNI, 0/21 lymph nodes involved, pT3 N0, MMR preserved   2.  Social/family history: - Lives at home with his wife.  He had Guillain-Barr syndrome at age 30.  He has bilateral knee pains and uses a cane to ambulate.  He is independent of ADLs and IADLs.  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  Quit smoking cigarettes more than 30 years ago. - Brother had colon cancer.  Father had abdominal cancer.  Nephew died of cancer.  Maternal grandfather had leukemia.    Plan: 1.  Stage II (T3 N0) cecal  adenocarcinoma: - He does not report any change in bowel habits.  No bleeding per rectum or melena. - Labs from 03/05/2024: Normal LFTs.  CBC normal.  CEA was normal at 2.5. - CTAP (03/07/2024): No evidence of metastatic disease.  Other benign findings including fatty liver, large hiatal hernia and enlarged prostate were discussed. - Recommend follow-up in 3 months with repeat labs and CEA level.  Will repeat imaging in 6 months.   2.  Iron deficiency anemia: - He is taking iron 3 times weekly.  However he has diarrhea but not sure if it is coming from iron.  Ferritin improved to 54 from 41.  Hemoglobin is stable at 16.8.  Recommend that he can decrease his iron to twice weekly.    Orders Placed This Encounter  Procedures   CEA    Standing Status:   Future    Expected Date:   06/13/2024    Expiration Date:   03/13/2025   CBC with Differential/Platelet    Standing Status:   Future    Expected Date:   06/13/2024    Expiration Date:   03/13/2025    Release to patient:   Immediate   Comprehensive metabolic panel with GFR    Standing Status:   Future    Expected Date:   06/13/2024    Expiration Date:   03/13/2025    Release to patient:   Immediate   Ferritin    Standing Status:   Future    Expected Date:   06/13/2024    Expiration Date:   03/13/2025    Release to patient:   Immediate  Iron and TIBC    Standing Status:   Future    Expected Date:   06/13/2024    Expiration Date:   03/13/2025    Release to patient:   Immediate     I,Helena R Teague,acting as a scribe for Doreatha Massed, MD.,have documented all relevant documentation on the behalf of Doreatha Massed, MD,as directed by  Doreatha Massed, MD while in the presence of Doreatha Massed, MD.  I, Doreatha Massed MD, have reviewed the above documentation for accuracy and completeness, and I agree with the above.     Doreatha Massed, MD   3/27/20253:34 PM  CHIEF COMPLAINT:   Diagnosis: Cecal  adenocarcinoma    Cancer Staging  Cecal cancer [facility]) Staging form: Colon and Rectum, AJCC 8th Edition - Clinical stage from 02/21/2023: Stage IIA (cT3, cN0, cM0) - Unsigned    Prior Therapy: Right hemicolectomy  Current Therapy: Observation   HISTORY OF PRESENT ILLNESS:   Oncology History   No history exists.     INTERVAL HISTORY:   Bradley Trujillo is a 73 y.o. male seen for follow-up of cecal adenocarcinoma.  He was last seen by me on 12/10/23.  Since his last visit, he underwent CT A/P on 03/07/24 that found: No evidence of metastatic disease. Large hiatal hernia. Hepatic steatosis. Enlarged prostate.  Today, he states that he is doing well overall. His appetite level is at 100%. His energy level is at 75%. He is accompanied by his wife. Godric reports iron supplements may occasionally cause diarrhea. He is taking his iron supplements 3 times a week. He denies any difficulty urinating, though he does urinate 3-4 times a night.   PAST MEDICAL HISTORY:   Past Medical History: Past Medical History:  Diagnosis Date   Arthritis    Dysrhythmia    GERD (gastroesophageal reflux disease)    Guillain-Barre syndrome (HCC)    History of hiatal hernia    Hypertension    Seasonal allergies     Surgical History: Past Surgical History:  Procedure Laterality Date   ANTERIOR CERVICAL DECOMP/DISCECTOMY FUSION N/A 04/23/2013   Procedure: ANTERIOR CERVICAL DECOMPRESSION/DISCECTOMY FUSION 3 LEVELS;  Surgeon: Karn Cassis, MD;  Location: [facility] NEURO ORS;  Service: Neurosurgery;  Laterality: N/A;  Cervical three-four, cervical four-five, cervical five-six Anterior cervical decompression/diskectomy/fusion   APPENDECTOMY     BIOPSY  02/12/2023   Procedure: BIOPSY;  Surgeon: Lanelle Bal, DO;  Location: [facility] ENDO SUITE;  Service: Endoscopy;;   CARDIOVERSION N/A 07/16/2023   Procedure: CARDIOVERSION;  Surgeon: Thurmon Fair, MD;  Location: [facility] INVASIVE CV LAB;  Service: Cardiovascular;  Laterality: N/A;    CERVICAL FUSION     C7   COLONOSCOPY WITH PROPOFOL N/A 02/12/2023   Procedure: COLONOSCOPY WITH PROPOFOL;  Surgeon: Lanelle Bal, DO;  Location: [facility] END[facility];  Service: Endoscopy;  Laterality: N/A;  12:30pm, asa 3   ESOPHAGOGASTRODUODENOSCOPY (EGD) WITH PROPOFOL N/A 02/12/2023   Procedure: ESOPHAGOGASTRODUODENOSCOPY (EGD) WITH PROPOFOL;  Surgeon: Lanelle Bal, DO;  Location: [facility];  Service: Endoscopy;  Laterality: N/A;   POLYPECTOMY  02/12/2023   Procedure: POLYPECTOMY INTESTINAL;  Surgeon: Lanelle Bal, DO;  Location: [facility];  Service: Endoscopy;;    Social History: Social History   Socioeconomic History   Marital status: Married    Spouse name: Not on file   Number of children: Not on file   Years of education: Not on file   Highest education level: Not on file  Occupational History   Not  on file  Tobacco Use   Smoking status: Former   Smokeless tobacco: Never  Vaping Use   Vaping status: Former  Substance and Sexual Activity   Alcohol use: Yes    Alcohol/week: 1.0 standard drink of alcohol    Types: 1 Cans of beer per week    Comment: socially   Drug use: No   Sexual activity: Not on file  Other Topics Concern   Not on file  Social History Narrative   Not on file   Social Drivers of Health   Financial Resource Strain: Not on file  Food Insecurity: No Food Insecurity (04/27/2023)   Hunger Vital Sign    Worried About Running Out of Food in the Last Year: Never true    Ran Out of Food in the Last Year: Never true  Transportation Needs: No Transportation Needs (04/27/2023)   PRAPARE - Administrator, Civil Service (Medical): No    Lack of Transportation (Non-Medical): No  Physical Activity: Not on file  Stress: Not on file  Social Connections: Not on file  Intimate Partner Violence: Not At Risk (04/27/2023)   Humiliation, Afraid, Rape, and Kick questionnaire    Fear of Current or Ex-Partner: No    Emotionally Abused: No     Physically Abused: No    Sexually Abused: No    Family History: No family history on file.  Current Medications:  Current Outpatient Medications:    acetaminophen (TYLENOL) 500 MG tablet, Take 1,000 mg by mouth every 6 (six) hours as needed for moderate pain or mild pain., Disp: , Rfl:    diclofenac Sodium (VOLTAREN) 1 % GEL, Apply 1 Application topically 4 (four) times daily as needed (pain)., Disp: , Rfl:    diltiazem (CARDIZEM) 30 MG tablet, Take 1 tablet (30 mg total) by mouth every 6 (six) hours as needed., Disp: 40 tablet, Rfl: 2   diphenhydrAMINE (BENADRYL) 25 MG tablet, Take 25 mg by mouth at bedtime as needed for sleep., Disp: , Rfl:    ELIQUIS 5 MG TABS tablet, TAKE 1 TABLET BY MOUTH TWICE A DAY, Disp: 60 tablet, Rfl: 6   Glycerin-Hypromellose-PEG 400 (VISINE DRY EYE) 0.2-0.2-1 % SOLN, Place 1 drop into both eyes 3 (three) times daily as needed (dry/irritated eyes.)., Disp: , Rfl:    loratadine (CLARITIN) 10 MG tablet, Take 10 mg by mouth daily as needed for allergies., Disp: , Rfl:    Multiple Vitamin (MULTIVITAMIN WITH MINERALS) TABS, Take 1 tablet by mouth daily. Centrum, Disp: , Rfl:    Omega-3 Fatty Acids (FISH OIL) 1200 MG CAPS, Take 1,200 mg by mouth daily., Disp: , Rfl:    omeprazole (PRILOSEC) 20 MG capsule, Take 20 mg by mouth daily., Disp: , Rfl:    oxyCODONE-acetaminophen (PERCOCET/ROXICET) 5-325 MG tablet, Take 1-2 tablets by mouth every 4 (four) hours as needed for severe pain. (Patient taking differently: Take 1 tablet by mouth daily as needed for severe pain (pain score 7-10).), Disp: 60 tablet, Rfl: 0   Oxymetazoline HCl (SINEX LONG-ACTING NA), Place 1 spray into the nose 2 (two) times daily as needed (congestion)., Disp: , Rfl:    Probiotic CAPS, Take 1 capsule by mouth daily., Disp: , Rfl:    zinc gluconate 50 MG tablet, Take 50 mg by mouth daily., Disp: , Rfl:    Ferrous Sulfate (IRON) 325 (65 Fe) MG TABS, 1 tablet Orally Once a day, Disp: , Rfl:     Allergies: Allergies  Allergen Reactions  Penicillins Rash    Tolerated cefotetan on 04/27/23    REVIEW OF SYSTEMS:   Review of Systems  Constitutional:  Negative for chills, fatigue and fever.       +generalized pain throughout the body, 7/10 severity  HENT:   Negative for lump/mass, mouth sores, nosebleeds, sore throat and trouble swallowing.   Eyes:  Negative for eye problems.  Respiratory:  Negative for cough and shortness of breath.   Cardiovascular:  Negative for chest pain, leg swelling and palpitations.  Gastrointestinal:  Positive for abdominal pain (7/10 severity) and constipation. Negative for diarrhea, nausea and vomiting.  Genitourinary:  Positive for nocturia. Negative for bladder incontinence, difficulty urinating, dysuria, frequency and hematuria.   Musculoskeletal:  Negative for arthralgias, back pain, flank pain, myalgias and neck pain.  Skin:  Negative for itching and rash.  Neurological:  Positive for numbness (and tingling in hands and feet). Negative for dizziness and headaches.  Hematological:  Does not bruise/bleed easily.  Psychiatric/Behavioral:  Positive for sleep disturbance. Negative for depression and suicidal ideas. The patient is not nervous/anxious.   All other systems reviewed and are negative.    VITALS:   Blood pressure (!) 134/90, pulse 88, temperature 98 F (36.7 C), temperature source Tympanic, resp. rate 18, height 5\' 4"  (1.626 m), weight 170 lb (77.1 kg), SpO2 98%.  Wt Readings from Last 3 Encounters:  03/13/24 170 lb (77.1 kg)  09/03/23 171 lb 14.4 oz (78 kg)  07/16/23 169 lb (76.7 kg)    Body mass index is 29.18 kg/m.  Performance status (ECOG): 1 - Symptomatic but completely ambulatory  PHYSICAL EXAM:   Physical Exam Vitals and nursing note reviewed. Exam conducted with a chaperone present.  Constitutional:      Appearance: Normal appearance.  Cardiovascular:     Rate and Rhythm: Normal rate and regular rhythm.      Pulses: Normal pulses.     Heart sounds: Normal heart sounds.  Pulmonary:     Effort: Pulmonary effort is normal.     Breath sounds: Normal breath sounds.  Abdominal:     Palpations: Abdomen is soft. There is no hepatomegaly, splenomegaly or mass.     Tenderness: There is no abdominal tenderness.  Musculoskeletal:     Right lower leg: No edema.     Left lower leg: No edema.  Lymphadenopathy:     Cervical: No cervical adenopathy.     Right cervical: No superficial, deep or posterior cervical adenopathy.    Left cervical: No superficial, deep or posterior cervical adenopathy.     Upper Body:     Right upper body: No supraclavicular or axillary adenopathy.     Left upper body: No supraclavicular or axillary adenopathy.  Neurological:     General: No focal deficit present.     Mental Status: He is alert and oriented to person, place, and time.  Psychiatric:        Mood and Affect: Mood normal.        Behavior: Behavior normal.     LABS:      Latest Ref Rng & Units 03/05/2024    2:54 PM 12/03/2023    2:17 PM 08/28/2023   12:52 PM  CBC  WBC 4.0 - 10.5 K/uL 6.1  6.4  6.2   Hemoglobin 13.0 - 17.0 g/dL 40.9  81.1  91.4   Hematocrit 39.0 - 52.0 % 51.6  51.9  48.2   Platelets 150 - 400 K/uL 204  226  259  Latest Ref Rng & Units 03/05/2024    2:54 PM 12/03/2023    2:17 PM 08/28/2023   12:52 PM  CMP  Glucose 70 - 99 mg/dL 161  096  045   BUN 8 - 23 mg/dL 13  16  13    Creatinine 0.61 - 1.24 mg/dL 4.09  8.11  9.14   Sodium 135 - 145 mmol/L 137  140  138   Potassium 3.5 - 5.1 mmol/L 4.1  3.9  3.6   Chloride 98 - 111 mmol/L 102  108  106   CO2 22 - 32 mmol/L 25  24  25    Calcium 8.9 - 10.3 mg/dL 9.7  9.5  8.8   Total Protein 6.5 - 8.1 g/dL 7.4  7.2  7.4   Total Bilirubin 0.0 - 1.2 mg/dL 0.7  0.8  0.8   Alkaline Phos 38 - 126 U/L 77  80  81   AST 15 - 41 U/L 24  26  23    ALT 0 - 44 U/L 24  23  19       Lab Results  Component Value Date   CEA1 2.5 03/05/2024   /  CEA   Date Value Ref Range Status  03/05/2024 2.5 0.0 - 4.7 ng/mL Final    Comment:    (NOTE)                             Nonsmokers          <3.9                             Smokers             <5.6 [employer] Electrochemiluminescence Immunoassay (ECLIA) Values obtained with different assay methods or kits cannot be used interchangeably.  Results cannot be interpreted as absolute evidence of the presence or absence of malignant disease. Performed At: [facility] [address] [place], [place] [id number] Jolene Schimke MD YQ:[id number]    No results found for: "PSA1" No results found for: "CAN199" No results found for: "CAN125"  No results found for: "TOTALPROTELP", "ALBUMINELP", "A1GS", "A2GS", "BETS", "BETA2SER", "GAMS", "MSPIKE", "SPEI" Lab Results  Component Value Date   TIBC 340 03/05/2024   TIBC 349 12/03/2023   TIBC 411 08/28/2023   FERRITIN 54 03/05/2024   FERRITIN 41 12/03/2023   FERRITIN 11 (L) 08/28/2023   IRONPCTSAT 30 03/05/2024   IRONPCTSAT 36 12/03/2023   IRONPCTSAT 12 (L) 08/28/2023   No results found for: "LDH"   STUDIES:   CT ABDOMEN PELVIS W CONTRAST Result Date: 03/12/2024 CLINICAL DATA:  Colon cancer.  * Tracking Code: BO * EXAM: CT ABDOMEN AND PELVIS WITH CONTRAST TECHNIQUE: Multidetector CT imaging of the abdomen and pelvis was performed using the standard protocol following bolus administration of intravenous contrast. RADIATION DOSE REDUCTION: This exam was performed according to the departmental dose-optimization program which includes automated exposure control, adjustment of the mA and/or kV according to patient size and/or use of iterative reconstruction technique. CONTRAST:  OMNIPAQUE IOHEXOL 300 MG/ML  SOLN COMPARISON:  08/28/2023. FINDINGS: Lower chest: Minimal scattered scarring in the lung bases. Heart is at the upper limits of normal in size. No pericardial or pleural effusion. Large hiatal hernia. Hepatobiliary: Liver is  decreased in attenuation diffusely. Liver and gallbladder are otherwise unremarkable. No biliary ductal dilatation. Pancreas: Negative. Spleen: Negative. Adrenals/Urinary Tract: Adrenal glands are  unremarkable. Low-attenuation lesions in kidneys. No specific follow-up necessary. Ureters are decompressed. Bladder is grossly unremarkable. Stomach/Bowel: Large hiatal hernia. Stomach and small bowel are unremarkable. Right hemicolectomy. Favor focal peristalsis in the distal descending colon (2/51), similar to the appearance more proximally (2/43 and 48). Colon is otherwise unremarkable. Vascular/Lymphatic: Vascular structures are unremarkable. No pathologically enlarged lymph nodes. Reproductive: Enlarged prostate.  Small right scrotal hydrocele. Other: No free fluid.  Mesenteries and peritoneum are unremarkable. Musculoskeletal: Degenerative changes in the spine. L4-S1 posterior lumbar interbody fusion. No worrisome lytic or sclerotic lesions. IMPRESSION: 1. No evidence of metastatic disease. 2. Large hiatal hernia. 3. Hepatic steatosis. 4. Enlarged prostate. Electronically Signed   By: Leanna Battles M.D.   On: 03/12/2024 15:07

## 2024-05-01 ENCOUNTER — Telehealth: Payer: Self-pay | Admitting: Hematology

## 2024-05-01 NOTE — Telephone Encounter (Signed)
 Faxed records to [facility] for [facility] 03/05/24.

## 2024-06-09 ENCOUNTER — Inpatient Hospital Stay

## 2024-06-16 ENCOUNTER — Inpatient Hospital Stay

## 2024-06-16 ENCOUNTER — Inpatient Hospital Stay: Admitting: Hematology

## 2024-06-17 ENCOUNTER — Inpatient Hospital Stay: Attending: Hematology

## 2024-06-17 DIAGNOSIS — D509 Iron deficiency anemia, unspecified: Secondary | ICD-10-CM | POA: Insufficient documentation

## 2024-06-17 DIAGNOSIS — Z800 Family history of malignant neoplasm of digestive organs: Secondary | ICD-10-CM | POA: Diagnosis not present

## 2024-06-17 DIAGNOSIS — Z87891 Personal history of nicotine dependence: Secondary | ICD-10-CM | POA: Insufficient documentation

## 2024-06-17 DIAGNOSIS — Z806 Family history of leukemia: Secondary | ICD-10-CM | POA: Diagnosis not present

## 2024-06-17 DIAGNOSIS — Z9049 Acquired absence of other specified parts of digestive tract: Secondary | ICD-10-CM | POA: Diagnosis not present

## 2024-06-17 DIAGNOSIS — C180 Malignant neoplasm of cecum: Secondary | ICD-10-CM | POA: Diagnosis present

## 2024-06-17 LAB — CBC WITH DIFFERENTIAL/PLATELET
Abs Immature Granulocytes: 0.01 10*3/uL (ref 0.00–0.07)
Basophils Absolute: 0.1 10*3/uL (ref 0.0–0.1)
Basophils Relative: 1 %
Eosinophils Absolute: 0.1 10*3/uL (ref 0.0–0.5)
Eosinophils Relative: 2 %
HCT: 51.3 % (ref 39.0–52.0)
Hemoglobin: 16.7 g/dL (ref 13.0–17.0)
Immature Granulocytes: 0 %
Lymphocytes Relative: 24 %
Lymphs Abs: 1.4 10*3/uL (ref 0.7–4.0)
MCH: 29.2 pg (ref 26.0–34.0)
MCHC: 32.6 g/dL (ref 30.0–36.0)
MCV: 89.7 fL (ref 80.0–100.0)
Monocytes Absolute: 0.5 10*3/uL (ref 0.1–1.0)
Monocytes Relative: 8 %
Neutro Abs: 3.7 10*3/uL (ref 1.7–7.7)
Neutrophils Relative %: 65 %
Platelets: 204 10*3/uL (ref 150–400)
RBC: 5.72 MIL/uL (ref 4.22–5.81)
RDW: 13.6 % (ref 11.5–15.5)
WBC: 5.7 10*3/uL (ref 4.0–10.5)
nRBC: 0 % (ref 0.0–0.2)

## 2024-06-17 LAB — COMPREHENSIVE METABOLIC PANEL WITH GFR
ALT: 18 U/L (ref 0–44)
AST: 25 U/L (ref 15–41)
Albumin: 3.9 g/dL (ref 3.5–5.0)
Alkaline Phosphatase: 67 U/L (ref 38–126)
Anion gap: 11 (ref 5–15)
BUN: 12 mg/dL (ref 8–23)
CO2: 23 mmol/L (ref 22–32)
Calcium: 9.5 mg/dL (ref 8.9–10.3)
Chloride: 107 mmol/L (ref 98–111)
Creatinine, Ser: 1.12 mg/dL (ref 0.61–1.24)
GFR, Estimated: >60 mL/min (ref >60)
Glucose, Bld: 89 mg/dL (ref 70–99)
Potassium: 4.1 mmol/L (ref 3.5–5.1)
Sodium: 141 mmol/L (ref 135–145)
Total Bilirubin: 1 mg/dL (ref 0.0–1.2)
Total Protein: 7.1 g/dL (ref 6.5–8.1)

## 2024-06-17 LAB — IRON AND TIBC
Iron: 109 ug/dL (ref 45–182)
Saturation Ratios: 32 % (ref 17.9–39.5)
TIBC: 344 ug/dL (ref 250–450)
UIBC: 235 ug/dL

## 2024-06-17 LAB — FERRITIN: Ferritin: 39 ng/mL (ref 24–336)

## 2024-06-18 LAB — CEA: CEA: 1.9 ng/mL (ref 0.0–4.7)

## 2024-06-23 ENCOUNTER — Inpatient Hospital Stay (HOSPITAL_BASED_OUTPATIENT_CLINIC_OR_DEPARTMENT_OTHER): Admitting: Hematology

## 2024-06-23 VITALS — BP 118/89 | HR 72 | Temp 98.3°F | Resp 18 | Ht 65.0 in | Wt 168.4 lb

## 2024-06-23 DIAGNOSIS — C180 Malignant neoplasm of cecum: Secondary | ICD-10-CM | POA: Diagnosis not present

## 2024-06-23 DIAGNOSIS — D509 Iron deficiency anemia, unspecified: Secondary | ICD-10-CM | POA: Diagnosis not present

## 2024-06-23 NOTE — Progress Notes (Signed)
 [facility] [address] [place] [address]    Clinic Day:  06/23/2024  Referring physician: Renato Dorothey HERO, NP  Patient Care Team: Renato Dorothey HERO, NP as PCP - General (Internal Medicine) Alvan Ronal BRAVO, MD (Inactive) as PCP - Cardiology (Cardiology) Shaaron Lamar HERO, MD as Consulting Physician (Gastroenterology) [name], MD as Medical Oncologist (Medical Oncology) Celestia Joesph SQUIBB, RN as Oncology Nurse Navigator (Medical Oncology)   ASSESSMENT & PLAN:   Assessment: 1.  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 - Received 2 units PRBC and 1 infusion of Ferrlecit to 50 mg.  Never had prior colonoscopy. - Colonoscopy (02/12/2023): Polypoid submucosal ulcerated nonobstructing large mass in the cecum, partially circumferential. - EGD with (02/12/2023): 6 cm hiatal hernia.  Normal duodenal bulb, first part and second part of duodenum. - Pathology: Cecal mass biopsy-adenocarcinoma - CT CAP (02/16/2023): Subtle hypodensity within the central segment 4 measuring 14 mm in the liver.  Prominent lymph node in the ileocolic mesentery measures 4 mm.  No other evidence of metastatic disease. - MRI liver (02/25/2023): No solid liver abnormality seen. - Right partial colectomy with terminal ileum resection on 04/27/2023 - Pathology: Moderately differentiated invasive carcinoma, margins negative, negative LVI/PNI, 0/21 lymph nodes involved, pT3 N0, MMR preserved   2.  Social/family history: - Lives at home with his wife.  He had Guillain-Barr syndrome at age 70.  He has bilateral knee pains and uses a cane to ambulate.  He is independent of ADLs and IADLs.  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  Quit smoking cigarettes more than 30 years ago. - Brother had colon cancer.  Father had abdominal cancer.  Nephew died of cancer.  Maternal grandfather had leukemia.    Plan: 1.  Stage II (T3 N0)  cecal adenocarcinoma: - He denies any change in bowel habits.  He has baseline diarrhea since surgery which is stable.  No bleeding per rectum or melena. - Reviewed labs from 06/17/2024: Normal LFTs.  CEA was normal. - Recommend that he have a colonoscopy 1 year after his initial diagnosis.  We will refer him to Dr. Cindie. - Recommend continual surveillance plan of clinic visit every 3 months with CEA and LFTs, imaging every 6 months for the first 2 years.  After that, he will come to the clinic every 6 months and imaging once a year. - RTC 3 months for follow-up with repeat CTAP with contrast and labs.   2.  Iron  deficiency anemia: - Continue iron  tablet twice weekly.  Ferritin is 39 and hemoglobin normal at 16.7.  Will continue monitoring intermittently.    Orders Placed This Encounter  Procedures   CT ABDOMEN PELVIS W CONTRAST    Standing Status:   Future    Expected Date:   09/23/2024    Expiration Date:   06/23/2025    If indicated for the ordered procedure, I authorize the administration of contrast media per Radiology protocol:   Yes    Does the patient have a contrast media/X-ray dye allergy?:   No    Preferred imaging location?:   [facility]    If indicated for the ordered procedure, I authorize the administration of oral contrast media per Radiology protocol:   Yes   CBC with Differential    Standing Status:   Future    Expected Date:   09/22/2024    Expiration Date:   12/21/2024  Comprehensive metabolic panel    Standing Status:   Future    Expected Date:   09/22/2024    Expiration Date:   12/21/2024   Iron  and TIBC ([facility] ONLY)    Standing Status:   Future    Expected Date:   09/22/2024    Expiration Date:   12/21/2024   Ferritin    Standing Status:   Future    Expected Date:   09/22/2024    Expiration Date:   12/21/2024   CEA    Standing Status:   Future    Expected Date:   09/22/2024    Expiration Date:   12/21/2024     [name],acting  as a scribe for Alean Stands, MD.,have documented all relevant documentation on the behalf of Alean Stands, MD,as directed by  Alean Stands, MD while in the presence of Alean Stands, MD.  I, Alean Stands MD, have reviewed the above documentation for accuracy and completeness, and I agree with the above.     Alean Stands, MD   7/7/20253:13 PM  CHIEF COMPLAINT:   Diagnosis: Cecal adenocarcinoma    Cancer Staging  Cecal cancer [facility]) Staging form: Colon and Rectum, AJCC 8th Edition - Clinical stage from 02/21/2023: Stage IIA (cT3, cN0, cM0) - Unsigned    Prior Therapy: Right hemicolectomy  Current Therapy: Observation   HISTORY OF PRESENT ILLNESS:   Oncology History   No history exists.     INTERVAL HISTORY:   Bradley Trujillo is a 73 y.o. male seen for follow-up of cecal adenocarcinoma.  He was last seen by me on 03/13/24.  Today, he states that he is doing well overall. His appetite level is at 100%. His energy level is at 80%.   PAST MEDICAL HISTORY:   Past Medical History: Past Medical History:  Diagnosis Date   Arthritis    Dysrhythmia    GERD (gastroesophageal reflux disease)    Guillain-Barre syndrome (HCC)    History of hiatal hernia    Hypertension    Seasonal allergies     Surgical History: Past Surgical History:  Procedure Laterality Date   ANTERIOR CERVICAL DECOMP/DISCECTOMY FUSION N/A 04/23/2013   Procedure: ANTERIOR CERVICAL DECOMPRESSION/DISCECTOMY FUSION 3 LEVELS;  Surgeon: Catalina CHRISTELLA Stains, MD;  Location: [facility];  Service: Neurosurgery;  Laterality: N/A;  Cervical three-four, cervical four-five, cervical five-six Anterior cervical decompression/diskectomy/fusion   APPENDECTOMY     BIOPSY  02/12/2023   Procedure: BIOPSY;  Surgeon: Cindie Carlin POUR, DO;  Location: [facility] END[facility] SU[facility];  Service: Endoscopy;;   CARDIOVERSION N/A 07/16/2023   Procedure: CARDIOVERSION;  Surgeon: Francyne Headland, MD;  Location: [facility] INVASIVE CV  LAB;  Service: Cardiovascular;  Laterality: N/A;   CERVICAL FUSION     C7   COLONOSCOPY WITH PROPOFOL  N/A 02/12/2023   Procedure: COLONOSCOPY WITH PROPOFOL ;  Surgeon: Cindie Carlin POUR, DO;  Location: [facility];  Service: Endoscopy;  Laterality: N/A;  12:30pm, asa 3   ESOPHAGOGASTRODUODENOSCOPY (EGD) WITH PROPOFOL  N/A 02/12/2023   Procedure: ESOPHAGOGASTRODUODENOSCOPY (EGD) WITH PROPOFOL ;  Surgeon: Cindie Carlin POUR, DO;  Location: [facility];  Service: Endoscopy;  Laterality: N/A;   POLYPECTOMY  02/12/2023   Procedure: POLYPECTOMY INTESTINAL;  Surgeon: Cindie Carlin POUR, DO;  Location: [facility];  Service: Endoscopy;;    Social History: Social History   Socioeconomic History   Marital status: Married    Spouse name: Not on file   Number of children: Not on file   Years of education: Not on  file   Highest education level: Not on file  Occupational History   Not on file  Tobacco Use   Smoking status: Former   Smokeless tobacco: Never  Vaping Use   Vaping status: Former  Substance and Sexual Activity   Alcohol use: Yes    Alcohol/week: 1.0 standard drink of alcohol    Types: 1 Cans of beer per week    Comment: socially   Drug use: No   Sexual activity: Not on file  Other Topics Concern   Not on file  Social History Narrative   Not on file   Social Drivers of Health   Financial Resource Strain: Not on file  Food Insecurity: No Food Insecurity (04/27/2023)   Hunger Vital Sign    Worried About Running Out of Food in the Last Year: Never true    Ran Out of Food in the Last Year: Never true  Transportation Needs: No Transportation Needs (04/27/2023)   PRAPARE - Administrator, Civil Service (Medical): No    Lack of Transportation (Non-Medical): No  Physical Activity: Not on file  Stress: Not on file  Social Connections: Not on file  Intimate Partner Violence: Not At Risk (04/27/2023)   Humiliation, Afraid, Rape, and Kick questionnaire    Fear of Current  or Ex-Partner: No    Emotionally Abused: No    Physically Abused: No    Sexually Abused: No    Family History: No family history on file.  Current Medications:  Current Outpatient Medications:    acetaminophen  (TYLENOL ) 500 MG tablet, Take 1,000 mg by mouth every 6 (six) hours as needed for moderate pain or mild pain., Disp: , Rfl:    diclofenac Sodium (VOLTAREN) 1 % GEL, Apply 1 Application topically 4 (four) times daily as needed (pain)., Disp: , Rfl:    diltiazem  (CARDIZEM ) 30 MG tablet, Take 1 tablet (30 mg total) by mouth every 6 (six) hours as needed., Disp: 40 tablet, Rfl: 2   diphenhydrAMINE  (BENADRYL ) 25 MG tablet, Take 25 mg by mouth at bedtime as needed for sleep., Disp: , Rfl:    ELIQUIS  5 MG TABS tablet, TAKE 1 TABLET BY MOUTH TWICE A DAY, Disp: 60 tablet, Rfl: 6   Ferrous Sulfate  (IRON ) 325 (65 Fe) MG TABS, 1 tablet Orally Once a day, Disp: , Rfl:    Glycerin -Hypromellose-PEG 400 (VISINE DRY EYE) 0.2-0.2-1 % SOLN, Place 1 drop into both eyes 3 (three) times daily as needed (dry/irritated eyes.)., Disp: , Rfl:    loratadine  (CLARITIN ) 10 MG tablet, Take 10 mg by mouth daily as needed for allergies., Disp: , Rfl:    Multiple Vitamin (MULTIVITAMIN WITH MINERALS) TABS, Take 1 tablet by mouth daily. Centrum, Disp: , Rfl:    Omega-3 Fatty Acids (FISH OIL) 1200 MG CAPS, Take 1,200 mg by mouth daily., Disp: , Rfl:    omeprazole (PRILOSEC) 20 MG capsule, Take 20 mg by mouth daily., Disp: , Rfl:    oxyCODONE -acetaminophen  (PERCOCET/ROXICET) 5-325 MG tablet, Take 1-2 tablets by mouth every 4 (four) hours as needed for severe pain. (Patient taking differently: Take 1 tablet by mouth daily as needed for severe pain (pain score 7-10).), Disp: 60 tablet, Rfl: 0   Oxymetazoline HCl (SINEX LONG-ACTING NA), Place 1 spray into the nose 2 (two) times daily as needed (congestion)., Disp: , Rfl:    Probiotic CAPS, Take 1 capsule by mouth daily., Disp: , Rfl:    zinc  gluconate 50 MG tablet, Take 50  mg by  mouth daily., Disp: , Rfl:    Allergies: Allergies  Allergen Reactions   Penicillins Rash    Tolerated cefotetan  on 04/27/23    REVIEW OF SYSTEMS:   Review of Systems  Constitutional:  Negative for chills, fatigue and fever.  HENT:   Negative for lump/mass, mouth sores, nosebleeds, sore throat and trouble swallowing.   Eyes:  Negative for eye problems.  Respiratory:  Negative for cough and shortness of breath.   Cardiovascular:  Negative for chest pain, leg swelling and palpitations.  Gastrointestinal:  Positive for constipation and diarrhea. Negative for abdominal pain, nausea and vomiting.  Genitourinary:  Negative for bladder incontinence, difficulty urinating, dysuria, frequency, hematuria and nocturia.   Musculoskeletal:  Negative for arthralgias, back pain, flank pain, myalgias and neck pain.  Skin:  Negative for itching and rash.  Neurological:  Positive for numbness. Negative for dizziness and headaches.  Hematological:  Does not bruise/bleed easily.  Psychiatric/Behavioral:  Negative for depression, sleep disturbance and suicidal ideas. The patient is not nervous/anxious.   All other systems reviewed and are negative.    VITALS:   Blood pressure 118/89, pulse 72, temperature 98.3 F (36.8 C), temperature source Tympanic, resp. rate 18, height 5' 5 (1.651 m), weight 168 lb 6.4 oz (76.4 kg), SpO2 97%.  Wt Readings from Last 3 Encounters:  06/23/24 168 lb 6.4 oz (76.4 kg)  03/13/24 170 lb (77.1 kg)  09/03/23 171 lb 14.4 oz (78 kg)    Body mass index is 28.02 kg/m.  Performance status (ECOG): 1 - Symptomatic but completely ambulatory  PHYSICAL EXAM:   Physical Exam Vitals and nursing note reviewed. Exam conducted with a chaperone present.  Constitutional:      Appearance: Normal appearance.  Cardiovascular:     Rate and Rhythm: Normal rate and regular rhythm.     Pulses: Normal pulses.     Heart sounds: Normal heart sounds.  Pulmonary:     Effort:  Pulmonary effort is normal.     Breath sounds: Normal breath sounds.  Abdominal:     Palpations: Abdomen is soft. There is no hepatomegaly, splenomegaly or mass.     Tenderness: There is no abdominal tenderness.  Musculoskeletal:     Right lower leg: No edema.     Left lower leg: No edema.  Lymphadenopathy:     Cervical: No cervical adenopathy.     Right cervical: No superficial, deep or posterior cervical adenopathy.    Left cervical: No superficial, deep or posterior cervical adenopathy.     Upper Body:     Right upper body: No supraclavicular or axillary adenopathy.     Left upper body: No supraclavicular or axillary adenopathy.  Neurological:     General: No focal deficit present.     Mental Status: He is alert and oriented to person, place, and time.  Psychiatric:        Mood and Affect: Mood normal.        Behavior: Behavior normal.     LABS:      Latest Ref Rng & Units 06/17/2024    2:00 PM 03/05/2024    2:54 PM 12/03/2023    2:17 PM  CBC  WBC 4.0 - 10.5 K/uL 5.7  6.1  6.4   Hemoglobin 13.0 - 17.0 g/dL 83.2  83.1  83.2   Hematocrit 39.0 - 52.0 % 51.3  51.6  51.9   Platelets 150 - 400 K/uL 204  204  226       Latest Ref  Rng & Units 06/17/2024    2:00 PM 03/05/2024    2:54 PM 12/03/2023    2:17 PM  CMP  Glucose 70 - 99 mg/dL 89  899  897   BUN 8 - 23 mg/dL 12  13  16    Creatinine 0.61 - 1.24 mg/dL 8.87  8.86  8.84   Sodium 135 - 145 mmol/L 141  137  140   Potassium 3.5 - 5.1 mmol/L 4.1  4.1  3.9   Chloride 98 - 111 mmol/L 107  102  108   CO2 22 - 32 mmol/L 23  25  24    Calcium 8.9 - 10.3 mg/dL 9.5  9.7  9.5   Total Protein 6.5 - 8.1 g/dL 7.1  7.4  7.2   Total Bilirubin 0.0 - 1.2 mg/dL 1.0  0.7  0.8   Alkaline Phos 38 - 126 U/L 67  77  80   AST 15 - 41 U/L 25  24  26    ALT 0 - 44 U/L 18  24  23       Lab Results  Component Value Date   CEA1 1.9 06/17/2024   /  CEA  Date Value Ref Range Status  06/17/2024 1.9 0.0 - 4.7 ng/mL Final    Comment:    (NOTE)                              Nonsmokers          <3.9                             Smokers             <5.6 Roche [employer] Electrochemiluminescence Immunoassay (ECLIA) Values obtained with different assay methods or kits cannot be used interchangeably.  Results cannot be interpreted as absolute evidence of the presence or absence of malignant disease. Performed At: [facility] [address] [place], [place] [id number] Jennette Shorter MD Ey:[phone]    No results found for: PSA1 No results found for: CAN199 No results found for: CAN125  No results found for: STEPHANY CARLOTA BENSON MARKEL EARLA JOANNIE DOC, MSPIKE, SPEI Lab Results  Component Value Date   TIBC 344 06/17/2024   TIBC 340 03/05/2024   TIBC 349 12/03/2023   FERRITIN 39 06/17/2024   FERRITIN 54 03/05/2024   FERRITIN 41 12/03/2023   IRONPCTSAT 32 06/17/2024   IRONPCTSAT 30 03/05/2024   IRONPCTSAT 36 12/03/2023   No results found for: LDH   STUDIES:   No results found.

## 2024-06-23 NOTE — Patient Instructions (Signed)
 [facility] at [facility] Discharge Instructions   You were seen and examined today by Dr. Rogers.  He reviewed the results of your lab work which are normal/stable.   We will see you back in 3 months. We will repeat lab work and a CT scan prior to this    Return as scheduled.    Thank you for choosing [facility] at [facility] to provide your oncology and hematology care.  To afford each patient quality time with our provider, please arrive at least 15 minutes before your scheduled appointment time.   If you have a lab appointment with the Cancer Center please come in thru the Main Entrance and check in at the main information desk.  You need to re-schedule your appointment should you arrive 10 or more minutes late.  We strive to give you quality time with our providers, and arriving late affects you and other patients whose appointments are after yours.  Also, if you no show three or more times for appointments you may be dismissed from the clinic at the providers discretion.     Again, thank you for choosing [facility].  Our hope is that these requests will decrease the amount of time that you wait before being seen by our physicians.       _____________________________________________________________  Should you have questions after your visit to [facility], please contact our office at [phone] and follow the prompts.  Our office hours are 8:00 a.m. and 4:30 p.m. Monday - Friday.  Please note that voicemails left after 4:00 p.m. may not be returned until the following business day.  We are closed weekends and major holidays.  You do have access to a nurse 24-7, just call the main number to the clinic [phone] and do not press any options, hold on the line and a nurse will answer the phone.    For prescription refill requests, have your pharmacy contact our office and allow 72 hours.    Due to Covid,  you will need to wear a mask upon entering the hospital. If you do not have a mask, a mask will be given to you at the Main Entrance upon arrival. For doctor visits, patients may have 1 support person age 61 or older with them. For treatment visits, patients can not have anyone with them due to social distancing guidelines and our immunocompromised population.

## 2024-06-24 ENCOUNTER — Encounter: Payer: Self-pay | Admitting: Internal Medicine

## 2024-07-10 ENCOUNTER — Ambulatory Visit (INDEPENDENT_AMBULATORY_CARE_PROVIDER_SITE_OTHER): Admitting: Internal Medicine

## 2024-07-10 ENCOUNTER — Encounter: Payer: Self-pay | Admitting: *Deleted

## 2024-07-10 VITALS — BP 120/85 | HR 96 | Temp 98.5°F | Ht 66.0 in | Wt 169.4 lb

## 2024-07-10 DIAGNOSIS — Z7901 Long term (current) use of anticoagulants: Secondary | ICD-10-CM

## 2024-07-10 DIAGNOSIS — Z87891 Personal history of nicotine dependence: Secondary | ICD-10-CM | POA: Diagnosis not present

## 2024-07-10 DIAGNOSIS — K219 Gastro-esophageal reflux disease without esophagitis: Secondary | ICD-10-CM

## 2024-07-10 DIAGNOSIS — Z85038 Personal history of other malignant neoplasm of large intestine: Secondary | ICD-10-CM | POA: Diagnosis not present

## 2024-07-10 DIAGNOSIS — C180 Malignant neoplasm of cecum: Secondary | ICD-10-CM

## 2024-07-10 NOTE — Progress Notes (Addendum)
 Referring Provider: Renato Dorothey HERO, NP Primary Care Physician:  Renato Dorothey HERO, NP Primary GI:  Dr. Cindie  Chief Complaint  Patient presents with   New Patient (Initial Visit)    Pt here for colonoscopy appt due to Ca 2024    HPI:   Bradley Trujillo is a 73 y.o. male who presents to the clinic today to discuss surveillance colonoscopy.  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  Biopsies confirmed adenocarcinoma.  Underwent right hemicolectomy 04/27/2023.  Pathology showed moderately differentiated invasive carcinoma, margins negative, negative LVI/PNI, 0/21 lymph nodes involved, pT3 N0, MMR preserved   CEA has remained normal.  Due for surveillance colonoscopy today.  Today, suffers from mild post hemicolectomy diarrhea.  Otherwise no GI complaints.  Denies any melena hematochezia.  No unintentional weight loss.  Chronic GERD well-controlled omeprazole daily.  No dysphagia odynophagia.  No epigastric or chest pain.  EGD (02/12/2023): 6 cm hiatal hernia. Normal duodenal bulb, first part and second part of duodenum.   Past Medical History:  Diagnosis Date   Arthritis    Dysrhythmia    GERD (gastroesophageal reflux disease)    Guillain-Barre syndrome (HCC)    History of hiatal hernia    Hypertension    Seasonal allergies     Past Surgical History:  Procedure Laterality Date   ANTERIOR CERVICAL DECOMP/DISCECTOMY FUSION N/A 04/23/2013   Procedure: ANTERIOR CERVICAL DECOMPRESSION/DISCECTOMY FUSION 3 LEVELS;  Surgeon: Catalina HERO Stains, MD;  Location: [facility];  Service: Neurosurgery;  Laterality: N/A;  Cervical three-four, cervical four-five, cervical five-six Anterior cervical decompression/diskectomy/fusion   APPENDECTOMY     BIOPSY  02/12/2023   Procedure: BIOPSY;  Surgeon: Cindie Carlin POUR, DO;  Location: [facility];  Service: Endoscopy;;   CARDIOVERSION N/A 07/16/2023   Procedure: CARDIOVERSION;  Surgeon: Francyne Headland, MD;   Location: [facility];  Service: Cardiovascular;  Laterality: N/A;   CERVICAL FUSION     C7   COLONOSCOPY WITH PROPOFOL  N/A 02/12/2023   Procedure: COLONOSCOPY WITH PROPOFOL ;  Surgeon: Cindie Carlin POUR, DO;  Location: [facility];  Service: Endoscopy;  Laterality: N/A;  12:30pm, asa 3   ESOPHAGOGASTRODUODENOSCOPY (EGD) WITH PROPOFOL  N/A 02/12/2023   Procedure: ESOPHAGOGASTRODUODENOSCOPY (EGD) WITH PROPOFOL ;  Surgeon: Cindie Carlin POUR, DO;  Location: [facility];  Service: Endoscopy;  Laterality: N/A;   POLYPECTOMY  02/12/2023   Procedure: POLYPECTOMY INTESTINAL;  Surgeon: Cindie Carlin POUR, DO;  Location: [facility];  Service: Endoscopy;;    Current Outpatient Medications  Medication Sig Dispense Refill   acetaminophen  (TYLENOL ) 500 MG tablet Take 1,000 mg by mouth every 6 (six) hours as needed for moderate pain or mild pain.     diclofenac Sodium (VOLTAREN) 1 % GEL Apply 1 Application topically 4 (four) times daily as needed (pain).     diltiazem  (CARDIZEM ) 30 MG tablet Take 1 tablet (30 mg total) by mouth every 6 (six) hours as needed. 40 tablet 2   diphenhydrAMINE  (BENADRYL ) 25 MG tablet Take 25 mg by mouth at bedtime as needed for sleep.     ELIQUIS  5 MG TABS tablet TAKE 1 TABLET BY MOUTH TWICE A DAY 60 tablet 6   Ferrous Sulfate  (IRON ) 325 (65 Fe) MG TABS 1 tablet Orally Once a day     Glycerin -Hypromellose-PEG 400 (VISINE DRY EYE) 0.2-0.2-1 % SOLN Place 1 drop into both eyes 3 (three) times daily as needed (dry/irritated eyes.).     loratadine  (CLARITIN ) 10 MG tablet Take 10  mg by mouth daily as needed for allergies.     Multiple Vitamin (MULTIVITAMIN WITH MINERALS) TABS Take 1 tablet by mouth daily. Centrum     Omega-3 Fatty Acids (FISH OIL) 1200 MG CAPS Take 1,200 mg by mouth daily.     omeprazole (PRILOSEC) 20 MG capsule Take 20 mg by mouth daily.     oxyCODONE -acetaminophen  (PERCOCET/ROXICET) 5-325 MG tablet Take 1-2 tablets by mouth every 4 (four) hours as needed for  severe pain. (Patient taking differently: Take 1 tablet by mouth daily as needed for severe pain (pain score 7-10).) 60 tablet 0   Oxymetazoline HCl (SINEX LONG-ACTING NA) Place 1 spray into the nose 2 (two) times daily as needed (congestion).     Probiotic CAPS Take 1 capsule by mouth daily.     zinc  gluconate 50 MG tablet Take 50 mg by mouth daily.     No current facility-administered medications for this visit.    Allergies as of 07/10/2024 - Review Complete 06/23/2024  Allergen Reaction Noted   Penicillins Rash 04/17/2013    No family history on file.  Social History   Socioeconomic History   Marital status: Married    Spouse name: Not on file   Number of children: Not on file   Years of education: Not on file   Highest education level: Not on file  Occupational History   Not on file  Tobacco Use   Smoking status: Former   Smokeless tobacco: Never  Vaping Use   Vaping status: Former  Substance and Sexual Activity   Alcohol use: Yes    Alcohol/week: 1.0 standard drink of alcohol    Types: 1 Cans of beer per week    Comment: socially   Drug use: No   Sexual activity: Not on file  Other Topics Concern   Not on file  Social History Narrative   Not on file   Social Drivers of Health   Financial Resource Strain: Not on file  Food Insecurity: No Food Insecurity (04/27/2023)   Hunger Vital Sign    Worried About Running Out of Food in the Last Year: Never true    Ran Out of Food in the Last Year: Never true  Transportation Needs: No Transportation Needs (04/27/2023)   PRAPARE - Administrator, Civil Service (Medical): No    Lack of Transportation (Non-Medical): No  Physical Activity: Not on file  Stress: Not on file  Social Connections: Not on file    Subjective: Review of Systems  Constitutional:  Negative for chills and fever.  HENT:  Negative for congestion and hearing loss.   Eyes:  Negative for blurred vision and double vision.  Respiratory:   Negative for cough and shortness of breath.   Cardiovascular:  Negative for chest pain and palpitations.  Gastrointestinal:  Negative for abdominal pain, blood in stool, constipation, diarrhea, heartburn, melena and vomiting.  Genitourinary:  Negative for dysuria and urgency.  Musculoskeletal:  Negative for joint pain and myalgias.  Skin:  Negative for itching and rash.  Neurological:  Negative for dizziness and headaches.  Psychiatric/Behavioral:  Negative for depression. The patient is not nervous/anxious.      Objective: There were no vitals taken for this visit. Physical Exam Constitutional:      Appearance: Normal appearance.  HENT:     Head: Normocephalic and atraumatic.  Eyes:     Extraocular Movements: Extraocular movements intact.     Conjunctiva/sclera: Conjunctivae normal.  Cardiovascular:     Rate  and Rhythm: Normal rate and regular rhythm.  Pulmonary:     Effort: Pulmonary effort is normal.     Breath sounds: Normal breath sounds.  Abdominal:     General: Bowel sounds are normal.     Palpations: Abdomen is soft.  Musculoskeletal:        General: Normal range of motion.     Cervical back: Normal range of motion and neck supple.  Skin:    General: Skin is warm.  Neurological:     General: No focal deficit present.     Mental Status: He is alert and oriented to person, place, and time.  Psychiatric:        Mood and Affect: Mood normal.        Behavior: Behavior normal.      Assessment: *Stage II cecal adenocarcinoma status post right hemicolectomy *Chronic GERD-well-controlled omeprazole daily  Plan: Will schedule for surveillance colonoscopy.The risks including infection, bleed, or perforation as well as benefits, limitations, alternatives and imponderables have been reviewed with the patient. Questions have been answered. All parties agreeable.  Patient will need to hold his Eliquis  x 2 days prior to procedure.  Discussed with him and his wife increased  risk of cardiovascular event during this time and he understands.  Echocardiogram reviewed.  Chads2Vasc score of 2.  Does not require cardiac clearance.  Chronic GERD well-controlled omeprazole daily.  Will continue.  07/10/2024 2:33 PM   Disclaimer: This note was dictated with voice recognition software. Similar sounding words can inadvertently be transcribed and may not be corrected upon review.

## 2024-07-10 NOTE — Patient Instructions (Signed)
 Will schedule you for colonoscopy for surveillance purposes given your history of cancer.  Continue omeprazole daily for your acid reflux.  It was very nice seeing you again today.  Dr. Cindie

## 2024-08-04 ENCOUNTER — Telehealth: Payer: Self-pay

## 2024-08-04 ENCOUNTER — Other Ambulatory Visit: Payer: Self-pay | Admitting: *Deleted

## 2024-08-04 DIAGNOSIS — I480 Paroxysmal atrial fibrillation: Secondary | ICD-10-CM

## 2024-08-04 MED ORDER — APIXABAN 5 MG PO TABS: 5.0000 mg | ORAL_TABLET | Freq: Two times a day (BID) | ORAL | 5 refills | Status: AC

## 2024-08-04 NOTE — Telephone Encounter (Signed)
 I spoke to the patient's wife and gave her scheduler's phone # to schedule Cardiology appt.

## 2024-08-04 NOTE — Telephone Encounter (Signed)
 Eliquis  5mg  refill request received. Patient is 73 years old, weight-76.8kg, Crea-1.12 on 06/17/24, Diagnosis-Afib, and last seen by Dr. Alvan on 07/11/23 (was due in 6 months per OV note)-needs appt. Dose is appropriate based on dosing criteria.   Pt needs an appt with Cardiologist. Sending a note to Schedulers to reach out.

## 2024-08-05 ENCOUNTER — Telehealth: Payer: Self-pay | Admitting: *Deleted

## 2024-08-05 NOTE — Telephone Encounter (Signed)
 Daughter called in to reschedule procedure for Friday with Dr. Cindie. He has been rescheduled to 9/9 at 1pm. Aware will mail new instructions. Message also sent to endo.

## 2024-08-25 ENCOUNTER — Ambulatory Visit: Admitting: Cardiology

## 2024-08-26 ENCOUNTER — Ambulatory Visit (HOSPITAL_BASED_OUTPATIENT_CLINIC_OR_DEPARTMENT_OTHER): Admitting: Certified Registered Nurse Anesthetist

## 2024-08-26 ENCOUNTER — Encounter (HOSPITAL_COMMUNITY)
Admission: RE | Disposition: A | Discharge status: Home / Self Care | Payer: Self-pay | Source: Home / Self Care | Attending: Internal Medicine

## 2024-08-26 ENCOUNTER — Ambulatory Visit (HOSPITAL_COMMUNITY)
Admission: RE | Admit: 2024-08-26 | Discharge: 2024-08-26 | Disposition: A | Discharge status: Home / Self Care | Attending: Internal Medicine | Admitting: Internal Medicine

## 2024-08-26 ENCOUNTER — Encounter (HOSPITAL_COMMUNITY): Payer: Self-pay | Admitting: Internal Medicine

## 2024-08-26 ENCOUNTER — Other Ambulatory Visit: Payer: Self-pay

## 2024-08-26 ENCOUNTER — Ambulatory Visit (HOSPITAL_COMMUNITY): Admitting: Certified Registered Nurse Anesthetist

## 2024-08-26 DIAGNOSIS — I4891 Unspecified atrial fibrillation: Secondary | ICD-10-CM

## 2024-08-26 DIAGNOSIS — D649 Anemia, unspecified: Secondary | ICD-10-CM | POA: Diagnosis not present

## 2024-08-26 DIAGNOSIS — Z1211 Encounter for screening for malignant neoplasm of colon: Secondary | ICD-10-CM | POA: Diagnosis present

## 2024-08-26 DIAGNOSIS — D123 Benign neoplasm of transverse colon: Secondary | ICD-10-CM | POA: Insufficient documentation

## 2024-08-26 DIAGNOSIS — Z7901 Long term (current) use of anticoagulants: Secondary | ICD-10-CM | POA: Diagnosis not present

## 2024-08-26 DIAGNOSIS — M1990 Unspecified osteoarthritis, unspecified site: Secondary | ICD-10-CM | POA: Insufficient documentation

## 2024-08-26 DIAGNOSIS — Z85038 Personal history of other malignant neoplasm of large intestine: Secondary | ICD-10-CM | POA: Insufficient documentation

## 2024-08-26 DIAGNOSIS — K635 Polyp of colon: Secondary | ICD-10-CM | POA: Diagnosis not present

## 2024-08-26 DIAGNOSIS — K648 Other hemorrhoids: Secondary | ICD-10-CM | POA: Diagnosis not present

## 2024-08-26 DIAGNOSIS — I10 Essential (primary) hypertension: Secondary | ICD-10-CM | POA: Diagnosis not present

## 2024-08-26 DIAGNOSIS — D125 Benign neoplasm of sigmoid colon: Secondary | ICD-10-CM

## 2024-08-26 DIAGNOSIS — Z79899 Other long term (current) drug therapy: Secondary | ICD-10-CM | POA: Diagnosis not present

## 2024-08-26 DIAGNOSIS — K5730 Diverticulosis of large intestine without perforation or abscess without bleeding: Secondary | ICD-10-CM | POA: Diagnosis not present

## 2024-08-26 DIAGNOSIS — K449 Diaphragmatic hernia without obstruction or gangrene: Secondary | ICD-10-CM | POA: Diagnosis not present

## 2024-08-26 DIAGNOSIS — R000 Tachycardia, unspecified: Secondary | ICD-10-CM | POA: Diagnosis not present

## 2024-08-26 DIAGNOSIS — Z87891 Personal history of nicotine dependence: Secondary | ICD-10-CM | POA: Diagnosis not present

## 2024-08-26 DIAGNOSIS — K219 Gastro-esophageal reflux disease without esophagitis: Secondary | ICD-10-CM | POA: Diagnosis not present

## 2024-08-26 HISTORY — PX: COLONOSCOPY: SHX5424

## 2024-08-26 SURGERY — COLONOSCOPY
Anesthesia: General

## 2024-08-26 MED ORDER — PROPOFOL 500 MG/50ML IV EMUL
INTRAVENOUS | Status: DC | PRN
  Administered 2024-08-26: 150 ug/kg/min via INTRAVENOUS
  Administered 2024-08-26: 100 mg via INTRAVENOUS

## 2024-08-26 MED ORDER — LACTATED RINGERS IV SOLN: INTRAVENOUS | Status: DC

## 2024-08-26 NOTE — Discharge Instructions (Signed)
  Colonoscopy Discharge Instructions  Read the instructions outlined below and refer to this sheet in the next few weeks. These discharge instructions provide you with general information on caring for yourself after you leave the hospital. Your doctor may also give you specific instructions. While your treatment has been planned according to the most current medical practices available, unavoidable complications occasionally occur.   ACTIVITY You may resume your regular activity, but move at a slower pace for the next 24 hours.  Take frequent rest periods for the next 24 hours.  Walking will help get rid of the air and reduce the bloated feeling in your belly (abdomen).  No driving for 24 hours (because of the medicine (anesthesia) used during the test).   Do not sign any important legal documents or operate any machinery for 24 hours (because of the anesthesia used during the test).  NUTRITION Drink plenty of fluids.  You may resume your normal diet as instructed by your doctor.  Begin with a light meal and progress to your normal diet. Heavy or fried foods are harder to digest and may make you feel sick to your stomach (nauseated).  Avoid alcoholic beverages for 24 hours or as instructed.  MEDICATIONS You may resume your normal medications unless your doctor tells you otherwise.  WHAT YOU CAN EXPECT TODAY Some feelings of bloating in the abdomen.  Passage of more gas than usual.  Spotting of blood in your stool or on the toilet paper.  IF YOU HAD POLYPS REMOVED DURING THE COLONOSCOPY: No aspirin products for 7 days or as instructed.  No alcohol for 7 days or as instructed.  Eat a soft diet for the next 24 hours.  FINDING OUT THE RESULTS OF YOUR TEST Not all test results are available during your visit. If your test results are not back during the visit, make an appointment with your caregiver to find out the results. Do not assume everything is normal if you have not heard from your  caregiver or the medical facility. It is important for you to follow up on all of your test results.  SEEK IMMEDIATE MEDICAL ATTENTION IF: You have more than a spotting of blood in your stool.  Your belly is swollen (abdominal distention).  You are nauseated or vomiting.  You have a temperature over 101.  You have abdominal pain or discomfort that is severe or gets worse throughout the day.   Your colonoscopy revealed 3 polyp(s) which I removed successfully. Await pathology results, my office will contact you. I recommend repeating colonoscopy in 3 years for surveillance purposes.   You also have diverticulosis and internal hemorrhoids. I would recommend increasing fiber in your diet or adding OTC Benefiber/Metamucil. Be sure to drink at least 4 to 6 glasses of water daily. Follow-up with GI as needed.   I hope you have a great rest of your week!  Elon Alas. Abbey Chatters, D.O. Gastroenterology and Hepatology [facility]

## 2024-08-26 NOTE — Transfer of Care (Signed)
 Immediate Anesthesia Transfer of Care Note  Patient: Bradley Trujillo  Procedure(s) Performed: COLONOSCOPY  Patient Location: Endoscopy Unit  Anesthesia Type:General  Level of Consciousness: drowsy  Airway & Oxygen Therapy: Patient Spontanous Breathing  Post-op Assessment: Report given to RN and Post -op Vital signs reviewed and stable  Post vital signs: Reviewed and stable  Last Vitals:  Vitals Value Taken Time  BP 99/55 08/26/24 13:02  Temp 37 C 08/26/24 13:02  Pulse 95 08/26/24 13:02  Resp 14 08/26/24 13:02  SpO2 96 % 08/26/24 13:02    Last Pain:  Vitals:   08/26/24 1302  TempSrc: Oral  PainSc:       Patients Stated Pain Goal: 6 (08/26/24 1143)  Complications: No notable events documented.

## 2024-08-26 NOTE — Op Note (Signed)
 [facility] Patient Name: Bradley Trujillo Procedure Date: 08/26/2024 12:37 PM MRN: [id number] Date of Birth: [birth year] Attending MD: Carlin POUR. Cindie , [place], [id number] CSN: [id number] Age: 73 Admit Type: Outpatient Procedure:                Colonoscopy Indications:              High risk colon cancer surveillance: Personal                            history of colon cancer Providers:                Carlin POUR. Cindie, DO, Crystal Page, Jon Loge Referring MD:              Medicines:                See the Anesthesia note for documentation of the                            administered medications Complications:            No immediate complications. Estimated Blood Loss:     Estimated blood loss was minimal. Procedure:                Pre-Anesthesia Assessment:                           - The anesthesia plan was to use monitored                            anesthesia care (MAC).                           After obtaining informed consent, the colonoscope                            was passed under direct vision. Throughout the                            procedure, the patient's blood pressure, pulse, and                            oxygen saturations were monitored continuously. The                            [id number]-[id number] ([id number]) Peds Colon was introduced                            through the anus and advanced to the the                            ileocolonic anastomosis. The colonoscopy was                            performed without difficulty. The patient tolerated  the procedure well. The quality of the bowel                            preparation was evaluated using the BBPS [facility]                            Bowel Preparation Scale) with scores of: Right                            Colon = 3, Transverse Colon = 3 and Left Colon = 3                            (entire mucosa seen well with no residual staining,                             small fragments of stool or opaque liquid). The                            total BBPS score equals 9. Scope In: 12:46:18 PM Scope Out: 12:59:29 PM Scope Withdrawal Time: 0 hours 10 minutes 22 seconds  Total Procedure Duration: 0 hours 13 minutes 11 seconds  Findings:      Non-bleeding internal hemorrhoids were found.      Multiple medium-mouthed and small-mouthed diverticula were found in the       sigmoid colon and descending colon.      Two sessile polyps were found in the transverse colon. The polyps were 4       to 6 mm in size. These polyps were removed with a cold snare. Resection       and retrieval were complete.      A 4 mm polyp was found in the sigmoid colon. The polyp was sessile. The       polyp was removed with a cold snare. Resection and retrieval were       complete.      The exam was otherwise without abnormality. Impression:               - Non-bleeding internal hemorrhoids.                           - Diverticulosis in the sigmoid colon and in the                            descending colon.                           - Two 4 to 6 mm polyps in the transverse colon,                            removed with a cold snare. Resected and retrieved.                           - One 4 mm polyp in the sigmoid colon, removed with  a cold snare. Resected and retrieved.                           - The examination was otherwise normal. Moderate Sedation:      Per Anesthesia Care Recommendation:           - Patient has a contact number available for                            emergencies. The signs and symptoms of potential                            delayed complications were discussed with the                            patient. Return to normal activities tomorrow.                            Written discharge instructions were provided to the                            patient.                           - Resume previous diet.                            - Continue present medications.                           - Await pathology results.                           - Repeat colonoscopy in 3 years for surveillance.                           - Return to GI clinic PRN. Procedure Code(s):        --- Professional ---                           [phone], Colonoscopy, flexible; with removal of                            tumor(s), polyp(s), or other lesion(s) by snare                            technique Diagnosis Code(s):        --- Professional ---                           S14.961, Personal history of other malignant                            neoplasm of large intestine                           K64.8, Other hemorrhoids  D12.3, Benign neoplasm of transverse colon (hepatic                            flexure or splenic flexure)                           D12.5, Benign neoplasm of sigmoid colon                           K57.30, Diverticulosis of large intestine without                            perforation or abscess without bleeding CPT copyright 2022 American Medical Association. All rights reserved. The codes documented in this report are preliminary and upon coder review may  be revised to meet current compliance requirements. Carlin POUR. Cindie, DO Carlin POUR. [name], DO 08/26/2024 1:06:02 PM This report has been signed electronically. Number of Addenda: 0

## 2024-08-26 NOTE — Anesthesia Preprocedure Evaluation (Addendum)
 Anesthesia Evaluation  Patient identified by MRN, date of birth, ID band Patient awake    Reviewed: Allergy & Precautions, H&P , NPO status , Patient's Chart, lab work & pertinent test results  Airway Mallampati: II  TM Distance: >3 FB Neck ROM: Full    Dental  (+) Lower Dentures, Upper Dentures   Pulmonary former smoker   Pulmonary exam normal breath sounds clear to auscultation       Cardiovascular hypertension, Normal cardiovascular exam+ dysrhythmias Atrial Fibrillation  Rhythm:Irregular Rate:Tachycardia  Cardioversion 2024-unsuccessful; takes Cardizem  prn HR slightly elevated today   Neuro/Psych  Neuromuscular disease  negative psych ROS   GI/Hepatic Neg liver ROS, hiatal hernia,GERD  ,,  Endo/Other  negative endocrine ROS    Renal/GU negative Renal ROS  negative genitourinary   Musculoskeletal  (+) Arthritis ,    Abdominal   Peds negative pediatric ROS (+)  Hematology  (+) Blood dyscrasia, anemia   Anesthesia Other Findings   Reproductive/Obstetrics negative OB ROS                              Anesthesia Physical Anesthesia Plan  ASA: 3  Anesthesia Plan: General   Post-op Pain Management:    Induction: Intravenous  PONV Risk Score and Plan:   Airway Management Planned: Nasal Cannula  Additional Equipment:   Intra-op Plan:   Post-operative Plan:   Informed Consent: I have reviewed the patients History and Physical, chart, labs and discussed the procedure including the risks, benefits and alternatives for the proposed anesthesia with the patient or authorized representative who has indicated his/her understanding and acceptance.     Dental advisory given  Plan Discussed with: CRNA  Anesthesia Plan Comments:          Anesthesia Quick Evaluation

## 2024-08-26 NOTE — Anesthesia Postprocedure Evaluation (Signed)
 Anesthesia Post Note  Patient: Bradley Trujillo  Procedure(s) Performed: COLONOSCOPY  Patient location during evaluation: PACU Anesthesia Type: General Level of consciousness: awake and alert Pain management: pain level controlled Vital Signs Assessment: post-procedure vital signs reviewed and stable Respiratory status: spontaneous breathing, nonlabored ventilation, respiratory function stable and patient connected to nasal cannula oxygen Cardiovascular status: blood pressure returned to baseline and stable Postop Assessment: no apparent nausea or vomiting Anesthetic complications: no   No notable events documented.   Last Vitals:  Vitals:   08/26/24 1310 08/26/24 1320  BP: 92/73 115/77  Pulse: 93   Resp: 15   Temp:    SpO2: 96%     Last Pain:  Vitals:   08/26/24 1310  TempSrc:   PainSc: 0-No pain                 Andrea Limes

## 2024-08-26 NOTE — H&P (Signed)
 Primary Care Physician:  Renato Dorothey HERO, NP Primary Gastroenterologist:  Dr. Cindie  Pre-Procedure History & Physical: HPI:  Bradley Trujillo is a 73 y.o. male is here for a colonoscopy to be performed for surveillance purposes, personal history of adenocarcinoma of the colon s/p resection.   Past Medical History:  Diagnosis Date   Arthritis    Dysrhythmia    GERD (gastroesophageal reflux disease)    Guillain-Barre syndrome (HCC)    History of hiatal hernia    Hypertension    Seasonal allergies     Past Surgical History:  Procedure Laterality Date   ANTERIOR CERVICAL DECOMP/DISCECTOMY FUSION N/A 04/23/2013   Procedure: ANTERIOR CERVICAL DECOMPRESSION/DISCECTOMY FUSION 3 LEVELS;  Surgeon: Catalina HERO Stains, MD;  Location: [facility];  Service: Neurosurgery;  Laterality: N/A;  Cervical three-four, cervical four-five, cervical five-six Anterior cervical decompression/diskectomy/fusion   APPENDECTOMY     BIOPSY  02/12/2023   Procedure: BIOPSY;  Surgeon: Cindie Carlin POUR, DO;  Location: [facility];  Service: Endoscopy;;   CARDIOVERSION N/A 07/16/2023   Procedure: CARDIOVERSION;  Surgeon: Francyne Headland, MD;  Location: [facility];  Service: Cardiovascular;  Laterality: N/A;   CERVICAL FUSION     C7   COLONOSCOPY WITH PROPOFOL  N/A 02/12/2023   Procedure: COLONOSCOPY WITH PROPOFOL ;  Surgeon: Cindie Carlin POUR, DO;  Location: [facility];  Service: Endoscopy;  Laterality: N/A;  12:30pm, asa 3   ESOPHAGOGASTRODUODENOSCOPY (EGD) WITH PROPOFOL  N/A 02/12/2023   Procedure: ESOPHAGOGASTRODUODENOSCOPY (EGD) WITH PROPOFOL ;  Surgeon: Cindie Carlin POUR, DO;  Location: [facility];  Service: Endoscopy;  Laterality: N/A;   POLYPECTOMY  02/12/2023   Procedure: POLYPECTOMY INTESTINAL;  Surgeon: Cindie Carlin POUR, DO;  Location: [facility];  Service: Endoscopy;;    Prior to Admission medications   Medication Sig Start Date End Date Taking? Authorizing Provider  diphenhydrAMINE  (BENADRYL )  25 MG tablet Take 25 mg by mouth at bedtime as needed for sleep.   Yes [provider]  Ferrous Sulfate  (IRON ) 325 (65 Fe) MG TABS 1 tablet Orally Once a day   Yes [provider]  loratadine  (CLARITIN ) 10 MG tablet Take 10 mg by mouth daily as needed for allergies.   Yes [provider]  Multiple Vitamin (MULTIVITAMIN WITH MINERALS) TABS Take 1 tablet by mouth daily. Centrum   Yes [provider]  omeprazole (PRILOSEC) 20 MG capsule Take 20 mg by mouth daily.   Yes [provider]  oxyCODONE -acetaminophen  (PERCOCET/ROXICET) 5-325 MG tablet Take 1-2 tablets by mouth every 4 (four) hours as needed for severe pain. 09/18/20  Yes Costella, Vincent J, PA-C  Oxymetazoline HCl (SINEX LONG-ACTING NA) Place 1 spray into the nose 2 (two) times daily as needed (congestion).   Yes [provider]  Probiotic CAPS Take 1 capsule by mouth daily.   Yes [provider]  zinc  gluconate 50 MG tablet Take 50 mg by mouth daily.   Yes [provider]  acetaminophen  (TYLENOL ) 500 MG tablet Take 1,000 mg by mouth every 6 (six) hours as needed for moderate pain or mild pain.    [provider]  apixaban  (ELIQUIS ) 5 MG TABS tablet Take 1 tablet (5 mg total) by mouth 2 (two) times daily. 08/04/24   Tolia, Sunit, DO  diclofenac Sodium (VOLTAREN) 1 % GEL Apply 1 Application topically 4 (four) times daily as needed (pain). Patient not taking: Reported on 07/10/2024    [provider]  diltiazem  (CARDIZEM ) 30 MG tablet Take 1 tablet (  30 mg total) by mouth every 6 (six) hours as needed. Patient taking differently: Take 30 mg by mouth every 6 (six) hours as needed. Never taken 06/15/23   Alvan Ronal BRAVO, MD  Glycerin -Hypromellose-PEG 400 (VISINE DRY EYE) 0.2-0.2-1 % SOLN Place 1 drop into both eyes 3 (three) times daily as needed (dry/irritated eyes.).    [provider]  Omega-3 Fatty Acids (FISH OIL) 1200 MG CAPS Take 1,200 mg by mouth  daily. Patient not taking: Reported on 07/10/2024    [provider]    Allergies as of 07/10/2024 - Review Complete 07/10/2024  Allergen Reaction Noted   Penicillins Rash 04/17/2013    No family history on file.  Social History   Socioeconomic History   Marital status: Married    Spouse name: Not on file   Number of children: Not on file   Years of education: Not on file   Highest education level: Not on file  Occupational History   Not on file  Tobacco Use   Smoking status: Former   Smokeless tobacco: Never  Vaping Use   Vaping status: Former  Substance and Sexual Activity   Alcohol use: Yes    Alcohol/week: 1.0 standard drink of alcohol    Types: 1 Cans of beer per week    Comment: socially   Drug use: No   Sexual activity: Not on file  Other Topics Concern   Not on file  Social History Narrative   Not on file   Social Drivers of Health   Financial Resource Strain: Not on file  Food Insecurity: No Food Insecurity (04/27/2023)   Hunger Vital Sign    Worried About Running Out of Food in the Last Year: Never true    Ran Out of Food in the Last Year: Never true  Transportation Needs: No Transportation Needs (04/27/2023)   PRAPARE - Administrator, Civil Service (Medical): No    Lack of Transportation (Non-Medical): No  Physical Activity: Not on file  Stress: Not on file  Social Connections: Not on file  Intimate Partner Violence: Not At Risk (04/27/2023)   Humiliation, Afraid, Rape, and Kick questionnaire    Fear of Current or Ex-Partner: No    Emotionally Abused: No    Physically Abused: No    Sexually Abused: No    Review of Systems: See HPI, otherwise negative ROS  Physical Exam: Vital signs in last 24 hours:     General:   Alert,  Well-developed, well-nourished, pleasant and cooperative in NAD Head:  Normocephalic and atraumatic. Eyes:  Sclera clear, no icterus.   Conjunctiva pink. Ears:  Normal auditory acuity. Nose:  No  deformity, discharge,  or lesions. Msk:  Symmetrical without gross deformities. Normal posture. Extremities:  Without clubbing or edema. Neurologic:  Alert and  oriented x4;  grossly normal neurologically. Skin:  Intact without significant lesions or rashes. Psych:  Alert and cooperative. Normal mood and affect.  Impression/Plan: Bradley Trujillo is here for a colonoscopy to be performed for surveillance purposes, personal history of adenocarcinoma of the colon s/p resection.   The risks of the procedure including infection, bleed, or perforation as well as benefits, limitations, alternatives and imponderables have been reviewed with the patient. Questions have been answered. All parties agreeable.

## 2024-08-27 ENCOUNTER — Encounter (HOSPITAL_COMMUNITY): Payer: Self-pay | Admitting: Internal Medicine

## 2024-08-27 ENCOUNTER — Ambulatory Visit: Payer: Self-pay | Admitting: Internal Medicine

## 2024-08-27 LAB — SURGICAL PATHOLOGY

## 2024-09-02 ENCOUNTER — Encounter: Payer: Self-pay | Admitting: Cardiology

## 2024-09-02 ENCOUNTER — Ambulatory Visit: Attending: Cardiology | Admitting: Cardiology

## 2024-09-02 ENCOUNTER — Ambulatory Visit (INDEPENDENT_AMBULATORY_CARE_PROVIDER_SITE_OTHER)

## 2024-09-02 VITALS — BP 151/79 | HR 85 | Ht 66.0 in | Wt 170.0 lb

## 2024-09-02 DIAGNOSIS — I10 Essential (primary) hypertension: Secondary | ICD-10-CM | POA: Diagnosis not present

## 2024-09-02 DIAGNOSIS — G610 Guillain-Barre syndrome: Secondary | ICD-10-CM | POA: Diagnosis not present

## 2024-09-02 DIAGNOSIS — Z7901 Long term (current) use of anticoagulants: Secondary | ICD-10-CM | POA: Diagnosis not present

## 2024-09-02 DIAGNOSIS — I480 Paroxysmal atrial fibrillation: Secondary | ICD-10-CM

## 2024-09-02 DIAGNOSIS — I4811 Longstanding persistent atrial fibrillation: Secondary | ICD-10-CM

## 2024-09-02 NOTE — Patient Instructions (Signed)
 Medication Instructions:  Your physician recommends that you continue on your current medications as directed. Please refer to the Current Medication list given to you today.  *If you need a refill on your cardiac medications before your next appointment, please call your pharmacy*  Lab Work: NONE  If you have labs (blood work) drawn today and your tests are completely normal, you will receive your results only by: MyChart Message (if you have MyChart) OR A paper copy in the mail If you have any lab test that is abnormal or we need to change your treatment, we will call you to review the results.  Testing/Procedures: Your physician has requested that you wear a heart monitor.   Follow-Up: At [facility], you and your health needs are our priority.  As part of our continuing mission to provide you with exceptional heart care, our providers are all part of one team.  This team includes your primary Cardiologist (physician) and Advanced Practice Providers or APPs (Physician Assistants and Nurse Practitioners) who all work together to provide you with the care you need, when you need it.  Your next appointment:   3 month(s)  Provider:   Madonna Large, DO     Other Instructions ZIO XT- Long Term Monitor Instructions  Your physician has requested you wear a ZIO patch monitor for 14 days.  This is a single patch monitor. [employer] supplies one patch monitor per enrollment. Additional stickers are not available. Please do not apply patch if you will be having a Nuclear Stress Test,  Cardiac CT, MRI, or Chest Xray during the period you would be wearing the  monitor. The patch cannot be worn during these tests. You cannot remove and re-apply the  ZIO XT patch monitor.  Your ZIO patch monitor will be mailed 3 day USPS to your address on file. It may take 3-5 days  to receive your monitor after you have been enrolled.  Once you have received your monitor, please review the enclosed  instructions. Your monitor  has already been registered assigning a specific monitor serial # to you.  Billing and Patient Assistance Program Information  We have supplied [employer] with any of your insurance information on file for billing purposes. [employer] offers a sliding scale Patient Assistance Program for patients that do not have  insurance, or whose insurance does not completely cover the cost of the ZIO monitor.  You must apply for the Patient Assistance Program to qualify for this discounted rate.  To apply, please call [employer] at [phone], select option 4, select option 2, ask to apply for  Patient Assistance Program. Meredeth will ask your household income, and how many people  are in your household. They will quote your out-of-pocket cost based on that information.  [employer] will also be able to set up a 32-month, interest-free payment plan if needed.  Applying the monitor   Shave hair from upper left chest.  Hold abrader disc by orange tab. Rub abrader in 40 strokes over the upper left chest as  indicated in your monitor instructions.  Clean area with 4 enclosed alcohol pads. Let dry.  Apply patch as indicated in monitor instructions. Patch will be placed under collarbone on left  side of chest with arrow pointing upward.  Rub patch adhesive wings for 2 minutes. Remove white label marked 1. Remove the white  label marked 2. Rub patch adhesive wings for 2 additional minutes.  While looking in a mirror, press and release button in center  of patch. A small green light will  flash 3-4 times. This will be your only indicator that the monitor has been turned on.  Do not shower for the first 24 hours. You may shower after the first 24 hours.  Press the button if you feel a symptom. You will hear a small click. Record Date, Time and  Symptom in the Patient Logbook.  When you are ready to remove the patch, follow instructions on the last 2 pages of Patient  Logbook. Stick patch  monitor onto the last page of Patient Logbook.  Place Patient Logbook in the blue and white box. Use locking tab on box and tape box closed  securely. The blue and white box has prepaid postage on it. Please place it in the mailbox as  soon as possible. Your physician should have your test results approximately 7 days after the  monitor has been mailed back to [facility].  Call [facility] Customer Care at [phone] if you have questions regarding  your ZIO XT patch monitor. Call them immediately if you see an orange light blinking on your  monitor.  If your monitor falls off in less than 4 days, contact our Monitor department at [phone].  If your monitor becomes loose or falls off after 4 days call [employer] at [phone] for  suggestions on securing your monitor

## 2024-09-02 NOTE — Progress Notes (Unsigned)
 Enrolled for [employer] to mail a ZIO XT long term holter monitor to the patients address on file.   Dr. Odis Hollingshead to read.

## 2024-09-02 NOTE — Progress Notes (Signed)
 Cardiology Office Note:  .   Date:  09/02/2024  ID:  Bradley Trujillo, DOB 1951/07/13, MRN [id number] PCP:  Renato Dorothey HERO, NP  Former Cardiology Providers: Dr. Ronal Ross [facility] Providers Cardiologist:  Madonna Large, DO , [facility] (established care 09/02/24) Electrophysiologist:  None  Click to update primary MD,subspecialty MD or APP then REFRESH:1}    Chief Complaint  Patient presents with   Follow-up    Reestablish care, atrial fibrillation    History of Present Illness: .   Michael Walrath is a 73 y.o. Caucasian male whose past medical history and cardiovascular risk factors includes: Persistent atrial fibrillation, history of direct-current cardioversion, GI bleed requiring blood transfusion,GERD, Guillain-barre, HTN, cecal adenocarcinoma, former smoker.  Formally under the care of Dr. Ronal Ross who last saw Keiondre Colee back in July 2024. I am seeing him for the first time to re-establishing care.   In February 2024 patient was found to have significant anemia with a hemoglobin of 6.4 g/dL.  He required PRBC transfusion as well as iron .  Colonoscopy was performed which noted a cecal mass biopsy noted adenocarcinoma. He underwent robotic assisted laparoscopic right hemicolectomy on 04/27/2023. He tolerated the procedure well. He does not require cancer therapy with good margins. His hgb is now stable.   Patient is accompanied by his wife at today's office visit.  Patient used to work as a first responder and in hindsight realizes that he may have been in A-fib prior to his diagnosis.  He still appreciates the irregularity in his pulse and thinks that he may be going in and out of A-fib.  He is currently being treated with rate control strategy.  He denies anginal chest pain or heart failure symptoms.  Overall functional capacity is limited given his underlying Myriam Shine.  Does not endorse any evidence of bleeding.  Was given Cardizem  to use on a as needed basis which he  has not required.  Review of Systems: .   Review of Systems  Cardiovascular:  Negative for chest pain, claudication, irregular heartbeat, leg swelling, near-syncope, orthopnea, palpitations, paroxysmal nocturnal dyspnea and syncope.  Respiratory:  Negative for shortness of breath.   Hematologic/Lymphatic: Negative for bleeding problem.    Studies Reviewed:   EKG: EKG Interpretation Date/Time:  Tuesday September 02 2024 15:22:29 EDT Ventricular Rate:  85 PR Interval:    QRS Duration:  76 QT Interval:  350 QTC Calculation: 416 R Axis:   108  Text Interpretation: Atrial fibrillation CONTROLLED VENTRICULAR RESPONSE Rightward axis Consider Septal infarct , age undetermined When compared with ECG of 16-Jul-2023 12:13, Septal infarct is now Present Confirmed by Large Madonna [id number]) on 09/02/2024 3:38:53 PM  Echocardiogram: TTE 04/12/2023 EF 60-65%, nl RV LA size 66 ml/m2 No sig valve dx  RADIOLOGY: N/A  Risk Assessment/Calculations:   Click Here to Calculate/Change CHADS2VASc Score The patient's CHADS2-VASc score is 2, indicating a 2.2% annual risk of stroke.    CHF History: No HTN History: Yes Diabetes History: No Stroke History: No Vascular Disease History: No  Labs:       Latest Ref Rng & Units 06/17/2024    2:00 PM 03/05/2024    2:54 PM 12/03/2023    2:17 PM  CBC  WBC 4.0 - 10.5 K/uL 5.7  6.1  6.4   Hemoglobin 13.0 - 17.0 g/dL 83.2  83.1  83.2   Hematocrit 39.0 - 52.0 % 51.3  51.6  51.9   Platelets 150 - 400 K/uL 204  204  226  Latest Ref Rng & Units 06/17/2024    2:00 PM 03/05/2024    2:54 PM 12/03/2023    2:17 PM  BMP  Glucose 70 - 99 mg/dL 89  899  897   BUN 8 - 23 mg/dL 12  13  16    Creatinine 0.61 - 1.24 mg/dL 8.87  8.86  8.84   Sodium 135 - 145 mmol/L 141  137  140   Potassium 3.5 - 5.1 mmol/L 4.1  4.1  3.9   Chloride 98 - 111 mmol/L 107  102  108   CO2 22 - 32 mmol/L 23  25  24    Calcium 8.9 - 10.3 mg/dL 9.5  9.7  9.5       Latest Ref Rng &  Units 06/17/2024    2:00 PM 03/05/2024    2:54 PM 12/03/2023    2:17 PM  CMP  Glucose 70 - 99 mg/dL 89  899  897   BUN 8 - 23 mg/dL 12  13  16    Creatinine 0.61 - 1.24 mg/dL 8.87  8.86  8.84   Sodium 135 - 145 mmol/L 141  137  140   Potassium 3.5 - 5.1 mmol/L 4.1  4.1  3.9   Chloride 98 - 111 mmol/L 107  102  108   CO2 22 - 32 mmol/L 23  25  24    Calcium 8.9 - 10.3 mg/dL 9.5  9.7  9.5   Total Protein 6.5 - 8.1 g/dL 7.1  7.4  7.2   Total Bilirubin 0.0 - 1.2 mg/dL 1.0  0.7  0.8   Alkaline Phos 38 - 126 U/L 67  77  80   AST 15 - 41 U/L 25  24  26    ALT 0 - 44 U/L 18  24  23      No results found for: CHOL, HDL, LDLCALC, LDLDIRECT, TRIG, CHOLHDL No results for input(s): LIPOA in the last 8760 hours. No components found for: NTPROBNP No results for input(s): PROBNP in the last 8760 hours. No results for input(s): TSH in the last 8760 hours.  Physical Exam:    Today's Vitals   09/02/24 1511  BP: (!) 151/79  Pulse: 85  SpO2: 97%  Weight: 170 lb (77.1 kg)  Height: 5' 6 (1.676 m)   Body mass index is 27.44 kg/m. Wt Readings from Last 3 Encounters:  09/02/24 170 lb (77.1 kg)  08/26/24 169 lb (76.7 kg)  07/10/24 169 lb 6.4 oz (76.8 kg)    Physical Exam  Constitutional: No distress. He appears chronically ill.  hemodynamically stable, ambulates with a walker  Neck: No JVD present.  Cardiovascular: Normal rate, S1 normal and S2 normal. An irregularly irregular rhythm present. Exam reveals no gallop, no S3 and no S4.  No murmur heard. Pulmonary/Chest: Effort normal and breath sounds normal. No stridor. He has no wheezes. He has no rales.  Musculoskeletal:        General: No edema.     Cervical back: Neck supple.  Skin: Skin is warm.     Impression & Recommendation(s):  Impression:   ICD-10-CM   1. Longstanding persistent atrial fibrillation (HCC)  I48.11 EKG 12-Lead    LONG TERM MONITOR (3-14 DAYS)    2. Long term (current) use of anticoagulants   Z79.01     3. Benign hypertension  I10     4. Guillain Barr syndrome (HCC)  G61.0        Recommendation(s):  Longstanding persistent atrial fibrillation (HCC)  Diagnosed in March 2024 Per patient he may have had for much longer, in hindsight Still appreciates likely going in and out of A-fib but not verified. Underwent direct-current cardioversion in July 2024 but reverted back to A-fib within 5 minutes Prior echocardiogram noted severe dilatation of the left atrium Shared decision was to proceed with a Zio patch to evaluate for A-fib burden. If he has episodes of sinus rhythm will refer him to A-fib clinic for consideration of antiarrhythmics +/- cardioversion vs. Evaluation of Afib ablation candidacy. If he has 100% A-fib burden patient would like to continue rate control strategy, for now. Further recommendations to follow Rate control: Diltiazem  on as needed basis. Rhythm control: N/A. Thromboembolic prophylaxis: Eliquis   Long term (current) use of anticoagulants Does not endorse evidence of bleeding. Recent labs from July 2025 via [facility] database illustrates a hemoglobin of 16.7 g/dL and renal function 8.87 mg/dL  Benign hypertension Office blood pressures are not well-controlled. Recommend monitoring blood pressures in the ambulatory setting Follow-up with PCP  Guillain Barr syndrome (HCC) Relatively stable per patient.  Orders Placed:  Orders Placed This Encounter  Procedures   LONG TERM MONITOR (3-14 DAYS)    Standing Status:   Future    Number of Occurrences:   1    Expiration Date:   09/02/2025    Where should this test be performed?:   [facility]    Does the patient have an implanted cardiac device?:   No    Prescribed days of wear:   14    Type of enrollment:   Home Enrollment    Vendor::   [employer]    Reason for Exam:   A-fib, Unspecified I48.91   EKG 12-Lead     Final Medication List:   No orders of the defined types were placed in this encounter.    There are no discontinued medications.   Current Outpatient Medications:    acetaminophen  (TYLENOL ) 500 MG tablet, Take 1,000 mg by mouth every 6 (six) hours as needed for moderate pain or mild pain., Disp: , Rfl:    apixaban  (ELIQUIS ) 5 MG TABS tablet, Take 1 tablet (5 mg total) by mouth 2 (two) times daily., Disp: 60 tablet, Rfl: 5   diphenhydrAMINE  (BENADRYL ) 25 MG tablet, Take 25 mg by mouth at bedtime as needed for sleep., Disp: , Rfl:    Ferrous Sulfate  (IRON ) 325 (65 Fe) MG TABS, 1 tablet Orally Once a day, Disp: , Rfl:    Glycerin -Hypromellose-PEG 400 (VISINE DRY EYE) 0.2-0.2-1 % SOLN, Place 1 drop into both eyes 3 (three) times daily as needed (dry/irritated eyes.)., Disp: , Rfl:    loratadine  (CLARITIN ) 10 MG tablet, Take 10 mg by mouth daily as needed for allergies., Disp: , Rfl:    Multiple Vitamin (MULTIVITAMIN WITH MINERALS) TABS, Take 1 tablet by mouth daily. Centrum, Disp: , Rfl:    omeprazole (PRILOSEC) 20 MG capsule, Take 20 mg by mouth daily., Disp: , Rfl:    oxyCODONE -acetaminophen  (PERCOCET/ROXICET) 5-325 MG tablet, Take 1-2 tablets by mouth every 4 (four) hours as needed for severe pain., Disp: 60 tablet, Rfl: 0   Oxymetazoline HCl (SINEX LONG-ACTING NA), Place 1 spray into the nose 2 (two) times daily as needed (congestion)., Disp: , Rfl:    Probiotic CAPS, Take 1 capsule by mouth daily., Disp: , Rfl:    zinc  gluconate 50 MG tablet, Take 50 mg by mouth daily., Disp: , Rfl:    diclofenac Sodium (VOLTAREN) 1 % GEL, Apply 1 Application topically  4 (four) times daily as needed (pain). (Patient not taking: Reported on 09/02/2024), Disp: , Rfl:    diltiazem  (CARDIZEM ) 30 MG tablet, Take 1 tablet (30 mg total) by mouth every 6 (six) hours as needed. (Patient not taking: Reported on 09/02/2024), Disp: 40 tablet, Rfl: 2   Omega-3 Fatty Acids (FISH OIL) 1200 MG CAPS, Take 1,200 mg by mouth daily. (Patient not taking: Reported on 09/02/2024), Disp: , Rfl:   Consent:    NA  Disposition:   3 months   His questions and concerns were addressed to his satisfaction. He voices understanding of the recommendations provided during this encounter.    Signed, Madonna Michele HAS, [facility]  A Division of [facility] [address], [place], [place] [address]  09/02/2024 5:56 PM

## 2024-09-15 ENCOUNTER — Inpatient Hospital Stay: Attending: Hematology

## 2024-09-15 ENCOUNTER — Ambulatory Visit (HOSPITAL_COMMUNITY)
Admission: RE | Admit: 2024-09-15 | Discharge: 2024-09-15 | Disposition: A | Discharge status: Home / Self Care | Source: Ambulatory Visit | Attending: Hematology | Admitting: Hematology

## 2024-09-15 DIAGNOSIS — D509 Iron deficiency anemia, unspecified: Secondary | ICD-10-CM | POA: Insufficient documentation

## 2024-09-15 DIAGNOSIS — C180 Malignant neoplasm of cecum: Secondary | ICD-10-CM | POA: Insufficient documentation

## 2024-09-15 LAB — CBC WITH DIFFERENTIAL/PLATELET
Abs Immature Granulocytes: 0.01 K/uL (ref 0.00–0.07)
Basophils Absolute: 0.1 K/uL (ref 0.0–0.1)
Basophils Relative: 1 %
Eosinophils Absolute: 0.1 K/uL (ref 0.0–0.5)
Eosinophils Relative: 1 %
HCT: 53.4 % — ABNORMAL HIGH (ref 39.0–52.0)
Hemoglobin: 17.6 g/dL — ABNORMAL HIGH (ref 13.0–17.0)
Immature Granulocytes: 0 %
Lymphocytes Relative: 17 %
Lymphs Abs: 1 K/uL (ref 0.7–4.0)
MCH: 29.5 pg (ref 26.0–34.0)
MCHC: 33 g/dL (ref 30.0–36.0)
MCV: 89.4 fL (ref 80.0–100.0)
Monocytes Absolute: 0.4 K/uL (ref 0.1–1.0)
Monocytes Relative: 7 %
Neutro Abs: 4.6 K/uL (ref 1.7–7.7)
Neutrophils Relative %: 74 %
Platelets: 216 K/uL (ref 150–400)
RBC: 5.97 MIL/uL — ABNORMAL HIGH (ref 4.22–5.81)
RDW: 14.1 % (ref 11.5–15.5)
WBC: 6.1 K/uL (ref 4.0–10.5)
nRBC: 0 % (ref 0.0–0.2)

## 2024-09-15 LAB — IRON AND TIBC
Iron: 128 ug/dL (ref 45–182)
Saturation Ratios: 35 % (ref 17.9–39.5)
TIBC: 367 ug/dL (ref 250–450)
UIBC: 239 ug/dL

## 2024-09-15 LAB — COMPREHENSIVE METABOLIC PANEL WITH GFR
ALT: 18 U/L (ref 0–44)
AST: 25 U/L (ref 15–41)
Albumin: 4.3 g/dL (ref 3.5–5.0)
Alkaline Phosphatase: 69 U/L (ref 38–126)
Anion gap: 9 (ref 5–15)
BUN: 13 mg/dL (ref 8–23)
CO2: 27 mmol/L (ref 22–32)
Calcium: 9.5 mg/dL (ref 8.9–10.3)
Chloride: 102 mmol/L (ref 98–111)
Creatinine, Ser: 1.15 mg/dL (ref 0.61–1.24)
GFR, Estimated: >60 mL/min (ref >60)
Glucose, Bld: 94 mg/dL (ref 70–99)
Potassium: 4.4 mmol/L (ref 3.5–5.1)
Sodium: 138 mmol/L (ref 135–145)
Total Bilirubin: 1.2 mg/dL (ref 0.0–1.2)
Total Protein: 7.5 g/dL (ref 6.5–8.1)

## 2024-09-15 LAB — FERRITIN: Ferritin: 54 ng/mL (ref 24–336)

## 2024-09-15 MED ORDER — IOHEXOL 300 MG/ML  SOLN
100.0000 mL | Freq: Once | INTRAMUSCULAR | Status: AC | PRN
  Administered 2024-09-15: 100 mL via INTRAVENOUS

## 2024-09-16 LAB — CEA: CEA: 2.2 ng/mL (ref 0.0–4.7)

## 2024-09-23 ENCOUNTER — Inpatient Hospital Stay: Admitting: Oncology

## 2024-10-01 ENCOUNTER — Inpatient Hospital Stay: Attending: Hematology | Admitting: Oncology

## 2024-10-01 VITALS — BP 128/92 | HR 78 | Temp 98.0°F | Resp 18 | Ht 66.0 in | Wt 168.0 lb

## 2024-10-01 DIAGNOSIS — K449 Diaphragmatic hernia without obstruction or gangrene: Secondary | ICD-10-CM | POA: Insufficient documentation

## 2024-10-01 DIAGNOSIS — K5730 Diverticulosis of large intestine without perforation or abscess without bleeding: Secondary | ICD-10-CM | POA: Insufficient documentation

## 2024-10-01 DIAGNOSIS — D509 Iron deficiency anemia, unspecified: Secondary | ICD-10-CM | POA: Diagnosis not present

## 2024-10-01 DIAGNOSIS — R197 Diarrhea, unspecified: Secondary | ICD-10-CM | POA: Diagnosis not present

## 2024-10-01 DIAGNOSIS — D125 Benign neoplasm of sigmoid colon: Secondary | ICD-10-CM | POA: Diagnosis not present

## 2024-10-01 DIAGNOSIS — Z85038 Personal history of other malignant neoplasm of large intestine: Secondary | ICD-10-CM | POA: Diagnosis present

## 2024-10-01 DIAGNOSIS — Z9049 Acquired absence of other specified parts of digestive tract: Secondary | ICD-10-CM | POA: Insufficient documentation

## 2024-10-01 DIAGNOSIS — D123 Benign neoplasm of transverse colon: Secondary | ICD-10-CM | POA: Insufficient documentation

## 2024-10-01 DIAGNOSIS — C180 Malignant neoplasm of cecum: Secondary | ICD-10-CM | POA: Diagnosis not present

## 2024-10-01 DIAGNOSIS — Z7901 Long term (current) use of anticoagulants: Secondary | ICD-10-CM | POA: Insufficient documentation

## 2024-10-01 DIAGNOSIS — I4811 Longstanding persistent atrial fibrillation: Secondary | ICD-10-CM | POA: Diagnosis not present

## 2024-10-01 DIAGNOSIS — Z800 Family history of malignant neoplasm of digestive organs: Secondary | ICD-10-CM | POA: Diagnosis not present

## 2024-10-01 DIAGNOSIS — D508 Other iron deficiency anemias: Secondary | ICD-10-CM | POA: Diagnosis not present

## 2024-10-01 NOTE — Progress Notes (Unsigned)
 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  He has baseline diarrhea since surgery which is stable.  No bleeding per rectum or melena. - Reviewed labs from 09/15/2024: Normal LFTs.  CEA was normal. -CT scan from 09/15/2024 revealed prior right hemicolectomy and Kolek anastomosis.  No evidence of local recurrence or metastatic disease in the abdomen or pelvis.  Moderate to large hiatal hernia.  Colonic diverticulosis without evidence of acute diverticulitis and enlarged prostate gland.  Repeat CT scan in 1 year. - Patient had colonoscopy on 08/26/2024 with Dr. Cindie which showed nonbleeding internal hemorrhoids, diverticulosis in the sigmoid colon and descending colon several polyps in the transverse and sigmoid colon that were resected and sent for pathology.  Polyp in transverse colon was tubular adenoma and polyp in sigmoid colon was hyperplastic polyp.  3-year follow-up with colonoscopy recommended. - Recommend every 6 months instead of every 3 and annual imaging. - RTC 6 months for follow-up with repeat CTAP with contrast in 1 year.   Other iron  deficiency anemia - Continue iron  tablet twice weekly.  Ferritin is 54 and hemoglobin normal at 17.6.  Will continue monitoring intermittently.  -Discussed briefly elevated hemoglobin likely in the setting of dehydration.  Discussed drinking more fluids. Longstanding persistent atrial fibrillation (HCC) Currently wearing a heart monitor.  Has new cardiologist. Reports occasional chest pain or feels like his heart is doing a flop. Patient is on Eliquis  5 mg 2 times daily.  Tolerating well.  Has follow-up with cardiology in the next couple of weeks.  Orders Placed This Encounter  Procedures   CBC with Differential    Standing Status:   Future    Expected Date:   04/02/2025    Expiration Date:   07/01/2025    Comprehensive metabolic panel    Standing Status:   Future    Expected Date:   04/02/2025    Expiration Date:   07/01/2025   Iron  and TIBC ([facility] ONLY)    Standing Status:   Future    Expected Date:   04/02/2025    Expiration Date:   07/01/2025   Ferritin    Standing Status:   Future    Expected Date:   04/02/2025    Expiration Date:   07/01/2025   CEA    Standing Status:   Future    Expected Date:   04/02/2025    Expiration Date:   07/01/2025    INTERVAL HISTORY: Patient returns for follow-up for history of cecal cancer.  He presents today with his wife.  Overall, he is doing well.  Appetite and energy levels are 100%.  Denies any pain.  He is currently wearing a heart monitor for atrial fibrillation.  Reports he got a new cardiologist who is interested to see what his heart is doing.  He is on anticoagulation for this.  No bleeding, melena or hematochezia.  Patient had a colonoscopy in September with Dr. Cindie.  Reports everything appeared to be okay.  Denies any changes in his stools, or abdominal pain.  He is here to review his most recent CT scan and review his lab work.  We reviewed CBC, CMP, iron  panel, ferritin and CEA.  SUMMARY OF HEMATOLOGIC HISTORY: Oncology History Overview Note  1.  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 -  Received 2 units PRBC and 1 infusion of Ferrlecit to 50 mg.  Never had prior colonoscopy. - Colonoscopy (02/12/2023): Polypoid submucosal ulcerated nonobstructing large mass in the cecum, partially circumferential. - EGD with (02/12/2023): 6 cm hiatal hernia.  Normal duodenal bulb, first part and second part of duodenum. - Pathology: Cecal mass biopsy-adenocarcinoma - CT CAP (02/16/2023): Subtle hypodensity within the central segment 4 measuring 14 mm in the liver.  Prominent lymph node in the ileocolic mesentery measures 4 mm.  No other evidence of metastatic disease. - MRI liver  (02/25/2023): No solid liver abnormality seen. - Right partial colectomy with terminal ileum resection on 04/27/2023 - Pathology: Moderately differentiated invasive carcinoma, margins negative, negative LVI/PNI, 0/21 lymph nodes involved, pT3 N0, MMR preserved   2.  Social/family history: - Lives at home with his wife.  He had Guillain-Barr syndrome at age 24.  He has bilateral knee pains and uses a cane to ambulate.  He is independent of ADLs and IADLs.  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  Quit smoking cigarettes more than 30 years ago. - Brother had colon cancer.  Father had abdominal cancer.  Nephew died of cancer.  Maternal grandfather had    No history exists.     CBC    Component Value Date/Time   WBC 6.1 09/15/2024 1254   RBC 5.97 (H) 09/15/2024 1254   HGB 17.6 (H) 09/15/2024 1254   HGB 15.0 07/11/2023 1440   HCT 53.4 (H) 09/15/2024 1254   HCT 47.0 07/11/2023 1440   PLT 216 09/15/2024 1254   MCV 89.4 09/15/2024 1254   MCV 82 07/11/2023 1440   MCH 29.5 09/15/2024 1254   MCHC 33.0 09/15/2024 1254   RDW 14.1 09/15/2024 1254   RDW 13.6 07/11/2023 1440   LYMPHSABS 1.0 09/15/2024 1254   LYMPHSABS 1.6 07/11/2023 1440   MONOABS 0.4 09/15/2024 1254   EOSABS 0.1 09/15/2024 1254   EOSABS 0.1 07/11/2023 1440   BASOSABS 0.1 09/15/2024 1254   BASOSABS 0.1 07/11/2023 1440       Latest Ref Rng & Units 09/15/2024   12:54 PM 06/17/2024    2:00 PM 03/05/2024    2:54 PM  CMP  Glucose 70 - 99 mg/dL 94  89  899   BUN 8 - 23 mg/dL 13  12  13    Creatinine 0.61 - 1.24 mg/dL 8.84  8.87  8.86   Sodium 135 - 145 mmol/L 138  141  137   Potassium 3.5 - 5.1 mmol/L 4.4  4.1  4.1   Chloride 98 - 111 mmol/L 102  107  102   CO2 22 - 32 mmol/L 27  23  25    Calcium 8.9 - 10.3 mg/dL 9.5  9.5  9.7   Total Protein 6.5 - 8.1 g/dL 7.5  7.1  7.4   Total Bilirubin 0.0 - 1.2 mg/dL 1.2  1.0  0.7   Alkaline Phos 38 - 126 U/L 69  67  77   AST 15 - 41 U/L 25  25  24    ALT 0 - 44 U/L 18   18  24       Lab Results  Component Value Date   FERRITIN 54 09/15/2024   VITAMINB12 675 02/08/2023    Vitals:   10/01/24 1424  BP: (!) 128/92  Pulse: 78  Resp: 18  Temp: 98 F (36.7 C)  SpO2: 100%    Review of System:  Review of Systems  Cardiovascular:  Positive for  chest pain.    Physical Exam: Physical Exam Constitutional:      Appearance: Normal appearance.  HENT:     Head: Normocephalic and atraumatic.  Eyes:     Pupils: Pupils are equal, round, and reactive to light.  Cardiovascular:     Rate and Rhythm: Normal rate and regular rhythm.     Heart sounds: Normal heart sounds. No murmur heard. Pulmonary:     Effort: Pulmonary effort is normal.     Breath sounds: Normal breath sounds. No wheezing.  Abdominal:     General: Bowel sounds are normal. There is no distension.     Palpations: Abdomen is soft.     Tenderness: There is no abdominal tenderness.  Musculoskeletal:        General: Normal range of motion.     Cervical back: Normal range of motion.  Skin:    General: Skin is warm and dry.     Findings: No rash.  Neurological:     Mental Status: He is alert and oriented to person, place, and time.     Gait: Gait is intact.  Psychiatric:        Mood and Affect: Mood and affect normal.        Cognition and Memory: Memory normal.        Judgment: Judgment normal.      I spent 25 minutes dedicated to the care of this patient (face-to-face and non-face-to-face) on the date of the encounter to include what is described in the assessment and plan.,  Delon Hope, NP 10/02/2024 1:36 PM

## 2024-10-01 NOTE — Assessment & Plan Note (Addendum)
-   He denies any change in bowel habits.  He has baseline diarrhea since surgery which is stable.  No bleeding per rectum or melena. - Reviewed labs from 09/15/2024: Normal LFTs.  CEA was normal. -CT scan from 09/15/2024 revealed prior right hemicolectomy and Kolek anastomosis.  No evidence of local recurrence or metastatic disease in the abdomen or pelvis.  Moderate to large hiatal hernia.  Colonic diverticulosis without evidence of acute diverticulitis and enlarged prostate gland.  Repeat CT scan in 1 year. - Patient had colonoscopy on 08/26/2024 with Dr. Cindie which showed nonbleeding internal hemorrhoids, diverticulosis in the sigmoid colon and descending colon several polyps in the transverse and sigmoid colon that were resected and sent for pathology.  Polyp in transverse colon was tubular adenoma and polyp in sigmoid colon was hyperplastic polyp.  3-year follow-up with colonoscopy recommended. - Recommend every 6 months instead of every 3 and annual imaging. - RTC 6 months for follow-up with repeat CTAP with contrast in 1 year.

## 2024-10-01 NOTE — Assessment & Plan Note (Addendum)
-   Continue iron  tablet twice weekly.  Ferritin is 54 and hemoglobin normal at 17.6.  Will continue monitoring intermittently.  -Discussed briefly elevated hemoglobin likely in the setting of dehydration.  Discussed drinking more fluids.

## 2024-10-02 ENCOUNTER — Encounter: Payer: Self-pay | Admitting: Oncology

## 2024-11-09 ENCOUNTER — Ambulatory Visit: Payer: Self-pay | Admitting: Cardiology

## 2024-11-09 DIAGNOSIS — I480 Paroxysmal atrial fibrillation: Secondary | ICD-10-CM

## 2024-12-02 ENCOUNTER — Ambulatory Visit: Admitting: Cardiology

## 2024-12-08 NOTE — Telephone Encounter (Signed)
 Left detailed message per DPR. Relayed Dr. Tyree note. All concerns addressed.

## 2025-02-25 ENCOUNTER — Ambulatory Visit: Admitting: Cardiology

## 2025-04-01 ENCOUNTER — Inpatient Hospital Stay

## 2025-04-08 ENCOUNTER — Inpatient Hospital Stay: Admitting: Oncology
